# Patient Record
Sex: Female | Born: 1945 | Race: White | Hispanic: No | Smoking: Never smoker
Health system: Southern US, Community
[De-identification: ages and names within clinical notes are randomized; demographics above are authoritative.]

## PROBLEM LIST (undated history)

## (undated) DIAGNOSIS — E079 Disorder of thyroid, unspecified: Secondary | ICD-10-CM

## (undated) DIAGNOSIS — Z9889 Other specified postprocedural states: Secondary | ICD-10-CM

## (undated) DIAGNOSIS — K635 Polyp of colon: Secondary | ICD-10-CM

## (undated) DIAGNOSIS — T7840XA Allergy, unspecified, initial encounter: Secondary | ICD-10-CM

## (undated) DIAGNOSIS — R55 Syncope and collapse: Secondary | ICD-10-CM

## (undated) DIAGNOSIS — M1990 Unspecified osteoarthritis, unspecified site: Secondary | ICD-10-CM

## (undated) DIAGNOSIS — N390 Urinary tract infection, site not specified: Secondary | ICD-10-CM

## (undated) DIAGNOSIS — I10 Essential (primary) hypertension: Secondary | ICD-10-CM

## (undated) DIAGNOSIS — R112 Nausea with vomiting, unspecified: Secondary | ICD-10-CM

## (undated) HISTORY — PX: COLONOSCOPY: SHX174

## (undated) HISTORY — DX: Essential (primary) hypertension: I10

## (undated) HISTORY — DX: Allergy, unspecified, initial encounter: T78.40XA

## (undated) HISTORY — DX: Disorder of thyroid, unspecified: E07.9

## (undated) HISTORY — PX: SINUS IRRIGATION: SHX2411

## (undated) HISTORY — PX: TONSILLECTOMY: SUR1361

## (undated) HISTORY — DX: Syncope and collapse: R55

## (undated) HISTORY — DX: Polyp of colon: K63.5

## (undated) HISTORY — DX: Unspecified osteoarthritis, unspecified site: M19.90

## (undated) HISTORY — DX: Urinary tract infection, site not specified: N39.0

---

## 1999-03-07 ENCOUNTER — Other Ambulatory Visit: Admission: RE | Admit: 1999-03-07 | Discharge: 1999-03-07 | Payer: Self-pay | Admitting: Obstetrics and Gynecology

## 2000-06-03 ENCOUNTER — Other Ambulatory Visit: Admission: RE | Admit: 2000-06-03 | Discharge: 2000-06-03 | Payer: Self-pay | Admitting: Obstetrics and Gynecology

## 2001-03-30 ENCOUNTER — Encounter: Payer: Self-pay | Admitting: Emergency Medicine

## 2001-03-30 ENCOUNTER — Emergency Department (HOSPITAL_COMMUNITY): Admission: EM | Admit: 2001-03-30 | Discharge: 2001-03-30 | Payer: Self-pay | Admitting: Plastic Surgery

## 2001-04-02 ENCOUNTER — Emergency Department (HOSPITAL_COMMUNITY): Admission: EM | Admit: 2001-04-02 | Discharge: 2001-04-02 | Payer: Self-pay | Admitting: Emergency Medicine

## 2007-04-29 ENCOUNTER — Emergency Department (HOSPITAL_COMMUNITY): Admission: EM | Admit: 2007-04-29 | Discharge: 2007-04-29 | Payer: Self-pay | Admitting: Emergency Medicine

## 2007-06-04 ENCOUNTER — Encounter (INDEPENDENT_AMBULATORY_CARE_PROVIDER_SITE_OTHER): Payer: Self-pay | Admitting: Diagnostic Radiology

## 2007-06-04 ENCOUNTER — Encounter: Admission: RE | Admit: 2007-06-04 | Discharge: 2007-06-04 | Payer: Self-pay | Admitting: Family Medicine

## 2007-06-04 ENCOUNTER — Other Ambulatory Visit: Admission: RE | Admit: 2007-06-04 | Discharge: 2007-06-04 | Payer: Self-pay | Admitting: Diagnostic Radiology

## 2009-03-03 ENCOUNTER — Encounter: Admission: RE | Admit: 2009-03-03 | Discharge: 2009-03-03 | Payer: Self-pay | Admitting: Family Medicine

## 2009-12-21 ENCOUNTER — Encounter: Admission: RE | Admit: 2009-12-21 | Discharge: 2009-12-21 | Payer: Self-pay | Admitting: Family Medicine

## 2010-03-20 ENCOUNTER — Encounter: Admission: RE | Admit: 2010-03-20 | Discharge: 2010-03-20 | Payer: Self-pay | Admitting: Family Medicine

## 2010-05-12 ENCOUNTER — Other Ambulatory Visit: Payer: Self-pay | Admitting: Gastroenterology

## 2010-05-12 DIAGNOSIS — K862 Cyst of pancreas: Secondary | ICD-10-CM

## 2010-05-23 ENCOUNTER — Ambulatory Visit
Admission: RE | Admit: 2010-05-23 | Discharge: 2010-05-23 | Disposition: A | Discharge status: Home / Self Care | Payer: MEDICARE | Source: Ambulatory Visit | Attending: Gastroenterology | Admitting: Gastroenterology

## 2010-05-23 DIAGNOSIS — K862 Cyst of pancreas: Secondary | ICD-10-CM

## 2010-12-11 ENCOUNTER — Other Ambulatory Visit: Payer: Self-pay | Admitting: Obstetrics and Gynecology

## 2010-12-14 ENCOUNTER — Other Ambulatory Visit: Payer: Self-pay | Admitting: Obstetrics and Gynecology

## 2010-12-14 DIAGNOSIS — R928 Other abnormal and inconclusive findings on diagnostic imaging of breast: Secondary | ICD-10-CM

## 2010-12-20 ENCOUNTER — Ambulatory Visit
Admission: RE | Admit: 2010-12-20 | Discharge: 2010-12-20 | Disposition: A | Discharge status: Home / Self Care | Payer: Medicare Other | Source: Ambulatory Visit | Attending: Obstetrics and Gynecology | Admitting: Obstetrics and Gynecology

## 2010-12-20 DIAGNOSIS — R928 Other abnormal and inconclusive findings on diagnostic imaging of breast: Secondary | ICD-10-CM

## 2011-01-10 LAB — DIFFERENTIAL
Lymphs Abs: 0.8
Monocytes Absolute: 0.3
Monocytes Relative: 3
Neutro Abs: 9.5 — ABNORMAL HIGH
Neutrophils Relative %: 90 — ABNORMAL HIGH

## 2011-01-10 LAB — COMPREHENSIVE METABOLIC PANEL
AST: 28
Albumin: 3.5
Alkaline Phosphatase: 54
BUN: 12
Chloride: 101
Creatinine, Ser: 0.67
Potassium: 3.4 — ABNORMAL LOW
Total Bilirubin: 0.7
Total Protein: 6.7

## 2011-01-10 LAB — URINALYSIS, ROUTINE W REFLEX MICROSCOPIC
Bilirubin Urine: NEGATIVE
Glucose, UA: NEGATIVE
Hgb urine dipstick: NEGATIVE
Specific Gravity, Urine: 1.017

## 2011-01-10 LAB — CBC
Hemoglobin: 12.6
MCHC: 34.4
Platelets: 301
RDW: 12.4

## 2011-02-28 ENCOUNTER — Other Ambulatory Visit: Payer: Self-pay | Admitting: Family Medicine

## 2011-02-28 DIAGNOSIS — E049 Nontoxic goiter, unspecified: Secondary | ICD-10-CM

## 2011-03-19 ENCOUNTER — Ambulatory Visit
Admission: RE | Admit: 2011-03-19 | Discharge: 2011-03-19 | Disposition: A | Discharge status: Home / Self Care | Payer: Medicare Other | Source: Ambulatory Visit | Attending: Family Medicine | Admitting: Family Medicine

## 2011-03-19 DIAGNOSIS — E049 Nontoxic goiter, unspecified: Secondary | ICD-10-CM

## 2011-07-31 ENCOUNTER — Other Ambulatory Visit: Payer: Self-pay | Admitting: Gastroenterology

## 2011-07-31 DIAGNOSIS — K862 Cyst of pancreas: Secondary | ICD-10-CM

## 2011-08-27 ENCOUNTER — Ambulatory Visit
Admission: RE | Admit: 2011-08-27 | Discharge: 2011-08-27 | Disposition: A | Discharge status: Home / Self Care | Payer: Medicare Other | Source: Ambulatory Visit | Attending: Gastroenterology | Admitting: Gastroenterology

## 2011-08-27 DIAGNOSIS — K862 Cyst of pancreas: Secondary | ICD-10-CM

## 2011-08-27 MED ORDER — GADOBENATE DIMEGLUMINE 529 MG/ML IV SOLN
11.0000 mL | Freq: Once | INTRAVENOUS | Status: AC | PRN
  Administered 2011-08-27: 11 mL via INTRAVENOUS

## 2012-01-28 ENCOUNTER — Other Ambulatory Visit (HOSPITAL_COMMUNITY)
Admission: RE | Admit: 2012-01-28 | Discharge: 2012-01-28 | Disposition: A | Discharge status: Home / Self Care | Payer: Medicare Other | Source: Ambulatory Visit | Attending: Family Medicine | Admitting: Family Medicine

## 2012-01-28 ENCOUNTER — Other Ambulatory Visit: Payer: Self-pay | Admitting: Family Medicine

## 2012-01-28 DIAGNOSIS — Z124 Encounter for screening for malignant neoplasm of cervix: Secondary | ICD-10-CM | POA: Insufficient documentation

## 2012-02-07 ENCOUNTER — Other Ambulatory Visit: Payer: Self-pay | Admitting: Family Medicine

## 2012-02-07 DIAGNOSIS — Z1231 Encounter for screening mammogram for malignant neoplasm of breast: Secondary | ICD-10-CM

## 2012-02-07 DIAGNOSIS — M8580 Other specified disorders of bone density and structure, unspecified site: Secondary | ICD-10-CM

## 2012-03-12 ENCOUNTER — Ambulatory Visit
Admission: RE | Admit: 2012-03-12 | Discharge: 2012-03-12 | Disposition: A | Discharge status: Home / Self Care | Payer: Medicare Other | Source: Ambulatory Visit | Attending: Family Medicine | Admitting: Family Medicine

## 2012-03-12 DIAGNOSIS — Z1231 Encounter for screening mammogram for malignant neoplasm of breast: Secondary | ICD-10-CM

## 2012-03-12 DIAGNOSIS — M8580 Other specified disorders of bone density and structure, unspecified site: Secondary | ICD-10-CM

## 2012-03-18 ENCOUNTER — Other Ambulatory Visit: Payer: Self-pay | Admitting: Family Medicine

## 2012-03-18 DIAGNOSIS — R928 Other abnormal and inconclusive findings on diagnostic imaging of breast: Secondary | ICD-10-CM

## 2012-03-31 ENCOUNTER — Ambulatory Visit
Admission: RE | Admit: 2012-03-31 | Discharge: 2012-03-31 | Disposition: A | Discharge status: Home / Self Care | Payer: Medicare Other | Source: Ambulatory Visit | Attending: Family Medicine | Admitting: Family Medicine

## 2012-03-31 DIAGNOSIS — R928 Other abnormal and inconclusive findings on diagnostic imaging of breast: Secondary | ICD-10-CM

## 2013-02-23 ENCOUNTER — Other Ambulatory Visit: Payer: Self-pay | Admitting: Family Medicine

## 2013-02-23 DIAGNOSIS — N6323 Unspecified lump in the left breast, lower outer quadrant: Secondary | ICD-10-CM

## 2013-02-23 DIAGNOSIS — E049 Nontoxic goiter, unspecified: Secondary | ICD-10-CM

## 2013-03-15 ENCOUNTER — Ambulatory Visit
Admission: RE | Admit: 2013-03-15 | Discharge: 2013-03-15 | Disposition: A | Discharge status: Home / Self Care | Payer: Medicare Other | Source: Ambulatory Visit | Attending: Family Medicine | Admitting: Family Medicine

## 2013-03-15 DIAGNOSIS — N6323 Unspecified lump in the left breast, lower outer quadrant: Secondary | ICD-10-CM

## 2013-04-26 ENCOUNTER — Other Ambulatory Visit: Payer: Medicare Other

## 2013-05-24 ENCOUNTER — Other Ambulatory Visit: Payer: Medicare Other

## 2013-12-23 ENCOUNTER — Other Ambulatory Visit: Payer: Self-pay | Admitting: Family Medicine

## 2013-12-23 DIAGNOSIS — N6002 Solitary cyst of left breast: Secondary | ICD-10-CM

## 2014-03-28 ENCOUNTER — Ambulatory Visit
Admission: RE | Admit: 2014-03-28 | Discharge: 2014-03-28 | Disposition: A | Discharge status: Home / Self Care | Payer: Medicare HMO | Source: Ambulatory Visit | Attending: Family Medicine | Admitting: Family Medicine

## 2014-03-28 DIAGNOSIS — N6002 Solitary cyst of left breast: Secondary | ICD-10-CM

## 2014-09-02 ENCOUNTER — Ambulatory Visit (INDEPENDENT_AMBULATORY_CARE_PROVIDER_SITE_OTHER): Payer: Medicare HMO | Admitting: Internal Medicine

## 2014-09-02 ENCOUNTER — Encounter: Payer: Self-pay | Admitting: Internal Medicine

## 2014-09-02 VITALS — BP 110/80 | HR 66 | Temp 97.9°F | Resp 16 | Ht 64.0 in | Wt 128.0 lb

## 2014-09-02 DIAGNOSIS — R232 Flushing: Secondary | ICD-10-CM

## 2014-09-02 DIAGNOSIS — Z0000 Encounter for general adult medical examination without abnormal findings: Secondary | ICD-10-CM | POA: Insufficient documentation

## 2014-09-02 NOTE — Progress Notes (Signed)
Pre visit review using our clinic review tool, if applicable. No additional management support is needed unless otherwise documented below in the visit note. 

## 2014-09-02 NOTE — Patient Instructions (Signed)
We will get the records from your previous doctor and update our records.   No need for blood work today. We can see you back next year for a check up. If you have any problems or need refills before then please feel free to call the office.   Health Maintenance Adopting a healthy lifestyle and getting preventive care can go a long way to promote health and wellness. Talk with your health care provider about what schedule of regular examinations is right for you. This is a good chance for you to check in with your provider about disease prevention and staying healthy. In between checkups, there are plenty of things you can do on your own. Experts have done a lot of research about which lifestyle changes and preventive measures are most likely to keep you healthy. Ask your health care provider for more information. WEIGHT AND DIET  Eat a healthy diet  Be sure to include plenty of vegetables, fruits, low-fat dairy products, and lean protein.  Do not eat a lot of foods high in solid fats, added sugars, or salt.  Get regular exercise. This is one of the most important things you can do for your health.  Most adults should exercise for at least 150 minutes each week. The exercise should increase your heart rate and make you sweat (moderate-intensity exercise).  Most adults should also do strengthening exercises at least twice a week. This is in addition to the moderate-intensity exercise.  Maintain a healthy weight  Body mass index (BMI) is a measurement that can be used to identify possible weight problems. It estimates body fat based on height and weight. Your health care provider can help determine your BMI and help you achieve or maintain a healthy weight.  For females 76 years of age and older:   A BMI below 18.5 is considered underweight.  A BMI of 18.5 to 24.9 is normal.  A BMI of 25 to 29.9 is considered overweight.  A BMI of 30 and above is considered obese.  Watch levels of  cholesterol and blood lipids  You should start having your blood tested for lipids and cholesterol at 70 years of age, then have this test every 5 years.  You may need to have your cholesterol levels checked more often if:  Your lipid or cholesterol levels are high.  You are older than 70 years of age.  You are at high risk for heart disease.  CANCER SCREENING   Lung Cancer  Lung cancer screening is recommended for adults 47-27 years old who are at high risk for lung cancer because of a history of smoking.  A yearly low-dose CT scan of the lungs is recommended for people who:  Currently smoke.  Have quit within the past 15 years.  Have at least a 30-pack-year history of smoking. A pack year is smoking an average of one pack of cigarettes a day for 1 year.  Yearly screening should continue until it has been 15 years since you quit.  Yearly screening should stop if you develop a health problem that would prevent you from having lung cancer treatment.  Breast Cancer  Practice breast self-awareness. This means understanding how your breasts normally appear and feel.  It also means doing regular breast self-exams. Let your health care provider know about any changes, no matter how small.  If you are in your 20s or 30s, you should have a clinical breast exam (CBE) by a health care provider every 1-3 years  as part of a regular health exam.  If you are 65 or older, have a CBE every year. Also consider having a breast X-ray (mammogram) every year.  If you have a family history of breast cancer, talk to your health care provider about genetic screening.  If you are at high risk for breast cancer, talk to your health care provider about having an MRI and a mammogram every year.  Breast cancer gene (BRCA) assessment is recommended for women who have family members with BRCA-related cancers. BRCA-related cancers include:  Breast.  Ovarian.  Tubal.  Peritoneal  cancers.  Results of the assessment will determine the need for genetic counseling and BRCA1 and BRCA2 testing. Cervical Cancer Routine pelvic examinations to screen for cervical cancer are no longer recommended for nonpregnant women who are considered low risk for cancer of the pelvic organs (ovaries, uterus, and vagina) and who do not have symptoms. A pelvic examination may be necessary if you have symptoms including those associated with pelvic infections. Ask your health care provider if a screening pelvic exam is right for you.   The Pap test is the screening test for cervical cancer for women who are considered at risk.  If you had a hysterectomy for a problem that was not cancer or a condition that could lead to cancer, then you no longer need Pap tests.  If you are older than 65 years, and you have had normal Pap tests for the past 10 years, you no longer need to have Pap tests.  If you have had past treatment for cervical cancer or a condition that could lead to cancer, you need Pap tests and screening for cancer for at least 20 years after your treatment.  If you no longer get a Pap test, assess your risk factors if they change (such as having a new sexual partner). This can affect whether you should start being screened again.  Some women have medical problems that increase their chance of getting cervical cancer. If this is the case for you, your health care provider may recommend more frequent screening and Pap tests.  The human papillomavirus (HPV) test is another test that may be used for cervical cancer screening. The HPV test looks for the virus that can cause cell changes in the cervix. The cells collected during the Pap test can be tested for HPV.  The HPV test can be used to screen women 64 years of age and older. Getting tested for HPV can extend the interval between normal Pap tests from three to five years.  An HPV test also should be used to screen women of any age who  have unclear Pap test results.  After 70 years of age, women should have HPV testing as often as Pap tests.  Colorectal Cancer  This type of cancer can be detected and often prevented.  Routine colorectal cancer screening usually begins at 70 years of age and continues through 70 years of age.  Your health care provider may recommend screening at an earlier age if you have risk factors for colon cancer.  Your health care provider may also recommend using home test kits to check for hidden blood in the stool.  A small camera at the end of a tube can be used to examine your colon directly (sigmoidoscopy or colonoscopy). This is done to check for the earliest forms of colorectal cancer.  Routine screening usually begins at age 57.  Direct examination of the colon should be repeated every  5-10 years through 70 years of age. However, you may need to be screened more often if early forms of precancerous polyps or small growths are found. Skin Cancer  Check your skin from head to toe regularly.  Tell your health care provider about any new moles or changes in moles, especially if there is a change in a mole's shape or color.  Also tell your health care provider if you have a mole that is larger than the size of a pencil eraser.  Always use sunscreen. Apply sunscreen liberally and repeatedly throughout the day.  Protect yourself by wearing long sleeves, pants, a wide-brimmed hat, and sunglasses whenever you are outside. HEART DISEASE, DIABETES, AND HIGH BLOOD PRESSURE   Have your blood pressure checked at least every 1-2 years. High blood pressure causes heart disease and increases the risk of stroke.  If you are between 86 years and 74 years old, ask your health care provider if you should take aspirin to prevent strokes.  Have regular diabetes screenings. This involves taking a blood sample to check your fasting blood sugar level.  If you are at a normal weight and have a low risk for  diabetes, have this test once every three years after 70 years of age.  If you are overweight and have a high risk for diabetes, consider being tested at a younger age or more often. PREVENTING INFECTION  Hepatitis B  If you have a higher risk for hepatitis B, you should be screened for this virus. You are considered at high risk for hepatitis B if:  You were born in a country where hepatitis B is common. Ask your health care provider which countries are considered high risk.  Your parents were born in a high-risk country, and you have not been immunized against hepatitis B (hepatitis B vaccine).  You have HIV or AIDS.  You use needles to inject street drugs.  You live with someone who has hepatitis B.  You have had sex with someone who has hepatitis B.  You get hemodialysis treatment.  You take certain medicines for conditions, including cancer, organ transplantation, and autoimmune conditions. Hepatitis C  Blood testing is recommended for:  Everyone born from [birth year] through 1966.  Anyone with known risk factors for hepatitis C. Sexually transmitted infections (STIs)  You should be screened for sexually transmitted infections (STIs) including gonorrhea and chlamydia if:  You are sexually active and are younger than 70 years of age.  You are older than 70 years of age and your health care provider tells you that you are at risk for this type of infection.  Your sexual activity has changed since you were last screened and you are at an increased risk for chlamydia or gonorrhea. Ask your health care provider if you are at risk.  If you do not have HIV, but are at risk, it may be recommended that you take a prescription medicine daily to prevent HIV infection. This is called pre-exposure prophylaxis (PrEP). You are considered at risk if:  You are sexually active and do not regularly use condoms or know the HIV status of your partner(s).  You take drugs by injection.  You are  sexually active with a partner who has HIV. Talk with your health care provider about whether you are at high risk of being infected with HIV. If you choose to begin PrEP, you should first be tested for HIV. You should then be tested every 3 months for as long as you  are taking PrEP.  PREGNANCY   If you are premenopausal and you may become pregnant, ask your health care provider about preconception counseling.  If you may become pregnant, take 400 to 800 micrograms (mcg) of folic acid every day.  If you want to prevent pregnancy, talk to your health care provider about birth control (contraception). OSTEOPOROSIS AND MENOPAUSE   Osteoporosis is a disease in which the bones lose minerals and strength with aging. This can result in serious bone fractures. Your risk for osteoporosis can be identified using a bone density scan.  If you are 66 years of age or older, or if you are at risk for osteoporosis and fractures, ask your health care provider if you should be screened.  Ask your health care provider whether you should take a calcium or vitamin D supplement to lower your risk for osteoporosis.  Menopause may have certain physical symptoms and risks.  Hormone replacement therapy may reduce some of these symptoms and risks. Talk to your health care provider about whether hormone replacement therapy is right for you.  HOME CARE INSTRUCTIONS   Schedule regular health, dental, and eye exams.  Stay current with your immunizations.   Do not use any tobacco products including cigarettes, chewing tobacco, or electronic cigarettes.  If you are pregnant, do not drink alcohol.  If you are breastfeeding, limit how much and how often you drink alcohol.  Limit alcohol intake to no more than 1 drink per day for nonpregnant women. One drink equals 12 ounces of beer, 5 ounces of wine, or 1 ounces of hard liquor.  Do not use street drugs.  Do not share needles.  Ask your health care provider for  help if you need support or information about quitting drugs.  Tell your health care provider if you often feel depressed.  Tell your health care provider if you have ever been abused or do not feel safe at home. Document Released: 10/22/2010 Document Revised: 08/23/2013 Document Reviewed: 03/10/2013 [facility] Patient Information 2015 [place], [place]. This information is not intended to replace advice given to you by your health care provider. Make sure you discuss any questions you have with your health care provider.

## 2014-09-02 NOTE — Assessment & Plan Note (Signed)
Up to date on colon cancer screening, pneumonia shot given at previous doctor on 05/26/14. Will get records to update the rest. Talked with her about sun safety and skin cancer prevention.

## 2014-09-02 NOTE — Assessment & Plan Note (Signed)
Takes hormones about 3-4 days per week. Gets yearly mammogram.

## 2014-09-02 NOTE — Progress Notes (Signed)
   Subjective:    Patient ID: Margaret Craig, female    DOB: 07-09-1945, 69 y.o.   MRN: [id number]  HPI Here for medicare wellness, no new complaints. Please see A/P for status and treatment of chronic medical problems.   Diet: heart healthy Physical activity: active Depression/mood screen: negative Hearing: intact to whispered voice Visual acuity: grossly normal, performs annual eye exam  ADLs: capable Fall risk: none Home safety: good Cognitive evaluation: intact to orientation, naming, recall and repetition EOL planning: adv directives discussed  I have personally reviewed and have noted 1. The patient's medical and social history - reviewed today no changes 2. Their use of alcohol, tobacco or illicit drugs 3. Their current medications and supplements 4. The patient's functional ability including ADL's, fall risks, home safety risks and hearing or visual impairment. 5. Diet and physical activities 6. Evidence for depression or mood disorders 7. Care team reviewed and updated (available in snapshot)  Review of Systems  Constitutional: Negative for fever, activity change, appetite change, fatigue and unexpected weight change.  HENT: Negative.   Eyes: Negative.   Respiratory: Negative for cough, chest tightness, shortness of breath and wheezing.   Cardiovascular: Negative for chest pain, palpitations and leg swelling.  Gastrointestinal: Negative for nausea, abdominal pain, diarrhea, constipation and abdominal distention.  Musculoskeletal: Negative.   Skin: Negative.   Neurological: Negative.   Psychiatric/Behavioral: Negative.       Objective:   Physical Exam  Constitutional: She is oriented to person, place, and time. She appears well-developed and well-nourished.  HENT:  Head: Normocephalic and atraumatic.  Eyes: EOM are normal.  Neck: Normal range of motion.  Cardiovascular: Normal rate and regular rhythm.   No murmur heard. Pulmonary/Chest: Effort normal and  breath sounds normal. No respiratory distress. She has no wheezes.  Abdominal: Soft. Bowel sounds are normal. She exhibits no distension. There is no tenderness.  Musculoskeletal: She exhibits no edema.  Neurological: She is alert and oriented to person, place, and time. Coordination normal.  Skin: Skin is warm and dry.  Psychiatric: She has a normal mood and affect.   Filed Vitals:   09/02/14 0901  BP: 110/80  Pulse: 66  Temp: 97.9 F (36.6 C)  TempSrc: Oral  Resp: 16  Height: 5\' 4"  (1.626 m)  Weight: 128 lb (58.06 kg)  SpO2: 99%      Assessment & Plan:

## 2014-11-14 ENCOUNTER — Telehealth: Payer: Self-pay | Admitting: Internal Medicine

## 2014-11-14 NOTE — Telephone Encounter (Signed)
Received records from [place] [facility] forwarded 48 pages to Dr. Doug Sou 11/14/14 [facility]

## 2015-03-07 DIAGNOSIS — Z23 Encounter for immunization: Secondary | ICD-10-CM | POA: Diagnosis not present

## 2015-05-29 ENCOUNTER — Ambulatory Visit (INDEPENDENT_AMBULATORY_CARE_PROVIDER_SITE_OTHER): Payer: Medicare HMO | Admitting: Internal Medicine

## 2015-05-29 ENCOUNTER — Encounter: Payer: Self-pay | Admitting: Internal Medicine

## 2015-05-29 ENCOUNTER — Other Ambulatory Visit (INDEPENDENT_AMBULATORY_CARE_PROVIDER_SITE_OTHER): Payer: Medicare HMO

## 2015-05-29 VITALS — BP 118/70 | HR 66 | Temp 98.2°F | Resp 12 | Ht 64.0 in | Wt 110.0 lb

## 2015-05-29 DIAGNOSIS — Z0000 Encounter for general adult medical examination without abnormal findings: Secondary | ICD-10-CM

## 2015-05-29 LAB — COMPREHENSIVE METABOLIC PANEL
ALK PHOS: 46 U/L (ref 39–117)
ALT: 18 U/L (ref 0–35)
AST: 18 U/L (ref 0–37)
Albumin: 4.2 g/dL (ref 3.5–5.2)
BUN: 20 mg/dL (ref 6–23)
CO2: 30 mEq/L (ref 19–32)
Calcium: 10.1 mg/dL (ref 8.4–10.5)
Chloride: 104 mEq/L (ref 96–112)
Creatinine, Ser: 0.8 mg/dL (ref 0.40–1.20)
GFR: 75.28 mL/min (ref >60.00)
Glucose, Bld: 101 mg/dL — ABNORMAL HIGH (ref 70–99)
Potassium: 4.9 mEq/L (ref 3.5–5.1)
Sodium: 142 mEq/L (ref 135–145)
TOTAL PROTEIN: 7.1 g/dL (ref 6.0–8.3)
Total Bilirubin: 0.4 mg/dL (ref 0.2–1.2)

## 2015-05-29 LAB — LIPID PANEL
Cholesterol: 217 mg/dL — ABNORMAL HIGH (ref 0–200)
HDL: 90.9 mg/dL (ref >39.00)
LDL CALC: 112 mg/dL — AB (ref 0–99)
NONHDL: 126.37
Total CHOL/HDL Ratio: 2
Triglycerides: 74 mg/dL (ref 0.0–149.0)
VLDL: 14.8 mg/dL (ref 0.0–40.0)

## 2015-05-29 LAB — HEPATITIS C ANTIBODY: HCV Ab: NEGATIVE

## 2015-05-29 LAB — MAGNESIUM: Magnesium: 2.6 mg/dL — ABNORMAL HIGH (ref 1.5–2.5)

## 2015-05-29 NOTE — Progress Notes (Signed)
Pre visit review using our clinic review tool, if applicable. No additional management support is needed unless otherwise documented below in the visit note. 

## 2015-05-29 NOTE — Patient Instructions (Signed)
We are checking the blood work today and will call you back with the results.   Try taking a tums before bed to help with the cramps.   If you want to try something for sleep some people take a benadryl at bedtime or you can try melatonin over the counter. Either is safe to try for sleep.   Health Maintenance, Female Adopting a healthy lifestyle and getting preventive care can go a long way to promote health and wellness. Talk with your health care provider about what schedule of regular examinations is right for you. This is a good chance for you to check in with your provider about disease prevention and staying healthy. In between checkups, there are plenty of things you can do on your own. Experts have done a lot of research about which lifestyle changes and preventive measures are most likely to keep you healthy. Ask your health care provider for more information. WEIGHT AND DIET  Eat a healthy diet  Be sure to include plenty of vegetables, fruits, low-fat dairy products, and lean protein.  Do not eat a lot of foods high in solid fats, added sugars, or salt.  Get regular exercise. This is one of the most important things you can do for your health.  Most adults should exercise for at least 150 minutes each week. The exercise should increase your heart rate and make you sweat (moderate-intensity exercise).  Most adults should also do strengthening exercises at least twice a week. This is in addition to the moderate-intensity exercise.  Maintain a healthy weight  Body mass index (BMI) is a measurement that can be used to identify possible weight problems. It estimates body fat based on height and weight. Your health care provider can help determine your BMI and help you achieve or maintain a healthy weight.  For females 70 years of age and older:   A BMI below 18.5 is considered underweight.  A BMI of 18.5 to 24.9 is normal.  A BMI of 25 to 29.9 is considered overweight.  A BMI  of 30 and above is considered obese.  Watch levels of cholesterol and blood lipids  You should start having your blood tested for lipids and cholesterol at 71 years of age, then have this test every 5 years.  You may need to have your cholesterol levels checked more often if:  Your lipid or cholesterol levels are high.  You are older than 71 years of age.  You are at high risk for heart disease.  CANCER SCREENING   Lung Cancer  Lung cancer screening is recommended for adults 65-40 years old who are at high risk for lung cancer because of a history of smoking.  A yearly low-dose CT scan of the lungs is recommended for people who:  Currently smoke.  Have quit within the past 15 years.  Have at least a 30-pack-year history of smoking. A pack year is smoking an average of one pack of cigarettes a day for 1 year.  Yearly screening should continue until it has been 15 years since you quit.  Yearly screening should stop if you develop a health problem that would prevent you from having lung cancer treatment.  Breast Cancer  Practice breast self-awareness. This means understanding how your breasts normally appear and feel.  It also means doing regular breast self-exams. Let your health care provider know about any changes, no matter how small.  If you are in your 20s or 30s, you should have a  clinical breast exam (CBE) by a health care provider every 1-3 years as part of a regular health exam.  If you are 40 or older, have a CBE every year. Also consider having a breast X-ray (mammogram) every year.  If you have a family history of breast cancer, talk to your health care provider about genetic screening.  If you are at high risk for breast cancer, talk to your health care provider about having an MRI and a mammogram every year.  Breast cancer gene (BRCA) assessment is recommended for women who have family members with BRCA-related cancers. BRCA-related cancers  include:  Breast.  Ovarian.  Tubal.  Peritoneal cancers.  Results of the assessment will determine the need for genetic counseling and BRCA1 and BRCA2 testing. Cervical Cancer Your health care provider may recommend that you be screened regularly for cancer of the pelvic organs (ovaries, uterus, and vagina). This screening involves a pelvic examination, including checking for microscopic changes to the surface of your cervix (Pap test). You may be encouraged to have this screening done every 3 years, beginning at age 21.  For women ages 30-65, health care providers may recommend pelvic exams and Pap testing every 3 years, or they may recommend the Pap and pelvic exam, combined with testing for human papilloma virus (HPV), every 5 years. Some types of HPV increase your risk of cervical cancer. Testing for HPV may also be done on women of any age with unclear Pap test results.  Other health care providers may not recommend any screening for nonpregnant women who are considered low risk for pelvic cancer and who do not have symptoms. Ask your health care provider if a screening pelvic exam is right for you.  If you have had past treatment for cervical cancer or a condition that could lead to cancer, you need Pap tests and screening for cancer for at least 20 years after your treatment. If Pap tests have been discontinued, your risk factors (such as having a new sexual partner) need to be reassessed to determine if screening should resume. Some women have medical problems that increase the chance of getting cervical cancer. In these cases, your health care provider may recommend more frequent screening and Pap tests. Colorectal Cancer  This type of cancer can be detected and often prevented.  Routine colorectal cancer screening usually begins at 71 years of age and continues through 71 years of age.  Your health care provider may recommend screening at an earlier age if you have risk factors for  colon cancer.  Your health care provider may also recommend using home test kits to check for hidden blood in the stool.  A small camera at the end of a tube can be used to examine your colon directly (sigmoidoscopy or colonoscopy). This is done to check for the earliest forms of colorectal cancer.  Routine screening usually begins at age 50.  Direct examination of the colon should be repeated every 5-10 years through 71 years of age. However, you may need to be screened more often if early forms of precancerous polyps or small growths are found. Skin Cancer  Check your skin from head to toe regularly.  Tell your health care provider about any new moles or changes in moles, especially if there is a change in a mole's shape or color.  Also tell your health care provider if you have a mole that is larger than the size of a pencil eraser.  Always use sunscreen. Apply sunscreen liberally   and repeatedly throughout the day.  Protect yourself by wearing long sleeves, pants, a wide-brimmed hat, and sunglasses whenever you are outside. HEART DISEASE, DIABETES, AND HIGH BLOOD PRESSURE   High blood pressure causes heart disease and increases the risk of stroke. High blood pressure is more likely to develop in:  People who have blood pressure in the high end of the normal range (130-139/85-89 mm Hg).  People who are overweight or obese.  People who are African American.  If you are 68-18 years of age, have your blood pressure checked every 3-5 years. If you are 96 years of age or older, have your blood pressure checked every year. You should have your blood pressure measured twice--once when you are at a hospital or clinic, and once when you are not at a hospital or clinic. Record the average of the two measurements. To check your blood pressure when you are not at a hospital or clinic, you can use:  An automated blood pressure machine at a pharmacy.  A home blood pressure monitor.  If you  are between 52 years and 37 years old, ask your health care provider if you should take aspirin to prevent strokes.  Have regular diabetes screenings. This involves taking a blood sample to check your fasting blood sugar level.  If you are at a normal weight and have a low risk for diabetes, have this test once every three years after 71 years of age.  If you are overweight and have a high risk for diabetes, consider being tested at a younger age or more often. PREVENTING INFECTION  Hepatitis B  If you have a higher risk for hepatitis B, you should be screened for this virus. You are considered at high risk for hepatitis B if:  You were born in a country where hepatitis B is common. Ask your health care provider which countries are considered high risk.  Your parents were born in a high-risk country, and you have not been immunized against hepatitis B (hepatitis B vaccine).  You have HIV or AIDS.  You use needles to inject street drugs.  You live with someone who has hepatitis B.  You have had sex with someone who has hepatitis B.  You get hemodialysis treatment.  You take certain medicines for conditions, including cancer, organ transplantation, and autoimmune conditions. Hepatitis C  Blood testing is recommended for:  Everyone born from [birth year] through 1966.  Anyone with known risk factors for hepatitis C. Sexually transmitted infections (STIs)  You should be screened for sexually transmitted infections (STIs) including gonorrhea and chlamydia if:  You are sexually active and are younger than 71 years of age.  You are older than 71 years of age and your health care provider tells you that you are at risk for this type of infection.  Your sexual activity has changed since you were last screened and you are at an increased risk for chlamydia or gonorrhea. Ask your health care provider if you are at risk.  If you do not have HIV, but are at risk, it may be recommended that you  take a prescription medicine daily to prevent HIV infection. This is called pre-exposure prophylaxis (PrEP). You are considered at risk if:  You are sexually active and do not regularly use condoms or know the HIV status of your partner(s).  You take drugs by injection.  You are sexually active with a partner who has HIV. Talk with your health care provider about whether you are  at high risk of being infected with HIV. If you choose to begin PrEP, you should first be tested for HIV. You should then be tested every 3 months for as long as you are taking PrEP.  PREGNANCY   If you are premenopausal and you may become pregnant, ask your health care provider about preconception counseling.  If you may become pregnant, take 400 to 800 micrograms (mcg) of folic acid every day.  If you want to prevent pregnancy, talk to your health care provider about birth control (contraception). OSTEOPOROSIS AND MENOPAUSE   Osteoporosis is a disease in which the bones lose minerals and strength with aging. This can result in serious bone fractures. Your risk for osteoporosis can be identified using a bone density scan.  If you are 56 years of age or older, or if you are at risk for osteoporosis and fractures, ask your health care provider if you should be screened.  Ask your health care provider whether you should take a calcium or vitamin D supplement to lower your risk for osteoporosis.  Menopause may have certain physical symptoms and risks.  Hormone replacement therapy may reduce some of these symptoms and risks. Talk to your health care provider about whether hormone replacement therapy is right for you.  HOME CARE INSTRUCTIONS   Schedule regular health, dental, and eye exams.  Stay current with your immunizations.   Do not use any tobacco products including cigarettes, chewing tobacco, or electronic cigarettes.  If you are pregnant, do not drink alcohol.  If you are breastfeeding, limit how  much and how often you drink alcohol.  Limit alcohol intake to no more than 1 drink per day for nonpregnant women. One drink equals 12 ounces of beer, 5 ounces of wine, or 1 ounces of hard liquor.  Do not use street drugs.  Do not share needles.  Ask your health care provider for help if you need support or information about quitting drugs.  Tell your health care provider if you often feel depressed.  Tell your health care provider if you have ever been abused or do not feel safe at home.   This information is not intended to replace advice given to you by your health care provider. Make sure you discuss any questions you have with your health care provider.   Document Released: 10/22/2010 Document Revised: 04/29/2014 Document Reviewed: 03/10/2013 Elsevier Interactive Patient Education [employer].

## 2015-05-29 NOTE — Progress Notes (Signed)
   Subjective:    Patient ID: Margaret Craig, female    DOB: [birth year], 70 y.o.   MRN: [id number]  HPI Here for medicare wellness, no new complaints. Please see A/P for status and treatment of chronic medical problems. Her husband passed away over the last summer and she is still struggling to adjust.   Diet: heart healthy  Physical activity: sedentary Depression/mood screen: negative Hearing: intact to whispered voice Visual acuity: grossly normal, performs annual eye exam  ADLs: capable Fall risk: none Home safety: good Cognitive evaluation: intact to orientation, naming, recall and repetition EOL planning: adv directives discussed, in place  I have personally reviewed and have noted 1. The patient's medical and social history - reviewed today no changes 2. Their use of alcohol, tobacco or illicit drugs 3. Their current medications and supplements 4. The patient's functional ability including ADL's, fall risks, home safety risks and hearing or visual impairment. 5. Diet and physical activities 6. Evidence for depression or mood disorders 7. Care team reviewed and updated (available in snapshot)  Review of Systems  Constitutional: Negative for fever, activity change, appetite change, fatigue and unexpected weight change.  HENT: Negative.   Eyes: Negative.   Respiratory: Negative for cough, chest tightness, shortness of breath and wheezing.   Cardiovascular: Negative for chest pain, palpitations and leg swelling.  Gastrointestinal: Negative for nausea, abdominal pain, diarrhea, constipation and abdominal distention.  Musculoskeletal: Negative.   Skin: Negative.   Neurological: Negative.   Psychiatric/Behavioral: Negative.       Objective:   Physical Exam  Constitutional: She is oriented to person, place, and time. She appears well-developed and well-nourished.  Thin  HENT:  Head: Normocephalic and atraumatic.  Eyes: EOM are normal.  Neck: Normal range of motion.    Cardiovascular: Normal rate and regular rhythm.   No murmur heard. Pulmonary/Chest: Effort normal and breath sounds normal. No respiratory distress. She has no wheezes.  Abdominal: Soft. Bowel sounds are normal. She exhibits no distension. There is no tenderness.  Musculoskeletal: She exhibits no edema.  Neurological: She is alert and oriented to person, place, and time. Coordination normal.  Skin: Skin is warm and dry.  Psychiatric: She has a normal mood and affect.   Filed Vitals:   05/29/15 0933  BP: 118/70  Pulse: 66  Temp: 98.2 F (36.8 C)  TempSrc: Oral  Resp: 12  Height: 5\' 4"  (1.626 m)  Weight: 110 lb (49.896 kg)  SpO2: 98%      Assessment & Plan:

## 2015-05-29 NOTE — Assessment & Plan Note (Signed)
Talked to her about her weight and she is working on gaining some back (lost due to stress). Colonoscopy up to date and she will get mammogram in the next year. Declines any immunizations today but already had flu shot. Checking labs, counseled her about sun protection and she sees dermatology yearly. Given 10 year screening recommendations.

## 2015-05-30 ENCOUNTER — Telehealth: Payer: Self-pay | Admitting: General Practice

## 2015-05-30 NOTE — Telephone Encounter (Signed)
Tried to call patient at both numbers.  No answer and can't leave a message.  Lab results mailed to patient.

## 2015-06-22 ENCOUNTER — Other Ambulatory Visit: Payer: Self-pay

## 2015-06-22 DIAGNOSIS — Z1231 Encounter for screening mammogram for malignant neoplasm of breast: Secondary | ICD-10-CM

## 2015-08-01 ENCOUNTER — Ambulatory Visit
Admission: RE | Admit: 2015-08-01 | Discharge: 2015-08-01 | Disposition: A | Discharge status: Home / Self Care | Payer: Medicare HMO | Source: Ambulatory Visit

## 2015-08-01 DIAGNOSIS — Z1231 Encounter for screening mammogram for malignant neoplasm of breast: Secondary | ICD-10-CM | POA: Diagnosis not present

## 2015-08-03 DIAGNOSIS — R69 Illness, unspecified: Secondary | ICD-10-CM | POA: Diagnosis not present

## 2015-08-17 ENCOUNTER — Ambulatory Visit (INDEPENDENT_AMBULATORY_CARE_PROVIDER_SITE_OTHER): Payer: Medicare HMO | Admitting: Internal Medicine

## 2015-08-17 ENCOUNTER — Encounter: Payer: Self-pay | Admitting: Internal Medicine

## 2015-08-17 VITALS — BP 130/82 | HR 66 | Temp 97.9°F | Resp 14 | Ht 64.0 in | Wt 106.0 lb

## 2015-08-17 DIAGNOSIS — R1033 Periumbilical pain: Secondary | ICD-10-CM | POA: Diagnosis not present

## 2015-08-17 NOTE — Progress Notes (Signed)
Pre visit review using our clinic review tool, if applicable. No additional management support is needed unless otherwise documented below in the visit note. 

## 2015-08-17 NOTE — Patient Instructions (Signed)
We will get the CT scan of the stomach to see if there is any other cause for the pain other than the hernia that we felt.   We will call you back with the results.   Avoiding excessive lifting to prevent the hernia from growing.   Ventral Hernia A ventral hernia (also called an incisional hernia) is a hernia that occurs at the site of a previous surgical cut (incision) in the abdomen. The abdominal wall spans from your lower chest down to your pelvis. If the abdominal wall is weakened from a surgical incision, a hernia can occur. A hernia is a bulge of bowel or muscle tissue pushing out on the weakened part of the abdominal wall. Ventral hernias can get bigger from straining or lifting. Obese and older people are at higher risk for a ventral hernia. People who develop infections after surgery or require repeat incisions at the same site on the abdomen are also at increased risk. CAUSES  A ventral hernia occurs because of weakness in the abdominal wall at an incision site.  SYMPTOMS  Common symptoms include:  A visible bulge or lump on the abdominal wall.  Pain or tenderness around the lump.  Increased discomfort if you cough or make a sudden movement. If the hernia has blocked part of the intestine, a serious complication can occur (incarcerated or strangulated hernia). This can become a problem that requires emergency surgery because the blood flow to the blocked intestine may be cut off. Symptoms may include:  Feeling sick to your stomach (nauseous).  Throwing up (vomiting).  Stomach swelling (distention) or bloating.  Fever.  Rapid heartbeat. DIAGNOSIS  Your health care provider will take a medical history and perform a physical exam. Various tests may be ordered, such as:  Blood tests.  Urine tests.  Ultrasonography.  X-rays.  Computed tomography (CT). TREATMENT  Watchful waiting may be all that is needed for a smaller hernia that does not cause symptoms. Your health  care provider may recommend the use of a supportive belt (truss) that helps to keep the abdominal wall intact. For larger hernias or those that cause pain, surgery to repair the hernia is usually recommended. If a hernia becomes strangulated, emergency surgery needs to be done right away. HOME CARE INSTRUCTIONS  Avoid putting pressure or strain on the abdominal area.  Avoid heavy lifting.  Use good body positioning for physical tasks. Ask your health care provider about proper body positioning.  Use a supportive belt as directed by your health care provider.  Maintain a healthy weight.  Eat foods that are high in fiber, such as whole grains, fruits, and vegetables. Fiber helps prevent difficult bowel movements (constipation).  Drink enough fluids to keep your urine clear or pale yellow.  Follow up with your health care provider as directed. SEEK MEDICAL CARE IF:   Your hernia seems to be getting larger or more painful. SEEK IMMEDIATE MEDICAL CARE IF:   You have abdominal pain that is sudden and sharp.  Your pain becomes severe.  You have repeated vomiting.  You are sweating a lot.  You notice a rapid heartbeat.  You develop a fever. MAKE SURE YOU:   Understand these instructions.  Will watch your condition.  Will get help right away if you are not doing well or get worse.   This information is not intended to replace advice given to you by your health care provider. Make sure you discuss any questions you have with your health care  provider.   Document Released: 03/25/2012 Document Revised: 04/29/2014 Document Reviewed: 03/25/2012 Elsevier Interactive Patient Education [employer].

## 2015-08-19 DIAGNOSIS — R1033 Periumbilical pain: Secondary | ICD-10-CM | POA: Insufficient documentation

## 2015-08-19 NOTE — Progress Notes (Signed)
   Subjective:    Patient ID: Margaret Craig, female    DOB: 01/12/1946, 70 y.o.   MRN: [id number]  HPI The patient is a 70 YO female coming in for umbilical pain. She was doing some heavy lifting last week when she felt some tearing. She is having pain in the area since that time. No bulging with coughing or bending. No nausea or vomiting. No diarrhea or constipation. No skin rash. Has tried tylenol which was not helpful. Pain 6/10.   Review of Systems  Constitutional: Negative for fever, activity change, appetite change, fatigue and unexpected weight change.  HENT: Negative.   Eyes: Negative.   Respiratory: Negative for cough, chest tightness, shortness of breath and wheezing.   Cardiovascular: Negative for chest pain, palpitations and leg swelling.  Gastrointestinal: Positive for abdominal pain. Negative for nausea, diarrhea, constipation and abdominal distention.  Musculoskeletal: Negative.   Skin: Negative.   Neurological: Negative.   Psychiatric/Behavioral: Negative.       Objective:   Physical Exam  Constitutional: She is oriented to person, place, and time. She appears well-developed and well-nourished.  Thin  HENT:  Head: Normocephalic and atraumatic.  Eyes: EOM are normal.  Neck: Normal range of motion.  Cardiovascular: Normal rate and regular rhythm.   No murmur heard. Pulmonary/Chest: Effort normal and breath sounds normal. No respiratory distress. She has no wheezes.  Abdominal: Soft. Bowel sounds are normal. She exhibits no distension. There is tenderness.  Tenderness around the umbilicus and mild bulge with cough  Musculoskeletal: She exhibits no edema.  Neurological: She is alert and oriented to person, place, and time. Coordination normal.  Skin: Skin is warm and dry.  Psychiatric: She has a normal mood and affect.   Filed Vitals:   08/17/15 1519  BP: 130/82  Pulse: 66  Temp: 97.9 F (36.6 C)  TempSrc: Oral  Resp: 14  Height: 5\' 4"  (1.626 m)  Weight:  106 lb (48.081 kg)  SpO2: 99%      Assessment & Plan:

## 2015-08-19 NOTE — Assessment & Plan Note (Signed)
CT abdomen without contrast to evaluate for torn muscle versus hernia. No definite hernia on exam. Advised to use heating pad and aleve if needed for pain.

## 2015-08-30 ENCOUNTER — Ambulatory Visit
Admission: RE | Admit: 2015-08-30 | Discharge: 2015-08-30 | Disposition: A | Discharge status: Home / Self Care | Payer: Medicare HMO | Source: Ambulatory Visit | Attending: Internal Medicine | Admitting: Internal Medicine

## 2015-08-30 DIAGNOSIS — K439 Ventral hernia without obstruction or gangrene: Secondary | ICD-10-CM | POA: Diagnosis not present

## 2015-08-30 DIAGNOSIS — R1033 Periumbilical pain: Secondary | ICD-10-CM

## 2015-09-06 ENCOUNTER — Telehealth: Payer: Self-pay | Admitting: Internal Medicine

## 2015-09-06 NOTE — Telephone Encounter (Signed)
-----   Message from [facility] sent at 09/06/2015  4:45 PM EDT ----- Patient is calling for results. Please advise in Dr. Nathanial Millman absence, thanks.  ----- Message -----    From: Rad Results In Interface    Sent: 08/30/2015   8:40 AM      To: Hoyt Koch, MD

## 2015-09-06 NOTE — Telephone Encounter (Signed)
Pt called in and would like someone to give her call with her CT Scan results

## 2015-09-06 NOTE — Telephone Encounter (Signed)
Please inform patient her CT scan shows a small 2 cm hernia which appears previously existing and has not changed previous imaging. Otherwise there is no indication for her abdominal pain based on the CT scan.

## 2015-09-07 NOTE — Telephone Encounter (Signed)
Patient aware.

## 2015-09-08 ENCOUNTER — Ambulatory Visit (INDEPENDENT_AMBULATORY_CARE_PROVIDER_SITE_OTHER): Payer: Medicare HMO | Admitting: Internal Medicine

## 2015-09-08 ENCOUNTER — Encounter: Payer: Self-pay | Admitting: Internal Medicine

## 2015-09-08 VITALS — BP 102/62 | HR 75 | Temp 98.4°F | Ht 64.0 in | Wt 107.1 lb

## 2015-09-08 DIAGNOSIS — H6983 Other specified disorders of Eustachian tube, bilateral: Secondary | ICD-10-CM | POA: Diagnosis not present

## 2015-09-08 MED ORDER — METHYLPREDNISOLONE 4 MG PO TBPK: ORAL_TABLET | ORAL | Status: DC

## 2015-09-08 MED ORDER — FLUTICASONE PROPIONATE 50 MCG/ACT NA SUSP: 2.0000 | Freq: Every day | NASAL | Status: DC

## 2015-09-08 NOTE — Progress Notes (Signed)
Subjective:    Patient ID: Margaret Craig, female    DOB: 1945-10-12, 69 y.o.   MRN: [id number]  HPI She is here for an acute visit.   For the past week her ears have been clogged. She did have severe right ear pain initially, but that resolved.  She was outside doing a lot of yard work and was exposed to a lot of pollen and she wondered if that was the cause.  She started taking claritin and it has not helped.  She did fly over one month ago and had a similar feeling, but resolved after a day. She denies any ear pain at this time. She denies any discharge, change in hearing, sore throat, fevers or other cold symptoms. She has tried popping them on her own, but has not helped.   Medications and allergies reviewed with patient and updated if appropriate.  Patient Active Problem List   Diagnosis Date Noted  . Umbilical pain 123456  . Hot flashes 09/02/2014  . Routine general medical examination at a health care facility 09/02/2014    Current Outpatient Prescriptions on File Prior to Visit  Medication Sig Dispense Refill  . aspirin 81 MG tablet Take 81 mg by mouth daily.    . Calcium Carb-Cholecalciferol (CALCIUM 600 + D PO) Take by mouth.    . estradiol (ESTRACE) 0.5 MG tablet Take 0.5 mg by mouth daily.   12  . medroxyPROGESTERone (PROVERA) 2.5 MG tablet Take 2.5 mg by mouth daily.   12  . Multiple Vitamins-Minerals (MULTIVITAMIN WITH MINERALS) tablet Take 1 tablet by mouth daily.     No current facility-administered medications on file prior to visit.    Past Medical History  Diagnosis Date  . Fainting spell   . Thyroid disease   . UTI (urinary tract infection)     Past Surgical History  Procedure Laterality Date  . Tonsillectomy    . Sinus irrigation      Social History   Social History  . Marital Status: Married    Spouse Name: N/A  . Number of Children: N/A  . Years of Education: N/A   Social History Main Topics  . Smoking status: Never Smoker   .  Smokeless tobacco: None  . Alcohol Use: 3.6 oz/week    6 Standard drinks or equivalent per week  . Drug Use: None  . Sexual Activity: Not Asked   Other Topics Concern  . None   Social History Narrative    Family History  Problem Relation Age of Onset  . Mental illness Mother   . Alcohol abuse Son   . Mental illness Son   . Cancer Maternal Aunt     breast  . Heart disease Maternal Aunt   . Heart disease Paternal Aunt   . Alcohol abuse Paternal Uncle   . Cancer Maternal Grandmother     breast  . Heart disease Maternal Grandfather     Review of Systems  Constitutional: Negative for fever.  HENT: Negative for congestion, ear discharge, ear pain, hearing loss, sinus pressure and sore throat.        Objective:   Filed Vitals:   09/08/15 1304  BP: 102/62  Pulse: 75  Temp: 98.4 F (36.9 C)   Filed Weights   09/08/15 1304  Weight: 107 lb 2 oz (48.592 kg)   Body mass index is 18.38 kg/(m^2).   Physical Exam GENERAL APPEARANCE: Appears stated age, well appearing, NAD EYES: conjunctiva clear, no icterus  HEENT: hearing grossly normal, bilateral tympanic membranes and ear canals normal, oropharynx with no erythema, no thyromegaly, trachea midline, no cervical or supraclavicular lymphadenopathy EXTREMITIES: Without clubbing, cyanosis, or edema      Assessment & Plan:   Bilateral eustachian tube dysfunction Ear exam is normal Continue Claritin daily Start Flonase once daily Medrol Dosepak-take as directed. Discussed possible side effects and if she experiences any advised her to call or stop the medication gentley try to pop ear If no improvement she will let us know .

## 2015-09-08 NOTE — Patient Instructions (Signed)
Continue to take the claritin.  Start using flonase once a day and take the steroid as directed.    If there is no improvement or if you've any side effects from the medications please call.

## 2015-09-08 NOTE — Progress Notes (Signed)
Pre visit review using our clinic review tool, if applicable. No additional management support is needed unless otherwise documented below in the visit note. 

## 2015-09-20 ENCOUNTER — Telehealth: Payer: Self-pay

## 2015-09-20 DIAGNOSIS — H6980 Other specified disorders of Eustachian tube, unspecified ear: Secondary | ICD-10-CM

## 2015-09-20 NOTE — Telephone Encounter (Signed)
Patient called and said her ears still feel stopped up. She states you said if no better to call back and you could advise her on what to do next. Please follow up. Thank you.

## 2015-09-21 NOTE — Telephone Encounter (Signed)
I did not see her for this but can recommend sinus rinses with saline to help clear congestion. Continue taking flonase and can switch to zyrtec which is slightly stronger.

## 2015-09-21 NOTE — Telephone Encounter (Signed)
Patient would like to go to an ENT doctor and she said that you told her if her condition didn't improve that you would refer her. Please advise, thanks.

## 2015-09-21 NOTE — Telephone Encounter (Signed)
Referral ordered

## 2015-09-21 NOTE — Telephone Encounter (Signed)
Left message on voice mail informing patient on what she could try to help her ears.

## 2015-09-21 NOTE — Addendum Note (Signed)
Addended by: Binnie Rail on: 09/21/2015 04:28 PM   Modules accepted: Orders

## 2015-10-27 DIAGNOSIS — H938X3 Other specified disorders of ear, bilateral: Secondary | ICD-10-CM | POA: Insufficient documentation

## 2016-02-21 DIAGNOSIS — Z23 Encounter for immunization: Secondary | ICD-10-CM | POA: Diagnosis not present

## 2016-03-20 DIAGNOSIS — L814 Other melanin hyperpigmentation: Secondary | ICD-10-CM | POA: Diagnosis not present

## 2016-03-20 DIAGNOSIS — D3617 Benign neoplasm of peripheral nerves and autonomic nervous system of trunk, unspecified: Secondary | ICD-10-CM | POA: Diagnosis not present

## 2016-03-20 DIAGNOSIS — D2262 Melanocytic nevi of left upper limb, including shoulder: Secondary | ICD-10-CM | POA: Diagnosis not present

## 2016-03-20 DIAGNOSIS — L821 Other seborrheic keratosis: Secondary | ICD-10-CM | POA: Diagnosis not present

## 2016-03-20 DIAGNOSIS — D2271 Melanocytic nevi of right lower limb, including hip: Secondary | ICD-10-CM | POA: Diagnosis not present

## 2016-03-20 DIAGNOSIS — D1801 Hemangioma of skin and subcutaneous tissue: Secondary | ICD-10-CM | POA: Diagnosis not present

## 2016-03-20 DIAGNOSIS — D2272 Melanocytic nevi of left lower limb, including hip: Secondary | ICD-10-CM | POA: Diagnosis not present

## 2016-03-20 DIAGNOSIS — L570 Actinic keratosis: Secondary | ICD-10-CM | POA: Diagnosis not present

## 2016-03-20 DIAGNOSIS — L819 Disorder of pigmentation, unspecified: Secondary | ICD-10-CM | POA: Diagnosis not present

## 2016-03-20 DIAGNOSIS — D225 Melanocytic nevi of trunk: Secondary | ICD-10-CM | POA: Diagnosis not present

## 2016-03-20 DIAGNOSIS — D2261 Melanocytic nevi of right upper limb, including shoulder: Secondary | ICD-10-CM | POA: Diagnosis not present

## 2016-05-01 DIAGNOSIS — R69 Illness, unspecified: Secondary | ICD-10-CM | POA: Diagnosis not present

## 2016-05-29 ENCOUNTER — Ambulatory Visit (INDEPENDENT_AMBULATORY_CARE_PROVIDER_SITE_OTHER): Payer: Medicare HMO | Admitting: Internal Medicine

## 2016-05-29 ENCOUNTER — Encounter: Payer: Self-pay | Admitting: Internal Medicine

## 2016-05-29 ENCOUNTER — Other Ambulatory Visit (INDEPENDENT_AMBULATORY_CARE_PROVIDER_SITE_OTHER): Payer: Medicare HMO

## 2016-05-29 VITALS — BP 112/70 | HR 57 | Temp 97.7°F | Ht 64.0 in | Wt 108.0 lb

## 2016-05-29 DIAGNOSIS — Z0000 Encounter for general adult medical examination without abnormal findings: Secondary | ICD-10-CM

## 2016-05-29 DIAGNOSIS — R232 Flushing: Secondary | ICD-10-CM | POA: Diagnosis not present

## 2016-05-29 LAB — COMPREHENSIVE METABOLIC PANEL
ALT: 15 U/L (ref 0–35)
AST: 16 U/L (ref 0–37)
Albumin: 4.2 g/dL (ref 3.5–5.2)
Alkaline Phosphatase: 51 U/L (ref 39–117)
BUN: 19 mg/dL (ref 6–23)
CO2: 31 mEq/L (ref 19–32)
Calcium: 9.8 mg/dL (ref 8.4–10.5)
Chloride: 105 mEq/L (ref 96–112)
Creatinine, Ser: 0.77 mg/dL (ref 0.40–1.20)
GFR: 78.45 mL/min (ref >60.00)
GLUCOSE: 92 mg/dL (ref 70–99)
POTASSIUM: 4.6 meq/L (ref 3.5–5.1)
Sodium: 142 mEq/L (ref 135–145)
TOTAL PROTEIN: 7 g/dL (ref 6.0–8.3)
Total Bilirubin: 0.5 mg/dL (ref 0.2–1.2)

## 2016-05-29 LAB — LIPID PANEL
Cholesterol: 221 mg/dL — ABNORMAL HIGH (ref 0–200)
HDL: 90.1 mg/dL (ref >39.00)
LDL Cholesterol: 113 mg/dL — ABNORMAL HIGH (ref 0–99)
NONHDL: 130.47
Total CHOL/HDL Ratio: 2
Triglycerides: 87 mg/dL (ref 0.0–149.0)
VLDL: 17.4 mg/dL (ref 0.0–40.0)

## 2016-05-29 LAB — CBC
HEMATOCRIT: 40.7 % (ref 36.0–46.0)
HEMOGLOBIN: 13.7 g/dL (ref 12.0–15.0)
MCHC: 33.8 g/dL (ref 30.0–36.0)
MCV: 98.8 fl (ref 78.0–100.0)
Platelets: 257 10*3/uL (ref 150.0–400.0)
RBC: 4.11 Mil/uL (ref 3.87–5.11)
RDW: 12.6 % (ref 11.5–15.5)
WBC: 3.5 10*3/uL — ABNORMAL LOW (ref 4.0–10.5)

## 2016-05-29 NOTE — Progress Notes (Signed)
   Subjective:    Patient ID: GEET EHMAN, female    DOB: 10-31-1945, 71 y.o.   MRN: [id number]  HPI Here for medicare wellness and physical, no new complaints. Please see A/P for status and treatment of chronic medical problems.   Diet: heart healthy  Physical activity: active Depression/mood screen: negative Hearing: intact to whispered voice Visual acuity: grossly normal, performs annual eye exam  ADLs: capable Fall risk: none Home safety: good Cognitive evaluation: intact to orientation, naming, recall and repetition EOL planning: adv directives discussed  I have personally reviewed and have noted 1. The patient's medical and social history - reviewed today no changes 2. Their use of alcohol, tobacco or illicit drugs 3. Their current medications and supplements 4. The patient's functional ability including ADL's, fall risks, home safety risks and hearing or visual impairment. 5. Diet and physical activities 6. Evidence for depression or mood disorders 7. Care team reviewed and updated (available in snapshot)  Review of Systems  Constitutional: Negative.   HENT: Negative.   Eyes: Negative.   Respiratory: Negative for cough, chest tightness and shortness of breath.   Cardiovascular: Negative for chest pain, palpitations and leg swelling.  Gastrointestinal: Negative for abdominal distention, abdominal pain, constipation, diarrhea, nausea and vomiting.  Musculoskeletal: Negative.   Skin: Negative.   Neurological: Negative.   Psychiatric/Behavioral: Negative.       Objective:   Physical Exam  Constitutional: She is oriented to person, place, and time. She appears well-developed and well-nourished.  HENT:  Head: Normocephalic and atraumatic.  Eyes: EOM are normal.  Neck: Normal range of motion.  Cardiovascular: Normal rate and regular rhythm.   Pulmonary/Chest: Effort normal and breath sounds normal. No respiratory distress. She has no wheezes. She has no rales.    Abdominal: Soft. Bowel sounds are normal. She exhibits no distension. There is no tenderness. There is no rebound.  Musculoskeletal: She exhibits no edema.  Neurological: She is alert and oriented to person, place, and time. Coordination normal.  Skin: Skin is warm and dry.  Psychiatric: She has a normal mood and affect.   Vitals:   05/29/16 0934  BP: 112/70  Pulse: (!) 57  Temp: 97.7 F (36.5 C)  TempSrc: Oral  SpO2: 98%  Weight: 108 lb (49 kg)  Height: 5\' 4"  (1.626 m)      Assessment & Plan:

## 2016-05-29 NOTE — Patient Instructions (Signed)
We will check the labs today and call you back.   The shingrix is the new shingles shot so watch for this to be available and consider getting if insurance will cover.   Health Maintenance, Female Introduction Adopting a healthy lifestyle and getting preventive care can go a long way to promote health and wellness. Talk with your health care provider about what schedule of regular examinations is right for you. This is a good chance for you to check in with your provider about disease prevention and staying healthy. In between checkups, there are plenty of things you can do on your own. Experts have done a lot of research about which lifestyle changes and preventive measures are most likely to keep you healthy. Ask your health care provider for more information. Weight and diet Eat a healthy diet  Be sure to include plenty of vegetables, fruits, low-fat dairy products, and lean protein.  Do not eat a lot of foods high in solid fats, added sugars, or salt.  Get regular exercise. This is one of the most important things you can do for your health.  Most adults should exercise for at least 150 minutes each week. The exercise should increase your heart rate and make you sweat (moderate-intensity exercise).  Most adults should also do strengthening exercises at least twice a week. This is in addition to the moderate-intensity exercise. Maintain a healthy weight  Body mass index (BMI) is a measurement that can be used to identify possible weight problems. It estimates body fat based on height and weight. Your health care provider can help determine your BMI and help you achieve or maintain a healthy weight.  For females 10 years of age and older:  A BMI below 18.5 is considered underweight.  A BMI of 18.5 to 24.9 is normal.  A BMI of 25 to 29.9 is considered overweight.  A BMI of 30 and above is considered obese. Watch levels of cholesterol and blood lipids  You should start having your  blood tested for lipids and cholesterol at 72 years of age, then have this test every 5 years.  You may need to have your cholesterol levels checked more often if:  Your lipid or cholesterol levels are high.  You are older than 72 years of age.  You are at high risk for heart disease. Cancer screening Lung Cancer  Lung cancer screening is recommended for adults 75-99 years old who are at high risk for lung cancer because of a history of smoking.  A yearly low-dose CT scan of the lungs is recommended for people who:  Currently smoke.  Have quit within the past 15 years.  Have at least a 30-pack-year history of smoking. A pack year is smoking an average of one pack of cigarettes a day for 1 year.  Yearly screening should continue until it has been 15 years since you quit.  Yearly screening should stop if you develop a health problem that would prevent you from having lung cancer treatment. Breast Cancer  Practice breast self-awareness. This means understanding how your breasts normally appear and feel.  It also means doing regular breast self-exams. Let your health care provider know about any changes, no matter how small.  If you are in your 20s or 30s, you should have a clinical breast exam (CBE) by a health care provider every 1-3 years as part of a regular health exam.  If you are 61 or older, have a CBE every year. Also consider having  a breast X-ray (mammogram) every year.  If you have a family history of breast cancer, talk to your health care provider about genetic screening.  If you are at high risk for breast cancer, talk to your health care provider about having an MRI and a mammogram every year.  Breast cancer gene (BRCA) assessment is recommended for women who have family members with BRCA-related cancers. BRCA-related cancers include:  Breast.  Ovarian.  Tubal.  Peritoneal cancers.  Results of the assessment will determine the need for genetic counseling  and BRCA1 and BRCA2 testing. Cervical Cancer  Your health care provider may recommend that you be screened regularly for cancer of the pelvic organs (ovaries, uterus, and vagina). This screening involves a pelvic examination, including checking for microscopic changes to the surface of your cervix (Pap test). You may be encouraged to have this screening done every 3 years, beginning at age 77.  For women ages 7-65, health care providers may recommend pelvic exams and Pap testing every 3 years, or they may recommend the Pap and pelvic exam, combined with testing for human papilloma virus (HPV), every 5 years. Some types of HPV increase your risk of cervical cancer. Testing for HPV may also be done on women of any age with unclear Pap test results.  Other health care providers may not recommend any screening for nonpregnant women who are considered low risk for pelvic cancer and who do not have symptoms. Ask your health care provider if a screening pelvic exam is right for you.  If you have had past treatment for cervical cancer or a condition that could lead to cancer, you need Pap tests and screening for cancer for at least 20 years after your treatment. If Pap tests have been discontinued, your risk factors (such as having a new sexual partner) need to be reassessed to determine if screening should resume. Some women have medical problems that increase the chance of getting cervical cancer. In these cases, your health care provider may recommend more frequent screening and Pap tests. Colorectal Cancer  This type of cancer can be detected and often prevented.  Routine colorectal cancer screening usually begins at 72 years of age and continues through 72 years of age.  Your health care provider may recommend screening at an earlier age if you have risk factors for colon cancer.  Your health care provider may also recommend using home test kits to check for hidden blood in the stool.  A small  camera at the end of a tube can be used to examine your colon directly (sigmoidoscopy or colonoscopy). This is done to check for the earliest forms of colorectal cancer.  Routine screening usually begins at age 4.  Direct examination of the colon should be repeated every 5-10 years through 72 years of age. However, you may need to be screened more often if early forms of precancerous polyps or small growths are found. Skin Cancer  Check your skin from head to toe regularly.  Tell your health care provider about any new moles or changes in moles, especially if there is a change in a mole's shape or color.  Also tell your health care provider if you have a mole that is larger than the size of a pencil eraser.  Always use sunscreen. Apply sunscreen liberally and repeatedly throughout the day.  Protect yourself by wearing long sleeves, pants, a wide-brimmed hat, and sunglasses whenever you are outside. Heart disease, diabetes, and high blood pressure  High blood pressure  causes heart disease and increases the risk of stroke. High blood pressure is more likely to develop in:  People who have blood pressure in the high end of the normal range (130-139/85-89 mm Hg).  People who are overweight or obese.  People who are African American.  If you are 23-25 years of age, have your blood pressure checked every 3-5 years. If you are 58 years of age or older, have your blood pressure checked every year. You should have your blood pressure measured twice-once when you are at a hospital or clinic, and once when you are not at a hospital or clinic. Record the average of the two measurements. To check your blood pressure when you are not at a hospital or clinic, you can use:  An automated blood pressure machine at a pharmacy.  A home blood pressure monitor.  If you are between 46 years and 46 years old, ask your health care provider if you should take aspirin to prevent strokes.  Have regular  diabetes screenings. This involves taking a blood sample to check your fasting blood sugar level.  If you are at a normal weight and have a low risk for diabetes, have this test once every three years after 73 years of age.  If you are overweight and have a high risk for diabetes, consider being tested at a younger age or more often. Preventing infection Hepatitis B  If you have a higher risk for hepatitis B, you should be screened for this virus. You are considered at high risk for hepatitis B if:  You were born in a country where hepatitis B is common. Ask your health care provider which countries are considered high risk.  Your parents were born in a high-risk country, and you have not been immunized against hepatitis B (hepatitis B vaccine).  You have HIV or AIDS.  You use needles to inject street drugs.  You live with someone who has hepatitis B.  You have had sex with someone who has hepatitis B.  You get hemodialysis treatment.  You take certain medicines for conditions, including cancer, organ transplantation, and autoimmune conditions. Hepatitis C  Blood testing is recommended for:  Everyone born from [birth year] through 1966.  Anyone with known risk factors for hepatitis C. Sexually transmitted infections (STIs)  You should be screened for sexually transmitted infections (STIs) including gonorrhea and chlamydia if:  You are sexually active and are younger than 72 years of age.  You are older than 72 years of age and your health care provider tells you that you are at risk for this type of infection.  Your sexual activity has changed since you were last screened and you are at an increased risk for chlamydia or gonorrhea. Ask your health care provider if you are at risk.  If you do not have HIV, but are at risk, it may be recommended that you take a prescription medicine daily to prevent HIV infection. This is called pre-exposure prophylaxis (PrEP). You are considered at  risk if:  You are sexually active and do not regularly use condoms or know the HIV status of your partner(s).  You take drugs by injection.  You are sexually active with a partner who has HIV. Talk with your health care provider about whether you are at high risk of being infected with HIV. If you choose to begin PrEP, you should first be tested for HIV. You should then be tested every 3 months for as long as you are  taking PrEP. Pregnancy  If you are premenopausal and you may become pregnant, ask your health care provider about preconception counseling.  If you may become pregnant, take 400 to 800 micrograms (mcg) of folic acid every day.  If you want to prevent pregnancy, talk to your health care provider about birth control (contraception). Osteoporosis and menopause  Osteoporosis is a disease in which the bones lose minerals and strength with aging. This can result in serious bone fractures. Your risk for osteoporosis can be identified using a bone density scan.  If you are 8 years of age or older, or if you are at risk for osteoporosis and fractures, ask your health care provider if you should be screened.  Ask your health care provider whether you should take a calcium or vitamin D supplement to lower your risk for osteoporosis.  Menopause may have certain physical symptoms and risks.  Hormone replacement therapy may reduce some of these symptoms and risks. Talk to your health care provider about whether hormone replacement therapy is right for you. Follow these instructions at home:  Schedule regular health, dental, and eye exams.  Stay current with your immunizations.  Do not use any tobacco products including cigarettes, chewing tobacco, or electronic cigarettes.  If you are pregnant, do not drink alcohol.  If you are breastfeeding, limit how much and how often you drink alcohol.  Limit alcohol intake to no more than 1 drink per day for nonpregnant women. One drink  equals 12 ounces of beer, 5 ounces of wine, or 1 ounces of hard liquor.  Do not use street drugs.  Do not share needles.  Ask your health care provider for help if you need support or information about quitting drugs.  Tell your health care provider if you often feel depressed.  Tell your health care provider if you have ever been abused or do not feel safe at home. This information is not intended to replace advice given to you by your health care provider. Make sure you discuss any questions you have with your health care provider. Document Released: 10/22/2010 Document Revised: 09/14/2015 Document Reviewed: 01/10/2015  2017 [employer]

## 2016-05-29 NOTE — Assessment & Plan Note (Signed)
Checking labs, talked to her about new shingrix which is not available yet, the tetanus is up to date per her report although she cannot recall the exact year (she will check). Flu and pneumonia up to date. Counseled about sun safety and the dangers of distracted driving. Given 10 year screening recommendations.

## 2016-05-29 NOTE — Assessment & Plan Note (Signed)
Off hormones and minimal symptoms.

## 2016-05-29 NOTE — Progress Notes (Signed)
Pre visit review using our clinic review tool, if applicable. No additional management support is needed unless otherwise documented below in the visit note. 

## 2016-07-10 DIAGNOSIS — R69 Illness, unspecified: Secondary | ICD-10-CM | POA: Diagnosis not present

## 2016-08-12 DIAGNOSIS — Z0100 Encounter for examination of eyes and vision without abnormal findings: Secondary | ICD-10-CM | POA: Diagnosis not present

## 2016-09-03 ENCOUNTER — Other Ambulatory Visit: Payer: Self-pay | Admitting: Internal Medicine

## 2016-09-03 DIAGNOSIS — Z1231 Encounter for screening mammogram for malignant neoplasm of breast: Secondary | ICD-10-CM

## 2016-09-19 DIAGNOSIS — Z23 Encounter for immunization: Secondary | ICD-10-CM | POA: Diagnosis not present

## 2016-09-24 ENCOUNTER — Ambulatory Visit
Admission: RE | Admit: 2016-09-24 | Discharge: 2016-09-24 | Disposition: A | Discharge status: Home / Self Care | Payer: Medicare HMO | Source: Ambulatory Visit | Attending: Internal Medicine | Admitting: Internal Medicine

## 2016-09-24 DIAGNOSIS — Z1231 Encounter for screening mammogram for malignant neoplasm of breast: Secondary | ICD-10-CM

## 2016-12-11 DIAGNOSIS — R69 Illness, unspecified: Secondary | ICD-10-CM | POA: Diagnosis not present

## 2017-01-30 DIAGNOSIS — R69 Illness, unspecified: Secondary | ICD-10-CM | POA: Diagnosis not present

## 2017-05-14 DIAGNOSIS — D2261 Melanocytic nevi of right upper limb, including shoulder: Secondary | ICD-10-CM | POA: Diagnosis not present

## 2017-05-14 DIAGNOSIS — D2271 Melanocytic nevi of right lower limb, including hip: Secondary | ICD-10-CM | POA: Diagnosis not present

## 2017-05-14 DIAGNOSIS — D2262 Melanocytic nevi of left upper limb, including shoulder: Secondary | ICD-10-CM | POA: Diagnosis not present

## 2017-05-14 DIAGNOSIS — D225 Melanocytic nevi of trunk: Secondary | ICD-10-CM | POA: Diagnosis not present

## 2017-05-14 DIAGNOSIS — D3612 Benign neoplasm of peripheral nerves and autonomic nervous system, upper limb, including shoulder: Secondary | ICD-10-CM | POA: Diagnosis not present

## 2017-05-14 DIAGNOSIS — L821 Other seborrheic keratosis: Secondary | ICD-10-CM | POA: Diagnosis not present

## 2017-05-14 DIAGNOSIS — D3617 Benign neoplasm of peripheral nerves and autonomic nervous system of trunk, unspecified: Secondary | ICD-10-CM | POA: Diagnosis not present

## 2017-05-14 DIAGNOSIS — L570 Actinic keratosis: Secondary | ICD-10-CM | POA: Diagnosis not present

## 2017-05-14 DIAGNOSIS — L819 Disorder of pigmentation, unspecified: Secondary | ICD-10-CM | POA: Diagnosis not present

## 2017-05-14 DIAGNOSIS — D2272 Melanocytic nevi of left lower limb, including hip: Secondary | ICD-10-CM | POA: Diagnosis not present

## 2017-05-16 ENCOUNTER — Telehealth: Payer: Self-pay | Admitting: Internal Medicine

## 2017-05-16 NOTE — Telephone Encounter (Signed)
Patient requesting transfer from [place] to [place].

## 2017-05-16 NOTE — Telephone Encounter (Signed)
fine

## 2017-05-16 NOTE — Telephone Encounter (Signed)
Copied from [place] [id number]. Topic: General - Other >> May 16, 2017  1:44 PM Neva Seat wrote: Pt is wanting to change over to Dr. Quay Burow as her PCP.  She feels like Dr. Quay Burow is a better fit for her and more thorough with the visit and care.  Please call pt back to let her know if this was approved  at [phone].  She is also needing a CPE after Feb.

## 2017-05-17 NOTE — Telephone Encounter (Signed)
I can accept her

## 2017-05-19 NOTE — Telephone Encounter (Signed)
Patient is scheduled   

## 2017-06-29 DIAGNOSIS — M8580 Other specified disorders of bone density and structure, unspecified site: Secondary | ICD-10-CM | POA: Insufficient documentation

## 2017-06-29 DIAGNOSIS — M810 Age-related osteoporosis without current pathological fracture: Secondary | ICD-10-CM | POA: Insufficient documentation

## 2017-06-29 NOTE — Progress Notes (Signed)
Subjective:    Patient ID: Margaret Craig, female    DOB: 1945/09/09, 72 y.o.   MRN: [id number]  HPI Here for medicare wellness exam and annual physical exam.   I have personally reviewed and have noted 1.The patient's medical and social history 2.Their use of alcohol, tobacco or illicit drugs 3.Their current medications and supplements 4.The patient's functional ability including ADL's, fall risks, home                 safety risk and hearing or visual impairment. 5.Diet and physical activities 6.Evidence for depression or mood disorders 7.Care team reviewed  -   Eye, derm    Are there smokers in your home (other than you)? No  Risk Factors Exercise:   Walks daily Dietary issues discussed: well balanced  Vitamin and supplement use:  MVI only  Opiod use:  none Side effects from medication:  n/a Does medications benefits outweigh risks/side effects:   n/a  Cardiac risk factors: advanced age     .  Depression Screen  Have you felt down, depressed or hopeless? No  Have you felt little interest or pleasure in doing things?  No  Activities of Daily Living In your present state of health, do you have any difficulty performing the following activities?:  Driving? No Managing money?  No Feeding yourself? No Getting from bed to chair? No Climbing a flight of stairs? No Preparing food and eating?: No Bathing or showering? No Getting dressed: No Getting to/using the toilet? No Moving around from place to place: No In the past year have you fallen or had a near fall?: No   Are you sexually active?  No  Do you have more than one partner?  N/A  Hearing Difficulties: No Do you often ask people to speak up or repeat themselves? No Do you experience ringing or noises in your ears? yes Do you have difficulty understanding soft or whispered voices? No Vision:              Any change in vision:  no   Up to date with eye exam:   yes  Memory:  Do you feel that you have a problem with memory? No  Do you often misplace items? No  Do you feel safe at home?  Yes  Cognitive Testing  Alert, Orientated? Yes  Normal Appearance? Yes  Recall of three objects?  Yes  Can perform simple calculations? Yes  Displays appropriate judgment? Yes  Can read the correct time from a watch face? Yes   Advanced Directives have been discussed with the patient? Yes - in place      Medications and allergies reviewed with patient and updated if appropriate.  Patient Active Problem List   Diagnosis Date Noted  . Osteopenia 06/29/2017    Current Outpatient Medications on File Prior to Visit  Medication Sig Dispense Refill  . Multiple Vitamins-Minerals (MULTIVITAMIN WITH MINERALS) tablet Take 1 tablet by mouth daily.     No current facility-administered medications on file prior to visit.     Past Medical History:  Diagnosis Date  . Fainting spell   . Thyroid disease   . UTI (urinary tract infection)     Past Surgical History:  Procedure Laterality Date  . SINUS IRRIGATION    . TONSILLECTOMY      Social History   Socioeconomic History  . Marital status: Married    Spouse name: None  . Number of children: None  . Years  of education: None  . Highest education level: None  Social Needs  . Financial resource strain: None  . Food insecurity - worry: None  . Food insecurity - inability: None  . Transportation needs - medical: None  . Transportation needs - non-medical: None  Occupational History  . None  Tobacco Use  . Smoking status: Never Smoker  . Smokeless tobacco: Never Used  Substance and Sexual Activity  . Alcohol use: Yes    Alcohol/week: 3.6 oz    Types: 6 Standard drinks or equivalent per week  . Drug use: None  . Sexual activity: None  Other Topics Concern  . None  Social History Narrative  . None    Family History  Problem Relation Age of Onset  . Mental  illness Mother   . Colon cancer Mother   . Alcohol abuse Son   . Mental illness Son   . Cancer Maternal Aunt        breast  . Heart disease Maternal Aunt   . Heart disease Paternal Aunt   . Alcohol abuse Paternal Uncle   . Cancer Maternal Grandmother        breast  . Colon cancer Maternal Grandmother   . Heart disease Maternal Grandfather     Review of Systems  Constitutional: Negative for chills, fatigue and fever.  HENT: Positive for tinnitus. Negative for hearing loss.   Eyes: Negative for visual disturbance.  Respiratory: Negative for cough, shortness of breath and wheezing.   Cardiovascular: Positive for palpitations (occ, chronic). Negative for chest pain and leg swelling.  Gastrointestinal: Negative for abdominal pain, blood in stool, constipation, diarrhea and nausea.       No gerd  Genitourinary: Negative for dysuria and hematuria.  Musculoskeletal: Positive for back pain. Negative for arthralgias.  Neurological: Negative for light-headedness, numbness and headaches.  Psychiatric/Behavioral: Negative for sleep disturbance. The patient is not nervous/anxious.        Objective:   Vitals:   07/01/17 0921  BP: 114/82  Pulse: (!) 55  Resp: 16  Temp: 97.8 F (36.6 C)  SpO2: 98%   Filed Weights   07/01/17 0921  Weight: 108 lb (49 kg)   Body mass index is 18.54 kg/m.  Wt Readings from Last 3 Encounters:  07/01/17 108 lb (49 kg)  05/29/16 108 lb (49 kg)  09/08/15 107 lb 2 oz (48.6 kg)     Physical Exam Constitutional: She appears well-developed and well-nourished. No distress.  HENT:  Head: Normocephalic and atraumatic.  Right Ear: External ear normal. Normal ear canal and TM Left Ear: External ear normal.  Normal ear canal and TM Mouth/Throat: Oropharynx is clear and moist.  Eyes: Conjunctivae and EOM are normal.  Neck: Neck supple. No tracheal deviation present. No thyromegaly present.  No carotid bruit  Cardiovascular: Normal rate, regular rhythm and  normal heart sounds.   No murmur heard.  No edema. Pulmonary/Chest: Effort normal and breath sounds normal. No respiratory distress. She has no wheezes. She has no rales.  Breast: deferred Abdominal: Soft. She exhibits no distension. There is no tenderness.  Lymphadenopathy: She has no cervical adenopathy.  Skin: Skin is warm and dry. She is not diaphoretic.  Psychiatric: She has a normal mood and affect. Her behavior is normal.        Assessment & Plan:   Wellness Exam: Immunizations   tdap due - discussed, flu up to date, discussed shingrix, others up to date Colonoscopy  Up to date  Mammogram  Up to date  Gyn - no longer Dexa  Due, last done 2013 - osteopenia Eye exam  Up to date  Hearing loss  none Memory concerns/difficulties    none Independent of ADLs   fully Stressed the importance of regular exercise   Patient received copy of preventative screening tests/immunizations recommended for the next 5-10 years.   Physical exam: Screening blood work  ordered Immunizations   tdap due, flu up to date, discussed shingrix, others up to date Colonoscopy  Up to date  Mammogram   Up to date  50 - no longer sees Dexa  Due, last done 2013 - osteopenia - she will schedule this summer with mammo Eye exams    Up to date  EKG   None on file Exercise  Walks regularly, will try to add resistance training Weight  Normal BMI Skin   No concerns Substance abuse   none  See Problem List for Assessment and Plan of chronic medical problems.   FU in one year

## 2017-07-01 ENCOUNTER — Other Ambulatory Visit (INDEPENDENT_AMBULATORY_CARE_PROVIDER_SITE_OTHER): Payer: Medicare HMO

## 2017-07-01 ENCOUNTER — Ambulatory Visit (INDEPENDENT_AMBULATORY_CARE_PROVIDER_SITE_OTHER): Payer: Medicare HMO | Admitting: Internal Medicine

## 2017-07-01 ENCOUNTER — Encounter: Payer: Self-pay | Admitting: Internal Medicine

## 2017-07-01 VITALS — BP 114/82 | HR 55 | Temp 97.8°F | Resp 16 | Ht 64.0 in | Wt 108.0 lb

## 2017-07-01 DIAGNOSIS — Z0000 Encounter for general adult medical examination without abnormal findings: Secondary | ICD-10-CM | POA: Diagnosis not present

## 2017-07-01 DIAGNOSIS — M85852 Other specified disorders of bone density and structure, left thigh: Secondary | ICD-10-CM

## 2017-07-01 LAB — CBC WITH DIFFERENTIAL/PLATELET
BASOS PCT: 1.1 % (ref 0.0–3.0)
Basophils Absolute: 0 10*3/uL (ref 0.0–0.1)
EOS ABS: 0.1 10*3/uL (ref 0.0–0.7)
Eosinophils Relative: 2.2 % (ref 0.0–5.0)
HCT: 41.8 % (ref 36.0–46.0)
HEMOGLOBIN: 14.5 g/dL (ref 12.0–15.0)
Lymphocytes Relative: 36.7 % (ref 12.0–46.0)
Lymphs Abs: 1.5 10*3/uL (ref 0.7–4.0)
MCHC: 34.8 g/dL (ref 30.0–36.0)
MCV: 97.3 fl (ref 78.0–100.0)
MONOS PCT: 11.5 % (ref 3.0–12.0)
Monocytes Absolute: 0.5 10*3/uL (ref 0.1–1.0)
NEUTROS ABS: 2 10*3/uL (ref 1.4–7.7)
Neutrophils Relative %: 48.5 % (ref 43.0–77.0)
PLATELETS: 258 10*3/uL (ref 150.0–400.0)
RBC: 4.3 Mil/uL (ref 3.87–5.11)
RDW: 12.9 % (ref 11.5–15.5)
WBC: 4.1 10*3/uL (ref 4.0–10.5)

## 2017-07-01 LAB — COMPREHENSIVE METABOLIC PANEL
ALBUMIN: 4.3 g/dL (ref 3.5–5.2)
ALT: 15 U/L (ref 0–35)
AST: 13 U/L (ref 0–37)
Alkaline Phosphatase: 49 U/L (ref 39–117)
BUN: 18 mg/dL (ref 6–23)
CALCIUM: 10 mg/dL (ref 8.4–10.5)
CHLORIDE: 103 meq/L (ref 96–112)
CO2: 32 meq/L (ref 19–32)
Creatinine, Ser: 0.73 mg/dL (ref 0.40–1.20)
GFR: 83.18 mL/min (ref >60.00)
Glucose, Bld: 82 mg/dL (ref 70–99)
POTASSIUM: 5 meq/L (ref 3.5–5.1)
Sodium: 141 mEq/L (ref 135–145)
Total Bilirubin: 0.4 mg/dL (ref 0.2–1.2)
Total Protein: 7 g/dL (ref 6.0–8.3)

## 2017-07-01 LAB — LIPID PANEL
CHOL/HDL RATIO: 2
Cholesterol: 192 mg/dL (ref 0–200)
HDL: 88 mg/dL (ref >39.00)
LDL CALC: 90 mg/dL (ref 0–99)
NonHDL: 104.22
TRIGLYCERIDES: 71 mg/dL (ref 0.0–149.0)
VLDL: 14.2 mg/dL (ref 0.0–40.0)

## 2017-07-01 LAB — VITAMIN D 25 HYDROXY (VIT D DEFICIENCY, FRACTURES): VITD: 34.1 ng/mL (ref 30.00–100.00)

## 2017-07-01 LAB — TSH: TSH: 0.15 u[IU]/mL — ABNORMAL LOW (ref 0.35–4.50)

## 2017-07-01 NOTE — Patient Instructions (Addendum)
Make your bone density and mammogram appointment at the breast center.    Margaret Craig , Thank you for taking time to come for your Medicare Wellness Visit. I appreciate your ongoing commitment to your health goals. Please review the following plan we discussed and let me know if I can assist you in the future.   These are the goals we discussed: Goals    None      This is a list of the screening recommended for you and due dates:  Health Maintenance  Topic Date Due  . Tetanus Vaccine  01/04/1964  . DEXA scan (bone density measurement)  03/13/2015  . Mammogram  09/25/2018  . Colon Cancer Screening  08/21/2019  . Flu Shot  Completed  .  Hepatitis C: One time screening is recommended by Center for Disease Control  (CDC) for  adults born from [birth year] through 1965.   Completed  . Pneumonia vaccines  Completed     Test(s) ordered today. Your results will be released to [place] (or called to you) after review, usually within 72hours after test completion. If any changes need to be made, you will be notified at that same time.  All other Health Maintenance issues reviewed.   All recommended immunizations and age-appropriate screenings are up-to-date or discussed.  No immunizations administered today.   Medications reviewed and updated.   No changes recommended at this time.   Please followup in one year    Health Maintenance, Female Adopting a healthy lifestyle and getting preventive care can go a long way to promote health and wellness. Talk with your health care provider about what schedule of regular examinations is right for you. This is a good chance for you to check in with your provider about disease prevention and staying healthy. In between checkups, there are plenty of things you can do on your own. Experts have done a lot of research about which lifestyle changes and preventive measures are most likely to keep you healthy. Ask your health care provider for more  information. Weight and diet Eat a healthy diet  Be sure to include plenty of vegetables, fruits, low-fat dairy products, and lean protein.  Do not eat a lot of foods high in solid fats, added sugars, or salt.  Get regular exercise. This is one of the most important things you can do for your health. ? Most adults should exercise for at least 150 minutes each week. The exercise should increase your heart rate and make you sweat (moderate-intensity exercise). ? Most adults should also do strengthening exercises at least twice a week. This is in addition to the moderate-intensity exercise.  Maintain a healthy weight  Body mass index (BMI) is a measurement that can be used to identify possible weight problems. It estimates body fat based on height and weight. Your health care provider can help determine your BMI and help you achieve or maintain a healthy weight.  For females 24 years of age and older: ? A BMI below 18.5 is considered underweight. ? A BMI of 18.5 to 24.9 is normal. ? A BMI of 25 to 29.9 is considered overweight. ? A BMI of 30 and above is considered obese.  Watch levels of cholesterol and blood lipids  You should start having your blood tested for lipids and cholesterol at 73 years of age, then have this test every 5 years.  You may need to have your cholesterol levels checked more often if: ? Your lipid or cholesterol levels are  high. ? You are older than 73 years of age. ? You are at high risk for heart disease.  Cancer screening Lung Cancer  Lung cancer screening is recommended for adults 68-45 years old who are at high risk for lung cancer because of a history of smoking.  A yearly low-dose CT scan of the lungs is recommended for people who: ? Currently smoke. ? Have quit within the past 15 years. ? Have at least a 30-pack-year history of smoking. A pack year is smoking an average of one pack of cigarettes a day for 1 year.  Yearly screening should continue  until it has been 15 years since you quit.  Yearly screening should stop if you develop a health problem that would prevent you from having lung cancer treatment.  Breast Cancer  Practice breast self-awareness. This means understanding how your breasts normally appear and feel.  It also means doing regular breast self-exams. Let your health care provider know about any changes, no matter how small.  If you are in your 20s or 30s, you should have a clinical breast exam (CBE) by a health care provider every 1-3 years as part of a regular health exam.  If you are 13 or older, have a CBE every year. Also consider having a breast X-ray (mammogram) every year.  If you have a family history of breast cancer, talk to your health care provider about genetic screening.  If you are at high risk for breast cancer, talk to your health care provider about having an MRI and a mammogram every year.  Breast cancer gene (BRCA) assessment is recommended for women who have family members with BRCA-related cancers. BRCA-related cancers include: ? Breast. ? Ovarian. ? Tubal. ? Peritoneal cancers.  Results of the assessment will determine the need for genetic counseling and BRCA1 and BRCA2 testing.  Cervical Cancer Your health care provider may recommend that you be screened regularly for cancer of the pelvic organs (ovaries, uterus, and vagina). This screening involves a pelvic examination, including checking for microscopic changes to the surface of your cervix (Pap test). You may be encouraged to have this screening done every 3 years, beginning at age 62.  For women ages 17-65, health care providers may recommend pelvic exams and Pap testing every 3 years, or they may recommend the Pap and pelvic exam, combined with testing for human papilloma virus (HPV), every 5 years. Some types of HPV increase your risk of cervical cancer. Testing for HPV may also be done on women of any age with unclear Pap test  results.  Other health care providers may not recommend any screening for nonpregnant women who are considered low risk for pelvic cancer and who do not have symptoms. Ask your health care provider if a screening pelvic exam is right for you.  If you have had past treatment for cervical cancer or a condition that could lead to cancer, you need Pap tests and screening for cancer for at least 20 years after your treatment. If Pap tests have been discontinued, your risk factors (such as having a new sexual partner) need to be reassessed to determine if screening should resume. Some women have medical problems that increase the chance of getting cervical cancer. In these cases, your health care provider may recommend more frequent screening and Pap tests.  Colorectal Cancer  This type of cancer can be detected and often prevented.  Routine colorectal cancer screening usually begins at 73 years of age and continues through 73  years of age.  Your health care provider may recommend screening at an earlier age if you have risk factors for colon cancer.  Your health care provider may also recommend using home test kits to check for hidden blood in the stool.  A small camera at the end of a tube can be used to examine your colon directly (sigmoidoscopy or colonoscopy). This is done to check for the earliest forms of colorectal cancer.  Routine screening usually begins at age 70.  Direct examination of the colon should be repeated every 5-10 years through 73 years of age. However, you may need to be screened more often if early forms of precancerous polyps or small growths are found.  Skin Cancer  Check your skin from head to toe regularly.  Tell your health care provider about any new moles or changes in moles, especially if there is a change in a mole's shape or color.  Also tell your health care provider if you have a mole that is larger than the size of a pencil eraser.  Always use sunscreen.  Apply sunscreen liberally and repeatedly throughout the day.  Protect yourself by wearing long sleeves, pants, a wide-brimmed hat, and sunglasses whenever you are outside.  Heart disease, diabetes, and high blood pressure  High blood pressure causes heart disease and increases the risk of stroke. High blood pressure is more likely to develop in: ? People who have blood pressure in the high end of the normal range (130-139/85-89 mm Hg). ? People who are overweight or obese. ? People who are African American.  If you are 35-65 years of age, have your blood pressure checked every 3-5 years. If you are 44 years of age or older, have your blood pressure checked every year. You should have your blood pressure measured twice-once when you are at a hospital or clinic, and once when you are not at a hospital or clinic. Record the average of the two measurements. To check your blood pressure when you are not at a hospital or clinic, you can use: ? An automated blood pressure machine at a pharmacy. ? A home blood pressure monitor.  If you are between 44 years and 25 years old, ask your health care provider if you should take aspirin to prevent strokes.  Have regular diabetes screenings. This involves taking a blood sample to check your fasting blood sugar level. ? If you are at a normal weight and have a low risk for diabetes, have this test once every three years after 73 years of age. ? If you are overweight and have a high risk for diabetes, consider being tested at a younger age or more often. Preventing infection Hepatitis B  If you have a higher risk for hepatitis B, you should be screened for this virus. You are considered at high risk for hepatitis B if: ? You were born in a country where hepatitis B is common. Ask your health care provider which countries are considered high risk. ? Your parents were born in a high-risk country, and you have not been immunized against hepatitis B (hepatitis B  vaccine). ? You have HIV or AIDS. ? You use needles to inject street drugs. ? You live with someone who has hepatitis B. ? You have had sex with someone who has hepatitis B. ? You get hemodialysis treatment. ? You take certain medicines for conditions, including cancer, organ transplantation, and autoimmune conditions.  Hepatitis C  Blood testing is recommended for: ? Everyone  born from [birth year] through 1965. ? Anyone with known risk factors for hepatitis C.  Sexually transmitted infections (STIs)  You should be screened for sexually transmitted infections (STIs) including gonorrhea and chlamydia if: ? You are sexually active and are younger than 73 years of age. ? You are older than 74 years of age and your health care provider tells you that you are at risk for this type of infection. ? Your sexual activity has changed since you were last screened and you are at an increased risk for chlamydia or gonorrhea. Ask your health care provider if you are at risk.  If you do not have HIV, but are at risk, it may be recommended that you take a prescription medicine daily to prevent HIV infection. This is called pre-exposure prophylaxis (PrEP). You are considered at risk if: ? You are sexually active and do not regularly use condoms or know the HIV status of your partner(s). ? You take drugs by injection. ? You are sexually active with a partner who has HIV.  Talk with your health care provider about whether you are at high risk of being infected with HIV. If you choose to begin PrEP, you should first be tested for HIV. You should then be tested every 3 months for as long as you are taking PrEP. Pregnancy  If you are premenopausal and you may become pregnant, ask your health care provider about preconception counseling.  If you may become pregnant, take 400 to 800 micrograms (mcg) of folic acid every day.  If you want to prevent pregnancy, talk to your health care provider about birth control  (contraception). Osteoporosis and menopause  Osteoporosis is a disease in which the bones lose minerals and strength with aging. This can result in serious bone fractures. Your risk for osteoporosis can be identified using a bone density scan.  If you are 5 years of age or older, or if you are at risk for osteoporosis and fractures, ask your health care provider if you should be screened.  Ask your health care provider whether you should take a calcium or vitamin D supplement to lower your risk for osteoporosis.  Menopause may have certain physical symptoms and risks.  Hormone replacement therapy may reduce some of these symptoms and risks. Talk to your health care provider about whether hormone replacement therapy is right for you. Follow these instructions at home:  Schedule regular health, dental, and eye exams.  Stay current with your immunizations.  Do not use any tobacco products including cigarettes, chewing tobacco, or electronic cigarettes.  If you are pregnant, do not drink alcohol.  If you are breastfeeding, limit how much and how often you drink alcohol.  Limit alcohol intake to no more than 1 drink per day for nonpregnant women. One drink equals 12 ounces of beer, 5 ounces of wine, or 1 ounces of hard liquor.  Do not use street drugs.  Do not share needles.  Ask your health care provider for help if you need support or information about quitting drugs.  Tell your health care provider if you often feel depressed.  Tell your health care provider if you have ever been abused or do not feel safe at home. This information is not intended to replace advice given to you by your health care provider. Make sure you discuss any questions you have with your health care provider. Document Released: 10/22/2010 Document Revised: 09/14/2015 Document Reviewed: 01/10/2015 Elsevier Interactive Patient Education  [employer].

## 2017-07-01 NOTE — Assessment & Plan Note (Signed)
Walks regularly Will try to add resistance training Taking a MVI Check D level Will schedule dexa for this summer when she gets mammo

## 2017-07-04 ENCOUNTER — Other Ambulatory Visit: Payer: Self-pay | Admitting: Internal Medicine

## 2017-07-04 DIAGNOSIS — R7989 Other specified abnormal findings of blood chemistry: Secondary | ICD-10-CM

## 2017-07-31 DIAGNOSIS — R69 Illness, unspecified: Secondary | ICD-10-CM | POA: Diagnosis not present

## 2017-10-09 ENCOUNTER — Other Ambulatory Visit: Payer: Self-pay | Admitting: Internal Medicine

## 2017-10-09 DIAGNOSIS — Z1231 Encounter for screening mammogram for malignant neoplasm of breast: Secondary | ICD-10-CM

## 2017-10-13 ENCOUNTER — Ambulatory Visit
Admission: RE | Admit: 2017-10-13 | Discharge: 2017-10-13 | Disposition: A | Discharge status: Home / Self Care | Payer: Medicare HMO | Source: Ambulatory Visit | Attending: Internal Medicine | Admitting: Internal Medicine

## 2017-10-13 DIAGNOSIS — Z1231 Encounter for screening mammogram for malignant neoplasm of breast: Secondary | ICD-10-CM

## 2017-10-15 ENCOUNTER — Other Ambulatory Visit: Payer: Self-pay | Admitting: Internal Medicine

## 2017-10-15 DIAGNOSIS — R928 Other abnormal and inconclusive findings on diagnostic imaging of breast: Secondary | ICD-10-CM

## 2017-10-20 ENCOUNTER — Ambulatory Visit: Payer: Medicare HMO

## 2017-10-20 ENCOUNTER — Ambulatory Visit
Admission: RE | Admit: 2017-10-20 | Discharge: 2017-10-20 | Disposition: A | Discharge status: Home / Self Care | Payer: Medicare HMO | Source: Ambulatory Visit | Attending: Internal Medicine | Admitting: Internal Medicine

## 2017-10-20 DIAGNOSIS — R928 Other abnormal and inconclusive findings on diagnostic imaging of breast: Secondary | ICD-10-CM | POA: Diagnosis not present

## 2017-11-05 DIAGNOSIS — M9902 Segmental and somatic dysfunction of thoracic region: Secondary | ICD-10-CM | POA: Diagnosis not present

## 2017-11-05 DIAGNOSIS — M5417 Radiculopathy, lumbosacral region: Secondary | ICD-10-CM | POA: Diagnosis not present

## 2017-11-05 DIAGNOSIS — M5415 Radiculopathy, thoracolumbar region: Secondary | ICD-10-CM | POA: Diagnosis not present

## 2017-11-05 DIAGNOSIS — M9904 Segmental and somatic dysfunction of sacral region: Secondary | ICD-10-CM | POA: Diagnosis not present

## 2017-11-05 DIAGNOSIS — M5136 Other intervertebral disc degeneration, lumbar region: Secondary | ICD-10-CM | POA: Diagnosis not present

## 2017-11-05 DIAGNOSIS — M9903 Segmental and somatic dysfunction of lumbar region: Secondary | ICD-10-CM | POA: Diagnosis not present

## 2017-11-06 DIAGNOSIS — M9904 Segmental and somatic dysfunction of sacral region: Secondary | ICD-10-CM | POA: Diagnosis not present

## 2017-11-06 DIAGNOSIS — M9903 Segmental and somatic dysfunction of lumbar region: Secondary | ICD-10-CM | POA: Diagnosis not present

## 2017-11-06 DIAGNOSIS — M5415 Radiculopathy, thoracolumbar region: Secondary | ICD-10-CM | POA: Diagnosis not present

## 2017-11-06 DIAGNOSIS — M9902 Segmental and somatic dysfunction of thoracic region: Secondary | ICD-10-CM | POA: Diagnosis not present

## 2017-11-06 DIAGNOSIS — M5136 Other intervertebral disc degeneration, lumbar region: Secondary | ICD-10-CM | POA: Diagnosis not present

## 2017-11-06 DIAGNOSIS — M5417 Radiculopathy, lumbosacral region: Secondary | ICD-10-CM | POA: Diagnosis not present

## 2017-11-10 DIAGNOSIS — M5415 Radiculopathy, thoracolumbar region: Secondary | ICD-10-CM | POA: Diagnosis not present

## 2017-11-10 DIAGNOSIS — M9903 Segmental and somatic dysfunction of lumbar region: Secondary | ICD-10-CM | POA: Diagnosis not present

## 2017-11-10 DIAGNOSIS — M9902 Segmental and somatic dysfunction of thoracic region: Secondary | ICD-10-CM | POA: Diagnosis not present

## 2017-11-10 DIAGNOSIS — M5417 Radiculopathy, lumbosacral region: Secondary | ICD-10-CM | POA: Diagnosis not present

## 2017-11-10 DIAGNOSIS — M9904 Segmental and somatic dysfunction of sacral region: Secondary | ICD-10-CM | POA: Diagnosis not present

## 2017-11-10 DIAGNOSIS — M5136 Other intervertebral disc degeneration, lumbar region: Secondary | ICD-10-CM | POA: Diagnosis not present

## 2017-11-11 DIAGNOSIS — M9903 Segmental and somatic dysfunction of lumbar region: Secondary | ICD-10-CM | POA: Diagnosis not present

## 2017-11-11 DIAGNOSIS — M9904 Segmental and somatic dysfunction of sacral region: Secondary | ICD-10-CM | POA: Diagnosis not present

## 2017-11-11 DIAGNOSIS — M5415 Radiculopathy, thoracolumbar region: Secondary | ICD-10-CM | POA: Diagnosis not present

## 2017-11-11 DIAGNOSIS — M5136 Other intervertebral disc degeneration, lumbar region: Secondary | ICD-10-CM | POA: Diagnosis not present

## 2017-11-11 DIAGNOSIS — M5417 Radiculopathy, lumbosacral region: Secondary | ICD-10-CM | POA: Diagnosis not present

## 2017-11-11 DIAGNOSIS — M9902 Segmental and somatic dysfunction of thoracic region: Secondary | ICD-10-CM | POA: Diagnosis not present

## 2017-11-17 DIAGNOSIS — M5415 Radiculopathy, thoracolumbar region: Secondary | ICD-10-CM | POA: Diagnosis not present

## 2017-11-17 DIAGNOSIS — M5136 Other intervertebral disc degeneration, lumbar region: Secondary | ICD-10-CM | POA: Diagnosis not present

## 2017-11-17 DIAGNOSIS — M9902 Segmental and somatic dysfunction of thoracic region: Secondary | ICD-10-CM | POA: Diagnosis not present

## 2017-11-17 DIAGNOSIS — M9903 Segmental and somatic dysfunction of lumbar region: Secondary | ICD-10-CM | POA: Diagnosis not present

## 2017-11-17 DIAGNOSIS — M5417 Radiculopathy, lumbosacral region: Secondary | ICD-10-CM | POA: Diagnosis not present

## 2017-11-17 DIAGNOSIS — M9904 Segmental and somatic dysfunction of sacral region: Secondary | ICD-10-CM | POA: Diagnosis not present

## 2017-11-18 DIAGNOSIS — M5415 Radiculopathy, thoracolumbar region: Secondary | ICD-10-CM | POA: Diagnosis not present

## 2017-11-18 DIAGNOSIS — M9902 Segmental and somatic dysfunction of thoracic region: Secondary | ICD-10-CM | POA: Diagnosis not present

## 2017-11-18 DIAGNOSIS — M5136 Other intervertebral disc degeneration, lumbar region: Secondary | ICD-10-CM | POA: Diagnosis not present

## 2017-11-18 DIAGNOSIS — M9904 Segmental and somatic dysfunction of sacral region: Secondary | ICD-10-CM | POA: Diagnosis not present

## 2017-11-18 DIAGNOSIS — M5417 Radiculopathy, lumbosacral region: Secondary | ICD-10-CM | POA: Diagnosis not present

## 2017-11-18 DIAGNOSIS — M9903 Segmental and somatic dysfunction of lumbar region: Secondary | ICD-10-CM | POA: Diagnosis not present

## 2017-11-24 DIAGNOSIS — M5136 Other intervertebral disc degeneration, lumbar region: Secondary | ICD-10-CM | POA: Diagnosis not present

## 2017-11-24 DIAGNOSIS — M5415 Radiculopathy, thoracolumbar region: Secondary | ICD-10-CM | POA: Diagnosis not present

## 2017-11-24 DIAGNOSIS — M9904 Segmental and somatic dysfunction of sacral region: Secondary | ICD-10-CM | POA: Diagnosis not present

## 2017-11-24 DIAGNOSIS — M5417 Radiculopathy, lumbosacral region: Secondary | ICD-10-CM | POA: Diagnosis not present

## 2017-11-24 DIAGNOSIS — M9903 Segmental and somatic dysfunction of lumbar region: Secondary | ICD-10-CM | POA: Diagnosis not present

## 2017-11-24 DIAGNOSIS — M9902 Segmental and somatic dysfunction of thoracic region: Secondary | ICD-10-CM | POA: Diagnosis not present

## 2017-11-25 DIAGNOSIS — M5417 Radiculopathy, lumbosacral region: Secondary | ICD-10-CM | POA: Diagnosis not present

## 2017-11-25 DIAGNOSIS — M9903 Segmental and somatic dysfunction of lumbar region: Secondary | ICD-10-CM | POA: Diagnosis not present

## 2017-11-25 DIAGNOSIS — M9902 Segmental and somatic dysfunction of thoracic region: Secondary | ICD-10-CM | POA: Diagnosis not present

## 2017-11-25 DIAGNOSIS — M9904 Segmental and somatic dysfunction of sacral region: Secondary | ICD-10-CM | POA: Diagnosis not present

## 2017-11-25 DIAGNOSIS — M5415 Radiculopathy, thoracolumbar region: Secondary | ICD-10-CM | POA: Diagnosis not present

## 2017-11-25 DIAGNOSIS — M5136 Other intervertebral disc degeneration, lumbar region: Secondary | ICD-10-CM | POA: Diagnosis not present

## 2017-11-27 DIAGNOSIS — M5136 Other intervertebral disc degeneration, lumbar region: Secondary | ICD-10-CM | POA: Diagnosis not present

## 2017-11-27 DIAGNOSIS — M5417 Radiculopathy, lumbosacral region: Secondary | ICD-10-CM | POA: Diagnosis not present

## 2017-11-27 DIAGNOSIS — M9904 Segmental and somatic dysfunction of sacral region: Secondary | ICD-10-CM | POA: Diagnosis not present

## 2017-11-27 DIAGNOSIS — M9902 Segmental and somatic dysfunction of thoracic region: Secondary | ICD-10-CM | POA: Diagnosis not present

## 2017-11-27 DIAGNOSIS — M5415 Radiculopathy, thoracolumbar region: Secondary | ICD-10-CM | POA: Diagnosis not present

## 2017-11-27 DIAGNOSIS — M9903 Segmental and somatic dysfunction of lumbar region: Secondary | ICD-10-CM | POA: Diagnosis not present

## 2017-12-02 DIAGNOSIS — M9903 Segmental and somatic dysfunction of lumbar region: Secondary | ICD-10-CM | POA: Diagnosis not present

## 2017-12-02 DIAGNOSIS — M5136 Other intervertebral disc degeneration, lumbar region: Secondary | ICD-10-CM | POA: Diagnosis not present

## 2017-12-02 DIAGNOSIS — M5417 Radiculopathy, lumbosacral region: Secondary | ICD-10-CM | POA: Diagnosis not present

## 2017-12-02 DIAGNOSIS — M9904 Segmental and somatic dysfunction of sacral region: Secondary | ICD-10-CM | POA: Diagnosis not present

## 2017-12-02 DIAGNOSIS — M5415 Radiculopathy, thoracolumbar region: Secondary | ICD-10-CM | POA: Diagnosis not present

## 2017-12-02 DIAGNOSIS — M9902 Segmental and somatic dysfunction of thoracic region: Secondary | ICD-10-CM | POA: Diagnosis not present

## 2017-12-04 DIAGNOSIS — M9902 Segmental and somatic dysfunction of thoracic region: Secondary | ICD-10-CM | POA: Diagnosis not present

## 2017-12-04 DIAGNOSIS — M5417 Radiculopathy, lumbosacral region: Secondary | ICD-10-CM | POA: Diagnosis not present

## 2017-12-04 DIAGNOSIS — M9904 Segmental and somatic dysfunction of sacral region: Secondary | ICD-10-CM | POA: Diagnosis not present

## 2017-12-04 DIAGNOSIS — M5136 Other intervertebral disc degeneration, lumbar region: Secondary | ICD-10-CM | POA: Diagnosis not present

## 2017-12-04 DIAGNOSIS — M5415 Radiculopathy, thoracolumbar region: Secondary | ICD-10-CM | POA: Diagnosis not present

## 2017-12-04 DIAGNOSIS — M9903 Segmental and somatic dysfunction of lumbar region: Secondary | ICD-10-CM | POA: Diagnosis not present

## 2017-12-08 DIAGNOSIS — M9903 Segmental and somatic dysfunction of lumbar region: Secondary | ICD-10-CM | POA: Diagnosis not present

## 2017-12-08 DIAGNOSIS — M9902 Segmental and somatic dysfunction of thoracic region: Secondary | ICD-10-CM | POA: Diagnosis not present

## 2017-12-08 DIAGNOSIS — M5136 Other intervertebral disc degeneration, lumbar region: Secondary | ICD-10-CM | POA: Diagnosis not present

## 2017-12-08 DIAGNOSIS — M9904 Segmental and somatic dysfunction of sacral region: Secondary | ICD-10-CM | POA: Diagnosis not present

## 2017-12-08 DIAGNOSIS — M5415 Radiculopathy, thoracolumbar region: Secondary | ICD-10-CM | POA: Diagnosis not present

## 2017-12-08 DIAGNOSIS — M5417 Radiculopathy, lumbosacral region: Secondary | ICD-10-CM | POA: Diagnosis not present

## 2017-12-11 DIAGNOSIS — M9903 Segmental and somatic dysfunction of lumbar region: Secondary | ICD-10-CM | POA: Diagnosis not present

## 2017-12-11 DIAGNOSIS — M5417 Radiculopathy, lumbosacral region: Secondary | ICD-10-CM | POA: Diagnosis not present

## 2017-12-11 DIAGNOSIS — M5136 Other intervertebral disc degeneration, lumbar region: Secondary | ICD-10-CM | POA: Diagnosis not present

## 2017-12-11 DIAGNOSIS — M9904 Segmental and somatic dysfunction of sacral region: Secondary | ICD-10-CM | POA: Diagnosis not present

## 2017-12-11 DIAGNOSIS — M5415 Radiculopathy, thoracolumbar region: Secondary | ICD-10-CM | POA: Diagnosis not present

## 2017-12-11 DIAGNOSIS — M9902 Segmental and somatic dysfunction of thoracic region: Secondary | ICD-10-CM | POA: Diagnosis not present

## 2017-12-16 DIAGNOSIS — M5136 Other intervertebral disc degeneration, lumbar region: Secondary | ICD-10-CM | POA: Diagnosis not present

## 2017-12-16 DIAGNOSIS — M5415 Radiculopathy, thoracolumbar region: Secondary | ICD-10-CM | POA: Diagnosis not present

## 2017-12-16 DIAGNOSIS — M9902 Segmental and somatic dysfunction of thoracic region: Secondary | ICD-10-CM | POA: Diagnosis not present

## 2017-12-16 DIAGNOSIS — M9903 Segmental and somatic dysfunction of lumbar region: Secondary | ICD-10-CM | POA: Diagnosis not present

## 2017-12-16 DIAGNOSIS — M9904 Segmental and somatic dysfunction of sacral region: Secondary | ICD-10-CM | POA: Diagnosis not present

## 2017-12-16 DIAGNOSIS — M5417 Radiculopathy, lumbosacral region: Secondary | ICD-10-CM | POA: Diagnosis not present

## 2017-12-24 DIAGNOSIS — M5136 Other intervertebral disc degeneration, lumbar region: Secondary | ICD-10-CM | POA: Diagnosis not present

## 2017-12-24 DIAGNOSIS — M9904 Segmental and somatic dysfunction of sacral region: Secondary | ICD-10-CM | POA: Diagnosis not present

## 2017-12-24 DIAGNOSIS — M9903 Segmental and somatic dysfunction of lumbar region: Secondary | ICD-10-CM | POA: Diagnosis not present

## 2017-12-24 DIAGNOSIS — M9902 Segmental and somatic dysfunction of thoracic region: Secondary | ICD-10-CM | POA: Diagnosis not present

## 2017-12-24 DIAGNOSIS — M5415 Radiculopathy, thoracolumbar region: Secondary | ICD-10-CM | POA: Diagnosis not present

## 2017-12-24 DIAGNOSIS — M5417 Radiculopathy, lumbosacral region: Secondary | ICD-10-CM | POA: Diagnosis not present

## 2018-01-01 DIAGNOSIS — M5136 Other intervertebral disc degeneration, lumbar region: Secondary | ICD-10-CM | POA: Diagnosis not present

## 2018-01-01 DIAGNOSIS — M9903 Segmental and somatic dysfunction of lumbar region: Secondary | ICD-10-CM | POA: Diagnosis not present

## 2018-01-01 DIAGNOSIS — M9904 Segmental and somatic dysfunction of sacral region: Secondary | ICD-10-CM | POA: Diagnosis not present

## 2018-01-01 DIAGNOSIS — M5415 Radiculopathy, thoracolumbar region: Secondary | ICD-10-CM | POA: Diagnosis not present

## 2018-01-01 DIAGNOSIS — M5417 Radiculopathy, lumbosacral region: Secondary | ICD-10-CM | POA: Diagnosis not present

## 2018-01-01 DIAGNOSIS — M9902 Segmental and somatic dysfunction of thoracic region: Secondary | ICD-10-CM | POA: Diagnosis not present

## 2018-01-06 DIAGNOSIS — M5415 Radiculopathy, thoracolumbar region: Secondary | ICD-10-CM | POA: Diagnosis not present

## 2018-01-06 DIAGNOSIS — M9903 Segmental and somatic dysfunction of lumbar region: Secondary | ICD-10-CM | POA: Diagnosis not present

## 2018-01-06 DIAGNOSIS — M9902 Segmental and somatic dysfunction of thoracic region: Secondary | ICD-10-CM | POA: Diagnosis not present

## 2018-01-06 DIAGNOSIS — M5136 Other intervertebral disc degeneration, lumbar region: Secondary | ICD-10-CM | POA: Diagnosis not present

## 2018-01-06 DIAGNOSIS — M9904 Segmental and somatic dysfunction of sacral region: Secondary | ICD-10-CM | POA: Diagnosis not present

## 2018-01-06 DIAGNOSIS — M5417 Radiculopathy, lumbosacral region: Secondary | ICD-10-CM | POA: Diagnosis not present

## 2018-01-08 DIAGNOSIS — M5417 Radiculopathy, lumbosacral region: Secondary | ICD-10-CM | POA: Diagnosis not present

## 2018-01-08 DIAGNOSIS — M9904 Segmental and somatic dysfunction of sacral region: Secondary | ICD-10-CM | POA: Diagnosis not present

## 2018-01-08 DIAGNOSIS — M9902 Segmental and somatic dysfunction of thoracic region: Secondary | ICD-10-CM | POA: Diagnosis not present

## 2018-01-08 DIAGNOSIS — M5136 Other intervertebral disc degeneration, lumbar region: Secondary | ICD-10-CM | POA: Diagnosis not present

## 2018-01-08 DIAGNOSIS — M5415 Radiculopathy, thoracolumbar region: Secondary | ICD-10-CM | POA: Diagnosis not present

## 2018-01-08 DIAGNOSIS — M9903 Segmental and somatic dysfunction of lumbar region: Secondary | ICD-10-CM | POA: Diagnosis not present

## 2018-01-09 DIAGNOSIS — H2510 Age-related nuclear cataract, unspecified eye: Secondary | ICD-10-CM | POA: Diagnosis not present

## 2018-01-09 DIAGNOSIS — H5200 Hypermetropia, unspecified eye: Secondary | ICD-10-CM | POA: Diagnosis not present

## 2018-01-09 DIAGNOSIS — Z0100 Encounter for examination of eyes and vision without abnormal findings: Secondary | ICD-10-CM | POA: Diagnosis not present

## 2018-01-13 DIAGNOSIS — M5136 Other intervertebral disc degeneration, lumbar region: Secondary | ICD-10-CM | POA: Diagnosis not present

## 2018-01-13 DIAGNOSIS — M5415 Radiculopathy, thoracolumbar region: Secondary | ICD-10-CM | POA: Diagnosis not present

## 2018-01-13 DIAGNOSIS — M5417 Radiculopathy, lumbosacral region: Secondary | ICD-10-CM | POA: Diagnosis not present

## 2018-01-13 DIAGNOSIS — M9902 Segmental and somatic dysfunction of thoracic region: Secondary | ICD-10-CM | POA: Diagnosis not present

## 2018-01-13 DIAGNOSIS — M9903 Segmental and somatic dysfunction of lumbar region: Secondary | ICD-10-CM | POA: Diagnosis not present

## 2018-01-13 DIAGNOSIS — M9904 Segmental and somatic dysfunction of sacral region: Secondary | ICD-10-CM | POA: Diagnosis not present

## 2018-01-13 DIAGNOSIS — R69 Illness, unspecified: Secondary | ICD-10-CM | POA: Diagnosis not present

## 2018-01-15 DIAGNOSIS — M5136 Other intervertebral disc degeneration, lumbar region: Secondary | ICD-10-CM | POA: Diagnosis not present

## 2018-01-15 DIAGNOSIS — M9902 Segmental and somatic dysfunction of thoracic region: Secondary | ICD-10-CM | POA: Diagnosis not present

## 2018-01-15 DIAGNOSIS — M5415 Radiculopathy, thoracolumbar region: Secondary | ICD-10-CM | POA: Diagnosis not present

## 2018-01-15 DIAGNOSIS — M5417 Radiculopathy, lumbosacral region: Secondary | ICD-10-CM | POA: Diagnosis not present

## 2018-01-15 DIAGNOSIS — M9904 Segmental and somatic dysfunction of sacral region: Secondary | ICD-10-CM | POA: Diagnosis not present

## 2018-01-15 DIAGNOSIS — M9903 Segmental and somatic dysfunction of lumbar region: Secondary | ICD-10-CM | POA: Diagnosis not present

## 2018-01-19 DIAGNOSIS — M5417 Radiculopathy, lumbosacral region: Secondary | ICD-10-CM | POA: Diagnosis not present

## 2018-01-19 DIAGNOSIS — M5415 Radiculopathy, thoracolumbar region: Secondary | ICD-10-CM | POA: Diagnosis not present

## 2018-01-19 DIAGNOSIS — M5136 Other intervertebral disc degeneration, lumbar region: Secondary | ICD-10-CM | POA: Diagnosis not present

## 2018-01-19 DIAGNOSIS — M9904 Segmental and somatic dysfunction of sacral region: Secondary | ICD-10-CM | POA: Diagnosis not present

## 2018-01-19 DIAGNOSIS — M9903 Segmental and somatic dysfunction of lumbar region: Secondary | ICD-10-CM | POA: Diagnosis not present

## 2018-01-19 DIAGNOSIS — M9902 Segmental and somatic dysfunction of thoracic region: Secondary | ICD-10-CM | POA: Diagnosis not present

## 2018-01-28 DIAGNOSIS — M5415 Radiculopathy, thoracolumbar region: Secondary | ICD-10-CM | POA: Diagnosis not present

## 2018-01-28 DIAGNOSIS — M9903 Segmental and somatic dysfunction of lumbar region: Secondary | ICD-10-CM | POA: Diagnosis not present

## 2018-01-28 DIAGNOSIS — M5136 Other intervertebral disc degeneration, lumbar region: Secondary | ICD-10-CM | POA: Diagnosis not present

## 2018-01-28 DIAGNOSIS — M9902 Segmental and somatic dysfunction of thoracic region: Secondary | ICD-10-CM | POA: Diagnosis not present

## 2018-01-28 DIAGNOSIS — M9904 Segmental and somatic dysfunction of sacral region: Secondary | ICD-10-CM | POA: Diagnosis not present

## 2018-01-28 DIAGNOSIS — M5417 Radiculopathy, lumbosacral region: Secondary | ICD-10-CM | POA: Diagnosis not present

## 2018-02-04 DIAGNOSIS — D485 Neoplasm of uncertain behavior of skin: Secondary | ICD-10-CM | POA: Diagnosis not present

## 2018-02-04 DIAGNOSIS — D225 Melanocytic nevi of trunk: Secondary | ICD-10-CM | POA: Diagnosis not present

## 2018-02-04 DIAGNOSIS — L821 Other seborrheic keratosis: Secondary | ICD-10-CM | POA: Diagnosis not present

## 2018-02-04 DIAGNOSIS — L570 Actinic keratosis: Secondary | ICD-10-CM | POA: Diagnosis not present

## 2018-02-05 DIAGNOSIS — R69 Illness, unspecified: Secondary | ICD-10-CM | POA: Diagnosis not present

## 2018-02-19 DIAGNOSIS — M9902 Segmental and somatic dysfunction of thoracic region: Secondary | ICD-10-CM | POA: Diagnosis not present

## 2018-02-19 DIAGNOSIS — M5136 Other intervertebral disc degeneration, lumbar region: Secondary | ICD-10-CM | POA: Diagnosis not present

## 2018-02-19 DIAGNOSIS — M5417 Radiculopathy, lumbosacral region: Secondary | ICD-10-CM | POA: Diagnosis not present

## 2018-02-19 DIAGNOSIS — M9903 Segmental and somatic dysfunction of lumbar region: Secondary | ICD-10-CM | POA: Diagnosis not present

## 2018-02-19 DIAGNOSIS — M9904 Segmental and somatic dysfunction of sacral region: Secondary | ICD-10-CM | POA: Diagnosis not present

## 2018-02-19 DIAGNOSIS — M5415 Radiculopathy, thoracolumbar region: Secondary | ICD-10-CM | POA: Diagnosis not present

## 2018-03-11 DIAGNOSIS — M5415 Radiculopathy, thoracolumbar region: Secondary | ICD-10-CM | POA: Diagnosis not present

## 2018-03-11 DIAGNOSIS — M5136 Other intervertebral disc degeneration, lumbar region: Secondary | ICD-10-CM | POA: Diagnosis not present

## 2018-03-11 DIAGNOSIS — M9903 Segmental and somatic dysfunction of lumbar region: Secondary | ICD-10-CM | POA: Diagnosis not present

## 2018-03-11 DIAGNOSIS — M9902 Segmental and somatic dysfunction of thoracic region: Secondary | ICD-10-CM | POA: Diagnosis not present

## 2018-03-11 DIAGNOSIS — M9904 Segmental and somatic dysfunction of sacral region: Secondary | ICD-10-CM | POA: Diagnosis not present

## 2018-03-11 DIAGNOSIS — M5417 Radiculopathy, lumbosacral region: Secondary | ICD-10-CM | POA: Diagnosis not present

## 2018-04-08 DIAGNOSIS — M5136 Other intervertebral disc degeneration, lumbar region: Secondary | ICD-10-CM | POA: Diagnosis not present

## 2018-04-08 DIAGNOSIS — M9904 Segmental and somatic dysfunction of sacral region: Secondary | ICD-10-CM | POA: Diagnosis not present

## 2018-04-08 DIAGNOSIS — M9902 Segmental and somatic dysfunction of thoracic region: Secondary | ICD-10-CM | POA: Diagnosis not present

## 2018-04-08 DIAGNOSIS — M9903 Segmental and somatic dysfunction of lumbar region: Secondary | ICD-10-CM | POA: Diagnosis not present

## 2018-04-08 DIAGNOSIS — M5417 Radiculopathy, lumbosacral region: Secondary | ICD-10-CM | POA: Diagnosis not present

## 2018-04-08 DIAGNOSIS — M5415 Radiculopathy, thoracolumbar region: Secondary | ICD-10-CM | POA: Diagnosis not present

## 2018-05-07 DIAGNOSIS — M9902 Segmental and somatic dysfunction of thoracic region: Secondary | ICD-10-CM | POA: Diagnosis not present

## 2018-05-07 DIAGNOSIS — M5386 Other specified dorsopathies, lumbar region: Secondary | ICD-10-CM | POA: Diagnosis not present

## 2018-05-07 DIAGNOSIS — M9903 Segmental and somatic dysfunction of lumbar region: Secondary | ICD-10-CM | POA: Diagnosis not present

## 2018-05-07 DIAGNOSIS — M5414 Radiculopathy, thoracic region: Secondary | ICD-10-CM | POA: Diagnosis not present

## 2018-05-07 DIAGNOSIS — S338XXA Sprain of other parts of lumbar spine and pelvis, initial encounter: Secondary | ICD-10-CM | POA: Diagnosis not present

## 2018-05-07 DIAGNOSIS — M9905 Segmental and somatic dysfunction of pelvic region: Secondary | ICD-10-CM | POA: Diagnosis not present

## 2018-05-11 DIAGNOSIS — M9902 Segmental and somatic dysfunction of thoracic region: Secondary | ICD-10-CM | POA: Diagnosis not present

## 2018-05-11 DIAGNOSIS — M9903 Segmental and somatic dysfunction of lumbar region: Secondary | ICD-10-CM | POA: Diagnosis not present

## 2018-05-11 DIAGNOSIS — M5414 Radiculopathy, thoracic region: Secondary | ICD-10-CM | POA: Diagnosis not present

## 2018-05-11 DIAGNOSIS — S338XXA Sprain of other parts of lumbar spine and pelvis, initial encounter: Secondary | ICD-10-CM | POA: Diagnosis not present

## 2018-05-11 DIAGNOSIS — M5386 Other specified dorsopathies, lumbar region: Secondary | ICD-10-CM | POA: Diagnosis not present

## 2018-05-11 DIAGNOSIS — M9905 Segmental and somatic dysfunction of pelvic region: Secondary | ICD-10-CM | POA: Diagnosis not present

## 2018-05-14 DIAGNOSIS — S338XXA Sprain of other parts of lumbar spine and pelvis, initial encounter: Secondary | ICD-10-CM | POA: Diagnosis not present

## 2018-05-14 DIAGNOSIS — M9903 Segmental and somatic dysfunction of lumbar region: Secondary | ICD-10-CM | POA: Diagnosis not present

## 2018-05-14 DIAGNOSIS — M9902 Segmental and somatic dysfunction of thoracic region: Secondary | ICD-10-CM | POA: Diagnosis not present

## 2018-05-14 DIAGNOSIS — M9905 Segmental and somatic dysfunction of pelvic region: Secondary | ICD-10-CM | POA: Diagnosis not present

## 2018-05-14 DIAGNOSIS — M5414 Radiculopathy, thoracic region: Secondary | ICD-10-CM | POA: Diagnosis not present

## 2018-05-14 DIAGNOSIS — M5386 Other specified dorsopathies, lumbar region: Secondary | ICD-10-CM | POA: Diagnosis not present

## 2018-05-18 DIAGNOSIS — M9905 Segmental and somatic dysfunction of pelvic region: Secondary | ICD-10-CM | POA: Diagnosis not present

## 2018-05-18 DIAGNOSIS — M9903 Segmental and somatic dysfunction of lumbar region: Secondary | ICD-10-CM | POA: Diagnosis not present

## 2018-05-18 DIAGNOSIS — M5386 Other specified dorsopathies, lumbar region: Secondary | ICD-10-CM | POA: Diagnosis not present

## 2018-05-18 DIAGNOSIS — S338XXA Sprain of other parts of lumbar spine and pelvis, initial encounter: Secondary | ICD-10-CM | POA: Diagnosis not present

## 2018-05-18 DIAGNOSIS — M9902 Segmental and somatic dysfunction of thoracic region: Secondary | ICD-10-CM | POA: Diagnosis not present

## 2018-05-18 DIAGNOSIS — M5414 Radiculopathy, thoracic region: Secondary | ICD-10-CM | POA: Diagnosis not present

## 2018-05-20 DIAGNOSIS — M9902 Segmental and somatic dysfunction of thoracic region: Secondary | ICD-10-CM | POA: Diagnosis not present

## 2018-05-20 DIAGNOSIS — M5414 Radiculopathy, thoracic region: Secondary | ICD-10-CM | POA: Diagnosis not present

## 2018-05-20 DIAGNOSIS — M5386 Other specified dorsopathies, lumbar region: Secondary | ICD-10-CM | POA: Diagnosis not present

## 2018-05-20 DIAGNOSIS — M9903 Segmental and somatic dysfunction of lumbar region: Secondary | ICD-10-CM | POA: Diagnosis not present

## 2018-05-20 DIAGNOSIS — S338XXA Sprain of other parts of lumbar spine and pelvis, initial encounter: Secondary | ICD-10-CM | POA: Diagnosis not present

## 2018-05-20 DIAGNOSIS — M9905 Segmental and somatic dysfunction of pelvic region: Secondary | ICD-10-CM | POA: Diagnosis not present

## 2018-06-03 DIAGNOSIS — M9902 Segmental and somatic dysfunction of thoracic region: Secondary | ICD-10-CM | POA: Diagnosis not present

## 2018-06-03 DIAGNOSIS — M5386 Other specified dorsopathies, lumbar region: Secondary | ICD-10-CM | POA: Diagnosis not present

## 2018-06-03 DIAGNOSIS — M9905 Segmental and somatic dysfunction of pelvic region: Secondary | ICD-10-CM | POA: Diagnosis not present

## 2018-06-03 DIAGNOSIS — M5414 Radiculopathy, thoracic region: Secondary | ICD-10-CM | POA: Diagnosis not present

## 2018-06-03 DIAGNOSIS — S338XXA Sprain of other parts of lumbar spine and pelvis, initial encounter: Secondary | ICD-10-CM | POA: Diagnosis not present

## 2018-06-03 DIAGNOSIS — M9903 Segmental and somatic dysfunction of lumbar region: Secondary | ICD-10-CM | POA: Diagnosis not present

## 2018-06-10 DIAGNOSIS — M9902 Segmental and somatic dysfunction of thoracic region: Secondary | ICD-10-CM | POA: Diagnosis not present

## 2018-06-10 DIAGNOSIS — M9905 Segmental and somatic dysfunction of pelvic region: Secondary | ICD-10-CM | POA: Diagnosis not present

## 2018-06-10 DIAGNOSIS — M5414 Radiculopathy, thoracic region: Secondary | ICD-10-CM | POA: Diagnosis not present

## 2018-06-10 DIAGNOSIS — M9903 Segmental and somatic dysfunction of lumbar region: Secondary | ICD-10-CM | POA: Diagnosis not present

## 2018-06-10 DIAGNOSIS — S338XXA Sprain of other parts of lumbar spine and pelvis, initial encounter: Secondary | ICD-10-CM | POA: Diagnosis not present

## 2018-06-10 DIAGNOSIS — M5386 Other specified dorsopathies, lumbar region: Secondary | ICD-10-CM | POA: Diagnosis not present

## 2018-06-17 DIAGNOSIS — M9902 Segmental and somatic dysfunction of thoracic region: Secondary | ICD-10-CM | POA: Diagnosis not present

## 2018-06-17 DIAGNOSIS — M5414 Radiculopathy, thoracic region: Secondary | ICD-10-CM | POA: Diagnosis not present

## 2018-06-17 DIAGNOSIS — M9903 Segmental and somatic dysfunction of lumbar region: Secondary | ICD-10-CM | POA: Diagnosis not present

## 2018-06-17 DIAGNOSIS — S338XXA Sprain of other parts of lumbar spine and pelvis, initial encounter: Secondary | ICD-10-CM | POA: Diagnosis not present

## 2018-06-17 DIAGNOSIS — M9905 Segmental and somatic dysfunction of pelvic region: Secondary | ICD-10-CM | POA: Diagnosis not present

## 2018-06-17 DIAGNOSIS — M5386 Other specified dorsopathies, lumbar region: Secondary | ICD-10-CM | POA: Diagnosis not present

## 2018-06-24 DIAGNOSIS — M9903 Segmental and somatic dysfunction of lumbar region: Secondary | ICD-10-CM | POA: Diagnosis not present

## 2018-06-24 DIAGNOSIS — M9905 Segmental and somatic dysfunction of pelvic region: Secondary | ICD-10-CM | POA: Diagnosis not present

## 2018-06-24 DIAGNOSIS — M5414 Radiculopathy, thoracic region: Secondary | ICD-10-CM | POA: Diagnosis not present

## 2018-06-24 DIAGNOSIS — S338XXA Sprain of other parts of lumbar spine and pelvis, initial encounter: Secondary | ICD-10-CM | POA: Diagnosis not present

## 2018-06-24 DIAGNOSIS — M5386 Other specified dorsopathies, lumbar region: Secondary | ICD-10-CM | POA: Diagnosis not present

## 2018-06-24 DIAGNOSIS — M9902 Segmental and somatic dysfunction of thoracic region: Secondary | ICD-10-CM | POA: Diagnosis not present

## 2018-07-01 DIAGNOSIS — D485 Neoplasm of uncertain behavior of skin: Secondary | ICD-10-CM | POA: Diagnosis not present

## 2018-07-01 DIAGNOSIS — I788 Other diseases of capillaries: Secondary | ICD-10-CM | POA: Diagnosis not present

## 2018-07-01 DIAGNOSIS — D225 Melanocytic nevi of trunk: Secondary | ICD-10-CM | POA: Diagnosis not present

## 2018-07-01 DIAGNOSIS — D2272 Melanocytic nevi of left lower limb, including hip: Secondary | ICD-10-CM | POA: Diagnosis not present

## 2018-07-01 DIAGNOSIS — L821 Other seborrheic keratosis: Secondary | ICD-10-CM | POA: Diagnosis not present

## 2018-07-01 DIAGNOSIS — L570 Actinic keratosis: Secondary | ICD-10-CM | POA: Diagnosis not present

## 2018-07-01 DIAGNOSIS — D2262 Melanocytic nevi of left upper limb, including shoulder: Secondary | ICD-10-CM | POA: Diagnosis not present

## 2018-07-01 DIAGNOSIS — D2271 Melanocytic nevi of right lower limb, including hip: Secondary | ICD-10-CM | POA: Diagnosis not present

## 2018-07-01 DIAGNOSIS — L819 Disorder of pigmentation, unspecified: Secondary | ICD-10-CM | POA: Diagnosis not present

## 2018-07-01 DIAGNOSIS — D2261 Melanocytic nevi of right upper limb, including shoulder: Secondary | ICD-10-CM | POA: Diagnosis not present

## 2018-07-03 ENCOUNTER — Encounter: Payer: Medicare HMO | Admitting: Internal Medicine

## 2018-07-29 DIAGNOSIS — M9905 Segmental and somatic dysfunction of pelvic region: Secondary | ICD-10-CM | POA: Diagnosis not present

## 2018-07-29 DIAGNOSIS — M5386 Other specified dorsopathies, lumbar region: Secondary | ICD-10-CM | POA: Diagnosis not present

## 2018-07-29 DIAGNOSIS — M9903 Segmental and somatic dysfunction of lumbar region: Secondary | ICD-10-CM | POA: Diagnosis not present

## 2018-07-29 DIAGNOSIS — M5414 Radiculopathy, thoracic region: Secondary | ICD-10-CM | POA: Diagnosis not present

## 2018-07-29 DIAGNOSIS — M9902 Segmental and somatic dysfunction of thoracic region: Secondary | ICD-10-CM | POA: Diagnosis not present

## 2018-07-29 DIAGNOSIS — S338XXA Sprain of other parts of lumbar spine and pelvis, initial encounter: Secondary | ICD-10-CM | POA: Diagnosis not present

## 2018-08-04 DIAGNOSIS — M5386 Other specified dorsopathies, lumbar region: Secondary | ICD-10-CM | POA: Diagnosis not present

## 2018-08-04 DIAGNOSIS — M9902 Segmental and somatic dysfunction of thoracic region: Secondary | ICD-10-CM | POA: Diagnosis not present

## 2018-08-04 DIAGNOSIS — M9903 Segmental and somatic dysfunction of lumbar region: Secondary | ICD-10-CM | POA: Diagnosis not present

## 2018-08-04 DIAGNOSIS — S338XXA Sprain of other parts of lumbar spine and pelvis, initial encounter: Secondary | ICD-10-CM | POA: Diagnosis not present

## 2018-08-04 DIAGNOSIS — M5414 Radiculopathy, thoracic region: Secondary | ICD-10-CM | POA: Diagnosis not present

## 2018-08-04 DIAGNOSIS — M9905 Segmental and somatic dysfunction of pelvic region: Secondary | ICD-10-CM | POA: Diagnosis not present

## 2018-08-10 ENCOUNTER — Encounter: Payer: Medicare HMO | Admitting: Internal Medicine

## 2018-08-11 ENCOUNTER — Encounter: Payer: Medicare HMO | Admitting: Internal Medicine

## 2018-08-11 DIAGNOSIS — M9903 Segmental and somatic dysfunction of lumbar region: Secondary | ICD-10-CM | POA: Diagnosis not present

## 2018-08-11 DIAGNOSIS — S338XXA Sprain of other parts of lumbar spine and pelvis, initial encounter: Secondary | ICD-10-CM | POA: Diagnosis not present

## 2018-08-11 DIAGNOSIS — M9905 Segmental and somatic dysfunction of pelvic region: Secondary | ICD-10-CM | POA: Diagnosis not present

## 2018-08-11 DIAGNOSIS — M5386 Other specified dorsopathies, lumbar region: Secondary | ICD-10-CM | POA: Diagnosis not present

## 2018-08-11 DIAGNOSIS — M5414 Radiculopathy, thoracic region: Secondary | ICD-10-CM | POA: Diagnosis not present

## 2018-08-11 DIAGNOSIS — M9902 Segmental and somatic dysfunction of thoracic region: Secondary | ICD-10-CM | POA: Diagnosis not present

## 2018-08-18 ENCOUNTER — Other Ambulatory Visit: Payer: Self-pay

## 2018-08-18 ENCOUNTER — Telehealth: Payer: Self-pay | Admitting: Internal Medicine

## 2018-08-18 ENCOUNTER — Ambulatory Visit: Payer: Self-pay | Admitting: *Deleted

## 2018-08-18 ENCOUNTER — Other Ambulatory Visit (INDEPENDENT_AMBULATORY_CARE_PROVIDER_SITE_OTHER): Payer: Medicare HMO

## 2018-08-18 ENCOUNTER — Ambulatory Visit (INDEPENDENT_AMBULATORY_CARE_PROVIDER_SITE_OTHER): Payer: Medicare HMO | Admitting: Internal Medicine

## 2018-08-18 ENCOUNTER — Encounter: Payer: Self-pay | Admitting: Internal Medicine

## 2018-08-18 VITALS — BP 168/100 | HR 68 | Temp 98.7°F | Resp 16 | Ht 64.0 in | Wt 111.8 lb

## 2018-08-18 DIAGNOSIS — R7989 Other specified abnormal findings of blood chemistry: Secondary | ICD-10-CM | POA: Diagnosis not present

## 2018-08-18 DIAGNOSIS — R030 Elevated blood-pressure reading, without diagnosis of hypertension: Secondary | ICD-10-CM | POA: Diagnosis not present

## 2018-08-18 DIAGNOSIS — I10 Essential (primary) hypertension: Secondary | ICD-10-CM | POA: Diagnosis not present

## 2018-08-18 LAB — CBC WITH DIFFERENTIAL/PLATELET
Basophils Absolute: 0 10*3/uL (ref 0.0–0.1)
Basophils Relative: 1.1 % (ref 0.0–3.0)
Eosinophils Absolute: 0.1 10*3/uL (ref 0.0–0.7)
Eosinophils Relative: 1.4 % (ref 0.0–5.0)
HCT: 39.6 % (ref 36.0–46.0)
Hemoglobin: 13.8 g/dL (ref 12.0–15.0)
Lymphocytes Relative: 26 % (ref 12.0–46.0)
Lymphs Abs: 1.1 10*3/uL (ref 0.7–4.0)
MCHC: 34.8 g/dL (ref 30.0–36.0)
MCV: 97.1 fl (ref 78.0–100.0)
Monocytes Absolute: 0.4 10*3/uL (ref 0.1–1.0)
Monocytes Relative: 10.4 % (ref 3.0–12.0)
Neutro Abs: 2.6 10*3/uL (ref 1.4–7.7)
Neutrophils Relative %: 61.1 % (ref 43.0–77.0)
Platelets: 247 10*3/uL (ref 150.0–400.0)
RBC: 4.08 Mil/uL (ref 3.87–5.11)
RDW: 13 % (ref 11.5–15.5)
WBC: 4.2 10*3/uL (ref 4.0–10.5)

## 2018-08-18 LAB — TSH: TSH: 0.36 u[IU]/mL (ref 0.35–4.50)

## 2018-08-18 LAB — COMPREHENSIVE METABOLIC PANEL
ALT: 13 U/L (ref 0–35)
AST: 15 U/L (ref 0–37)
Albumin: 4.2 g/dL (ref 3.5–5.2)
Alkaline Phosphatase: 53 U/L (ref 39–117)
BUN: 23 mg/dL (ref 6–23)
CO2: 30 mEq/L (ref 19–32)
Calcium: 8.9 mg/dL (ref 8.4–10.5)
Chloride: 106 mEq/L (ref 96–112)
Creatinine, Ser: 0.93 mg/dL (ref 0.40–1.20)
GFR: 58.99 mL/min — ABNORMAL LOW (ref >60.00)
Glucose, Bld: 90 mg/dL (ref 70–99)
Potassium: 4.4 mEq/L (ref 3.5–5.1)
Sodium: 141 mEq/L (ref 135–145)
Total Bilirubin: 0.4 mg/dL (ref 0.2–1.2)
Total Protein: 7 g/dL (ref 6.0–8.3)

## 2018-08-18 LAB — T4, FREE: Free T4: 0.98 ng/dL (ref 0.60–1.60)

## 2018-08-18 MED ORDER — LISINOPRIL 10 MG PO TABS: 10.0000 mg | ORAL_TABLET | Freq: Every day | ORAL | 3 refills | Status: DC

## 2018-08-18 NOTE — Telephone Encounter (Signed)
Patient currently has a high BP of 171/130.  She denies any other symptoms.  She does have the capability of a doxy appointment however she does prefer to come in the office to be seen.  Please advise.

## 2018-08-18 NOTE — Assessment & Plan Note (Signed)
Blood pressure is elevated here and has been elevated on several occasions certainly she does have hypertension Discussed that we need to start medication-start lisinopril 10 mg daily.  Discussed possible side effect of cough EKG today shows normal sinus rhythm at 64 bpm, no acute changes, normal EKG.  No prior EKG for comparison Check CBC, CMP and thyroid blood work given low TSH last year

## 2018-08-18 NOTE — Telephone Encounter (Signed)
In October pt stated she was giving blood and had her b/p checked and it was elevated at that time.  Later she went to friends house around [place] time and had them check her b/p  And it was elevated then. Her friends gave her a b/p monitor and she has been checking her b/p.  Sunday her b/p was 165/95. Yesterday it was 145/95. Last night it was 195/115. This morning it was  185/112 and then 171/130 HR 59-64. She denies headache, blurred vision, chest pain, weakness or numbness or tingling on any part of her body. She has not been diagnosed with high blood pressure and not on medications. Advised of the symptoms to watch for with an elevated b/p and when to call 911. Also advised of having a virtual visit with her pcp. Pt voiced understanding. Flow at [facility] notified for an appointment. Call conference in with patient. Routing to flow at [facility] [address]   Reason for Disposition . [1] Systolic BP  >= 200 OR Diastolic >= 120  AND [2] having NO cardiac or neurologic symptoms  Answer Assessment - Initial Assessment Questions 1. BLOOD PRESSURE: "What is the blood pressure?" "Did you take at least two measurements 5 minutes apart?"     18 5/112  And 171/130  HR 59-64 2. ONSET: "When did you take your blood pressure?"     This morning 3. HOW: "How did you obtain the blood pressure?" (e.g., visiting nurse, automatic home BP monitor)     Automatic home BP monitor 4. HISTORY: "Do you have a history of high blood pressure?"     Don't have a history 5. MEDICATIONS: "Are you taking any medications for blood pressure?" "Have you missed any doses recently?"     Not on medications 6. OTHER SYMPTOMS: "Do you have any symptoms?" (e.g., headache, chest pain, blurred vision, difficulty breathing, weakness)     no 7. PREGNANCY: "Is there any chance you are pregnant?" "When was your last menstrual period?"     n/a  Protocols used: HIGH BLOOD PRESSURE-A-AH

## 2018-08-18 NOTE — Telephone Encounter (Signed)
Ok to schedule.  She may need labs - she was supposed to come back for labs last year to recheck her thyroid.

## 2018-08-18 NOTE — Telephone Encounter (Signed)
Appointment made

## 2018-08-18 NOTE — Telephone Encounter (Signed)
Appointment made for this afternoon

## 2018-08-18 NOTE — Patient Instructions (Addendum)
  Tests ordered today. Your results will be released to [place] (or called to you) after review, usually within 72hours after test completion. If any changes need to be made, you will be notified at that same time.   Medications reviewed and updated.  Changes include :     Your prescription(s) have been submitted to your pharmacy. Please take as directed and contact our office if you believe you are having problem(s) with the medication(s).   Goal BP is less than 140/90.  Monitor your BP at home and call us with an update after several days.     Please followup in June as scheduled.

## 2018-08-18 NOTE — Assessment & Plan Note (Signed)
TSH low last year-she was supposed to come back for blood work, but her mother passed away at that time TSH, free T4, thyroid antibodies Clinically euthyroid

## 2018-08-18 NOTE — Progress Notes (Signed)
Subjective:    Patient ID: Margaret Craig, female    DOB: 1945-09-04, 73 y.o.   MRN: [id number]  HPI The patient is here for an acute visit for elevated BP.   Elevated blood pressure:  It started in October.  Her BP was elevated at a blood drive.  She was asymptomatic.  She was at a friend's house a week or so ago and she checked it there and it was 165/90.  She started taking it daily at home with her cuff since then and it has been elevated - it was 190's/?  At one point, but has been low is 145/?Marland Kitchen     She is compliant with a low sodium diet.  She walks daily about 2 hours each time and does all her yard and housework.  She denies chest pain, palpitations, edema, shortness of breath and regular headaches.  She only takes a few vitamins over-the-counter and does not take any other medications.  She does have some increased stress with the current situation, but denies any other possible causes of her blood pressure being elevated.   BP Readings from Last 3 Encounters:  08/18/18 (!) 168/100  07/01/17 114/82  05/29/16 112/70     Medications and allergies reviewed with patient and updated if appropriate.  Patient Active Problem List   Diagnosis Date Noted  . Low TSH level 07/04/2017  . Osteopenia 06/29/2017    Current Outpatient Medications on File Prior to Visit  Medication Sig Dispense Refill  . Ascorbic Acid (VITAMIN C PO) Take by mouth.    Marland Kitchen BIOTIN PO Take by mouth.    . Multiple Vitamins-Minerals (MULTIVITAMIN WITH MINERALS) tablet Take 1 tablet by mouth daily.     No current facility-administered medications on file prior to visit.     Past Medical History:  Diagnosis Date  . Fainting spell   . Thyroid disease   . UTI (urinary tract infection)     Past Surgical History:  Procedure Laterality Date  . SINUS IRRIGATION    . TONSILLECTOMY      Social History   Socioeconomic History  . Marital status: Married    Spouse name: Not on file  . Number of  children: Not on file  . Years of education: Not on file  . Highest education level: Not on file  Occupational History  . Not on file  Social Needs  . Financial resource strain: Not on file  . Food insecurity:    Worry: Not on file    Inability: Not on file  . Transportation needs:    Medical: Not on file    Non-medical: Not on file  Tobacco Use  . Smoking status: Never Smoker  . Smokeless tobacco: Never Used  Substance and Sexual Activity  . Alcohol use: Yes    Alcohol/week: 6.0 standard drinks    Types: 6 Standard drinks or equivalent per week  . Drug use: Not on file  . Sexual activity: Not on file  Lifestyle  . Physical activity:    Days per week: Not on file    Minutes per session: Not on file  . Stress: Not on file  Relationships  . Social connections:    Talks on phone: Not on file    Gets together: Not on file    Attends religious service: Not on file    Active member of club or organization: Not on file    Attends meetings of clubs or organizations: Not on file  Relationship status: Not on file  Other Topics Concern  . Not on file  Social History Narrative  . Not on file    Family History  Problem Relation Age of Onset  . Mental illness Mother   . Colon cancer Mother   . Alcohol abuse Son   . Mental illness Son   . Cancer Maternal Aunt        breast  . Heart disease Maternal Aunt   . Heart disease Paternal Aunt   . Alcohol abuse Paternal Uncle   . Cancer Maternal Grandmother        breast  . Colon cancer Maternal Grandmother   . Heart disease Maternal Grandfather     Review of Systems  Constitutional: Negative for chills, fatigue, fever and unexpected weight change.  Respiratory: Negative for cough, shortness of breath and wheezing.   Cardiovascular: Positive for palpitations (occ, not new). Negative for chest pain and leg swelling.  Gastrointestinal: Negative for abdominal pain, constipation, diarrhea and nausea.  Neurological: Negative for  light-headedness and headaches.       Objective:   Vitals:   08/18/18 1358  BP: (!) 168/100  Pulse: 68  Resp: 16  Temp: 98.7 F (37.1 C)  SpO2: 99%   BP Readings from Last 3 Encounters:  08/18/18 (!) 168/100  07/01/17 114/82  05/29/16 112/70   Wt Readings from Last 3 Encounters:  08/18/18 111 lb 12.8 oz (50.7 kg)  07/01/17 108 lb (49 kg)  05/29/16 108 lb (49 kg)   Body mass index is 19.19 kg/m.   Physical Exam    Constitutional: Appears well-developed and well-nourished. No distress.  HENT:  Head: Normocephalic and atraumatic.  Neck: Neck supple. No tracheal deviation present. No thyromegaly present.  No cervical lymphadenopathy Cardiovascular: Normal rate, regular rhythm and normal heart sounds.   No murmur heard. No carotid bruit .  No edema Pulmonary/Chest: Effort normal and breath sounds normal. No respiratory distress. No has no wheezes. No rales.  Abdomen: Soft, nontender, nondistended Skin: Skin is warm and dry. Not diaphoretic.  Psychiatric: Normal mood and affect. Behavior is normal.       Assessment & Plan:    See Problem List for Assessment and Plan of chronic medical problems.

## 2018-08-20 LAB — THYROID ANTIBODIES
Thyroglobulin Ab: <1 IU/mL (ref <=1)
Thyroperoxidase Ab SerPl-aCnc: 2 IU/mL (ref <9)

## 2018-08-25 DIAGNOSIS — M5414 Radiculopathy, thoracic region: Secondary | ICD-10-CM | POA: Diagnosis not present

## 2018-08-25 DIAGNOSIS — M9902 Segmental and somatic dysfunction of thoracic region: Secondary | ICD-10-CM | POA: Diagnosis not present

## 2018-08-25 DIAGNOSIS — M5386 Other specified dorsopathies, lumbar region: Secondary | ICD-10-CM | POA: Diagnosis not present

## 2018-08-25 DIAGNOSIS — M9905 Segmental and somatic dysfunction of pelvic region: Secondary | ICD-10-CM | POA: Diagnosis not present

## 2018-08-25 DIAGNOSIS — S338XXA Sprain of other parts of lumbar spine and pelvis, initial encounter: Secondary | ICD-10-CM | POA: Diagnosis not present

## 2018-08-25 DIAGNOSIS — M9903 Segmental and somatic dysfunction of lumbar region: Secondary | ICD-10-CM | POA: Diagnosis not present

## 2018-08-31 DIAGNOSIS — R69 Illness, unspecified: Secondary | ICD-10-CM | POA: Diagnosis not present

## 2018-09-01 DIAGNOSIS — M5386 Other specified dorsopathies, lumbar region: Secondary | ICD-10-CM | POA: Diagnosis not present

## 2018-09-01 DIAGNOSIS — M9902 Segmental and somatic dysfunction of thoracic region: Secondary | ICD-10-CM | POA: Diagnosis not present

## 2018-09-01 DIAGNOSIS — M5414 Radiculopathy, thoracic region: Secondary | ICD-10-CM | POA: Diagnosis not present

## 2018-09-01 DIAGNOSIS — M9903 Segmental and somatic dysfunction of lumbar region: Secondary | ICD-10-CM | POA: Diagnosis not present

## 2018-09-01 DIAGNOSIS — M9905 Segmental and somatic dysfunction of pelvic region: Secondary | ICD-10-CM | POA: Diagnosis not present

## 2018-09-01 DIAGNOSIS — S338XXA Sprain of other parts of lumbar spine and pelvis, initial encounter: Secondary | ICD-10-CM | POA: Diagnosis not present

## 2018-09-08 ENCOUNTER — Other Ambulatory Visit: Payer: Self-pay | Admitting: Internal Medicine

## 2018-09-08 DIAGNOSIS — M9902 Segmental and somatic dysfunction of thoracic region: Secondary | ICD-10-CM | POA: Diagnosis not present

## 2018-09-08 DIAGNOSIS — M5386 Other specified dorsopathies, lumbar region: Secondary | ICD-10-CM | POA: Diagnosis not present

## 2018-09-08 DIAGNOSIS — M9905 Segmental and somatic dysfunction of pelvic region: Secondary | ICD-10-CM | POA: Diagnosis not present

## 2018-09-08 DIAGNOSIS — Z1231 Encounter for screening mammogram for malignant neoplasm of breast: Secondary | ICD-10-CM

## 2018-09-08 DIAGNOSIS — M5414 Radiculopathy, thoracic region: Secondary | ICD-10-CM | POA: Diagnosis not present

## 2018-09-08 DIAGNOSIS — M9903 Segmental and somatic dysfunction of lumbar region: Secondary | ICD-10-CM | POA: Diagnosis not present

## 2018-09-08 DIAGNOSIS — S338XXA Sprain of other parts of lumbar spine and pelvis, initial encounter: Secondary | ICD-10-CM | POA: Diagnosis not present

## 2018-09-22 DIAGNOSIS — S338XXA Sprain of other parts of lumbar spine and pelvis, initial encounter: Secondary | ICD-10-CM | POA: Diagnosis not present

## 2018-09-22 DIAGNOSIS — M5414 Radiculopathy, thoracic region: Secondary | ICD-10-CM | POA: Diagnosis not present

## 2018-09-22 DIAGNOSIS — M9903 Segmental and somatic dysfunction of lumbar region: Secondary | ICD-10-CM | POA: Diagnosis not present

## 2018-09-22 DIAGNOSIS — M9905 Segmental and somatic dysfunction of pelvic region: Secondary | ICD-10-CM | POA: Diagnosis not present

## 2018-09-22 DIAGNOSIS — M5386 Other specified dorsopathies, lumbar region: Secondary | ICD-10-CM | POA: Diagnosis not present

## 2018-09-22 DIAGNOSIS — M9902 Segmental and somatic dysfunction of thoracic region: Secondary | ICD-10-CM | POA: Diagnosis not present

## 2018-09-29 DIAGNOSIS — M9905 Segmental and somatic dysfunction of pelvic region: Secondary | ICD-10-CM | POA: Diagnosis not present

## 2018-09-29 DIAGNOSIS — M5414 Radiculopathy, thoracic region: Secondary | ICD-10-CM | POA: Diagnosis not present

## 2018-09-29 DIAGNOSIS — S338XXA Sprain of other parts of lumbar spine and pelvis, initial encounter: Secondary | ICD-10-CM | POA: Diagnosis not present

## 2018-09-29 DIAGNOSIS — M5386 Other specified dorsopathies, lumbar region: Secondary | ICD-10-CM | POA: Diagnosis not present

## 2018-09-29 DIAGNOSIS — M9903 Segmental and somatic dysfunction of lumbar region: Secondary | ICD-10-CM | POA: Diagnosis not present

## 2018-09-29 DIAGNOSIS — M9902 Segmental and somatic dysfunction of thoracic region: Secondary | ICD-10-CM | POA: Diagnosis not present

## 2018-10-06 DIAGNOSIS — M9903 Segmental and somatic dysfunction of lumbar region: Secondary | ICD-10-CM | POA: Diagnosis not present

## 2018-10-06 DIAGNOSIS — S338XXA Sprain of other parts of lumbar spine and pelvis, initial encounter: Secondary | ICD-10-CM | POA: Diagnosis not present

## 2018-10-06 DIAGNOSIS — M9902 Segmental and somatic dysfunction of thoracic region: Secondary | ICD-10-CM | POA: Diagnosis not present

## 2018-10-06 DIAGNOSIS — M9905 Segmental and somatic dysfunction of pelvic region: Secondary | ICD-10-CM | POA: Diagnosis not present

## 2018-10-06 DIAGNOSIS — M5414 Radiculopathy, thoracic region: Secondary | ICD-10-CM | POA: Diagnosis not present

## 2018-10-06 DIAGNOSIS — M5386 Other specified dorsopathies, lumbar region: Secondary | ICD-10-CM | POA: Diagnosis not present

## 2018-10-06 NOTE — Progress Notes (Signed)
Subjective:    Patient ID: Margaret Craig, female    DOB: 1945-07-07, 73 y.o.   MRN: [id number]  HPI She is here for a physical exam.   She is seeing a chiropractor and it helped, but  Now she is having back pain that he is not able to fix.  She is having lower back pain in the center of her back.  She denies radiation of the pain and no N/T in her legs.    She takes her BP at home and it has been less than 126/? And it was only 126 once.  She denies side effects from the medication.    Medications and allergies reviewed with patient and updated if appropriate.  Patient Active Problem List   Diagnosis Date Noted  . Hypertension 08/18/2018  . Low TSH level 07/04/2017  . Osteopenia 06/29/2017    Current Outpatient Medications on File Prior to Visit  Medication Sig Dispense Refill  . Ascorbic Acid (VITAMIN C PO) Take by mouth.    Marland Kitchen BIOTIN PO Take by mouth.    Marland Kitchen lisinopril (ZESTRIL) 10 MG tablet Take 1 tablet (10 mg total) by mouth daily. 30 tablet 3  . Multiple Vitamins-Minerals (MULTIVITAMIN WITH MINERALS) tablet Take 1 tablet by mouth daily.     No current facility-administered medications on file prior to visit.     Past Medical History:  Diagnosis Date  . Fainting spell   . Thyroid disease   . UTI (urinary tract infection)     Past Surgical History:  Procedure Laterality Date  . SINUS IRRIGATION    . TONSILLECTOMY      Social History   Socioeconomic History  . Marital status: Married    Spouse name: Not on file  . Number of children: Not on file  . Years of education: Not on file  . Highest education level: Not on file  Occupational History  . Not on file  Social Needs  . Financial resource strain: Not on file  . Food insecurity    Worry: Not on file    Inability: Not on file  . Transportation needs    Medical: Not on file    Non-medical: Not on file  Tobacco Use  . Smoking status: Never Smoker  . Smokeless tobacco: Never Used  Substance and  Sexual Activity  . Alcohol use: Yes    Alcohol/week: 6.0 standard drinks    Types: 6 Standard drinks or equivalent per week  . Drug use: Not on file  . Sexual activity: Not on file  Lifestyle  . Physical activity    Days per week: Not on file    Minutes per session: Not on file  . Stress: Not on file  Relationships  . Social Herbalist on phone: Not on file    Gets together: Not on file    Attends religious service: Not on file    Active member of club or organization: Not on file    Attends meetings of clubs or organizations: Not on file    Relationship status: Not on file  Other Topics Concern  . Not on file  Social History Narrative  . Not on file    Family History  Problem Relation Age of Onset  . Mental illness Mother   . Colon cancer Mother   . Alcohol abuse Son   . Mental illness Son   . Cancer Maternal Aunt        breast  .  Heart disease Maternal Aunt   . Colon cancer Maternal Aunt   . Heart disease Paternal Aunt   . Alcohol abuse Paternal Uncle   . Cancer Maternal Grandmother        breast  . Colon cancer Maternal Grandmother   . Heart disease Maternal Grandfather     Review of Systems  Constitutional: Negative for chills and fever.  Eyes: Negative for visual disturbance.  Respiratory: Negative for cough, shortness of breath and wheezing.   Cardiovascular: Negative for chest pain, palpitations and leg swelling.  Gastrointestinal: Negative for abdominal pain, blood in stool, constipation, diarrhea and nausea.       No gerd  Genitourinary: Negative for dysuria and hematuria.  Musculoskeletal: Positive for back pain. Negative for arthralgias.  Skin: Negative for color change and rash.  Neurological: Positive for numbness (tingling in hands at times). Negative for dizziness, light-headedness and headaches.  Psychiatric/Behavioral: Negative for dysphoric mood. The patient is not nervous/anxious.        Objective:   Vitals:   10/07/18 1445   BP: 116/70  Pulse: (!) 58  Resp: 16  Temp: 98 F (36.7 C)  SpO2: 99%   Filed Weights   10/07/18 1445  Weight: 110 lb 12.8 oz (50.3 kg)   Body mass index is 19.02 kg/m.  BP Readings from Last 3 Encounters:  10/07/18 116/70  08/18/18 (!) 168/100  07/01/17 114/82    Wt Readings from Last 3 Encounters:  10/07/18 110 lb 12.8 oz (50.3 kg)  08/18/18 111 lb 12.8 oz (50.7 kg)  07/01/17 108 lb (49 kg)     Physical Exam Constitutional: She appears well-developed and well-nourished. No distress.  HENT:  Head: Normocephalic and atraumatic.  Right Ear: External ear normal. Normal ear canal and TM Left Ear: External ear normal.  Normal ear canal and TM Mouth/Throat: Oropharynx is clear and moist.  Eyes: Conjunctivae and EOM are normal.  Neck: Neck supple. No tracheal deviation present. No thyromegaly present.  No carotid bruit  Cardiovascular: Normal rate, regular rhythm and normal heart sounds.   No murmur heard.  No edema. Pulmonary/Chest: Effort normal and breath sounds normal. No respiratory distress. She has no wheezes. She has no rales.  Breast: deferred   Abdominal: Soft. She exhibits no distension. There is no tenderness.  Lymphadenopathy: She has no cervical adenopathy.  Skin: Skin is warm and dry. She is not diaphoretic.  Psychiatric: She has a normal mood and affect. Her behavior is normal.        Assessment & Plan:   Physical exam: Screening blood work ordered Immunizations  Discussed tdap - will get it at a pharmacy, others up to date Colonoscopy    Due - referred to GI  - mom died of colon cancer last year - done when she was 43 Mammogram   Up to date  - scheduled Gyn  N/A - no longer seeing Dexa   Due  - ordered Eye exams   Up to date  Exercise   walking Weight  Normal BMI Skin  Sees derm annually, no concerns Substance abuse  none  See Problem List for Assessment and Plan of chronic medical problems.   FU in one year

## 2018-10-06 NOTE — Patient Instructions (Addendum)
Have a x-ray today.   All other Health Maintenance issues reviewed.   All recommended immunizations and age-appropriate screenings are up-to-date or discussed.  No immunizations administered today.   Medications reviewed and updated.  Changes include :   Try 5 mg of the lisinopril.   Please followup in one year   Health Maintenance, Female Adopting a healthy lifestyle and getting preventive care can go a long way to promote health and wellness. Talk with your health care provider about what schedule of regular examinations is right for you. This is a good chance for you to check in with your provider about disease prevention and staying healthy. In between checkups, there are plenty of things you can do on your own. Experts have done a lot of research about which lifestyle changes and preventive measures are most likely to keep you healthy. Ask your health care provider for more information. Weight and diet Eat a healthy diet  Be sure to include plenty of vegetables, fruits, low-fat dairy products, and lean protein.  Do not eat a lot of foods high in solid fats, added sugars, or salt.  Get regular exercise. This is one of the most important things you can do for your health. ? Most adults should exercise for at least 150 minutes each week. The exercise should increase your heart rate and make you sweat (moderate-intensity exercise). ? Most adults should also do strengthening exercises at least twice a week. This is in addition to the moderate-intensity exercise. Maintain a healthy weight  Body mass index (BMI) is a measurement that can be used to identify possible weight problems. It estimates body fat based on height and weight. Your health care provider can help determine your BMI and help you achieve or maintain a healthy weight.  For females 12 years of age and older: ? A BMI below 18.5 is considered underweight. ? A BMI of 18.5 to 24.9 is normal. ? A BMI of 25 to 29.9 is  considered overweight. ? A BMI of 30 and above is considered obese. Watch levels of cholesterol and blood lipids  You should start having your blood tested for lipids and cholesterol at 74 years of age, then have this test every 5 years.  You may need to have your cholesterol levels checked more often if: ? Your lipid or cholesterol levels are high. ? You are older than 74 years of age. ? You are at high risk for heart disease. Cancer screening Lung Cancer  Lung cancer screening is recommended for adults 57-47 years old who are at high risk for lung cancer because of a history of smoking.  A yearly low-dose CT scan of the lungs is recommended for people who: ? Currently smoke. ? Have quit within the past 15 years. ? Have at least a 30-pack-year history of smoking. A pack year is smoking an average of one pack of cigarettes a day for 1 year.  Yearly screening should continue until it has been 15 years since you quit.  Yearly screening should stop if you develop a health problem that would prevent you from having lung cancer treatment. Breast Cancer  Practice breast self-awareness. This means understanding how your breasts normally appear and feel.  It also means doing regular breast self-exams. Let your health care provider know about any changes, no matter how small.  If you are in your 20s or 30s, you should have a clinical breast exam (CBE) by a health care provider every 1-3 years as part  of a regular health exam.  If you are 38 or older, have a CBE every year. Also consider having a breast X-ray (mammogram) every year.  If you have a family history of breast cancer, talk to your health care provider about genetic screening.  If you are at high risk for breast cancer, talk to your health care provider about having an MRI and a mammogram every year.  Breast cancer gene (BRCA) assessment is recommended for women who have family members with BRCA-related cancers. BRCA-related  cancers include: ? Breast. ? Ovarian. ? Tubal. ? Peritoneal cancers.  Results of the assessment will determine the need for genetic counseling and BRCA1 and BRCA2 testing. Cervical Cancer Your health care provider may recommend that you be screened regularly for cancer of the pelvic organs (ovaries, uterus, and vagina). This screening involves a pelvic examination, including checking for microscopic changes to the surface of your cervix (Pap test). You may be encouraged to have this screening done every 3 years, beginning at age 27.  For women ages 9-65, health care providers may recommend pelvic exams and Pap testing every 3 years, or they may recommend the Pap and pelvic exam, combined with testing for human papilloma virus (HPV), every 5 years. Some types of HPV increase your risk of cervical cancer. Testing for HPV may also be done on women of any age with unclear Pap test results.  Other health care providers may not recommend any screening for nonpregnant women who are considered low risk for pelvic cancer and who do not have symptoms. Ask your health care provider if a screening pelvic exam is right for you.  If you have had past treatment for cervical cancer or a condition that could lead to cancer, you need Pap tests and screening for cancer for at least 20 years after your treatment. If Pap tests have been discontinued, your risk factors (such as having a new sexual partner) need to be reassessed to determine if screening should resume. Some women have medical problems that increase the chance of getting cervical cancer. In these cases, your health care provider may recommend more frequent screening and Pap tests. Colorectal Cancer  This type of cancer can be detected and often prevented.  Routine colorectal cancer screening usually begins at 74 years of age and continues through 74 years of age.  Your health care provider may recommend screening at an earlier age if you have risk  factors for colon cancer.  Your health care provider may also recommend using home test kits to check for hidden blood in the stool.  A small camera at the end of a tube can be used to examine your colon directly (sigmoidoscopy or colonoscopy). This is done to check for the earliest forms of colorectal cancer.  Routine screening usually begins at age 42.  Direct examination of the colon should be repeated every 5-10 years through 74 years of age. However, you may need to be screened more often if early forms of precancerous polyps or small growths are found. Skin Cancer  Check your skin from head to toe regularly.  Tell your health care provider about any new moles or changes in moles, especially if there is a change in a mole's shape or color.  Also tell your health care provider if you have a mole that is larger than the size of a pencil eraser.  Always use sunscreen. Apply sunscreen liberally and repeatedly throughout the day.  Protect yourself by wearing long sleeves, pants, a  wide-brimmed hat, and sunglasses whenever you are outside. Heart disease, diabetes, and high blood pressure  High blood pressure causes heart disease and increases the risk of stroke. High blood pressure is more likely to develop in: ? People who have blood pressure in the high end of the normal range (130-139/85-89 mm Hg). ? People who are overweight or obese. ? People who are African American.  If you are 12-62 years of age, have your blood pressure checked every 3-5 years. If you are 70 years of age or older, have your blood pressure checked every year. You should have your blood pressure measured twice-once when you are at a hospital or clinic, and once when you are not at a hospital or clinic. Record the average of the two measurements. To check your blood pressure when you are not at a hospital or clinic, you can use: ? An automated blood pressure machine at a pharmacy. ? A home blood pressure  monitor.  If you are between 58 years and 62 years old, ask your health care provider if you should take aspirin to prevent strokes.  Have regular diabetes screenings. This involves taking a blood sample to check your fasting blood sugar level. ? If you are at a normal weight and have a low risk for diabetes, have this test once every three years after 74 years of age. ? If you are overweight and have a high risk for diabetes, consider being tested at a younger age or more often. Preventing infection Hepatitis B  If you have a higher risk for hepatitis B, you should be screened for this virus. You are considered at high risk for hepatitis B if: ? You were born in a country where hepatitis B is common. Ask your health care provider which countries are considered high risk. ? Your parents were born in a high-risk country, and you have not been immunized against hepatitis B (hepatitis B vaccine). ? You have HIV or AIDS. ? You use needles to inject street drugs. ? You live with someone who has hepatitis B. ? You have had sex with someone who has hepatitis B. ? You get hemodialysis treatment. ? You take certain medicines for conditions, including cancer, organ transplantation, and autoimmune conditions. Hepatitis C  Blood testing is recommended for: ? Everyone born from [birth year] through 1966. ? Anyone with known risk factors for hepatitis C. Sexually transmitted infections (STIs)  You should be screened for sexually transmitted infections (STIs) including gonorrhea and chlamydia if: ? You are sexually active and are younger than 74 years of age. ? You are older than 74 years of age and your health care provider tells you that you are at risk for this type of infection. ? Your sexual activity has changed since you were last screened and you are at an increased risk for chlamydia or gonorrhea. Ask your health care provider if you are at risk.  If you do not have HIV, but are at risk, it may be  recommended that you take a prescription medicine daily to prevent HIV infection. This is called pre-exposure prophylaxis (PrEP). You are considered at risk if: ? You are sexually active and do not regularly use condoms or know the HIV status of your partner(s). ? You take drugs by injection. ? You are sexually active with a partner who has HIV. Talk with your health care provider about whether you are at high risk of being infected with HIV. If you choose to begin PrEP, you should  first be tested for HIV. You should then be tested every 3 months for as long as you are taking PrEP. Pregnancy  If you are premenopausal and you may become pregnant, ask your health care provider about preconception counseling.  If you may become pregnant, take 400 to 800 micrograms (mcg) of folic acid every day.  If you want to prevent pregnancy, talk to your health care provider about birth control (contraception). Osteoporosis and menopause  Osteoporosis is a disease in which the bones lose minerals and strength with aging. This can result in serious bone fractures. Your risk for osteoporosis can be identified using a bone density scan.  If you are 43 years of age or older, or if you are at risk for osteoporosis and fractures, ask your health care provider if you should be screened.  Ask your health care provider whether you should take a calcium or vitamin D supplement to lower your risk for osteoporosis.  Menopause may have certain physical symptoms and risks.  Hormone replacement therapy may reduce some of these symptoms and risks. Talk to your health care provider about whether hormone replacement therapy is right for you. Follow these instructions at home:  Schedule regular health, dental, and eye exams.  Stay current with your immunizations.  Do not use any tobacco products including cigarettes, chewing tobacco, or electronic cigarettes.  If you are pregnant, do not drink alcohol.  If you are  breastfeeding, limit how much and how often you drink alcohol.  Limit alcohol intake to no more than 1 drink per day for nonpregnant women. One drink equals 12 ounces of beer, 5 ounces of wine, or 1 ounces of hard liquor.  Do not use street drugs.  Do not share needles.  Ask your health care provider for help if you need support or information about quitting drugs.  Tell your health care provider if you often feel depressed.  Tell your health care provider if you have ever been abused or do not feel safe at home. This information is not intended to replace advice given to you by your health care provider. Make sure you discuss any questions you have with your health care provider. Document Released: 10/22/2010 Document Revised: 09/14/2015 Document Reviewed: 01/10/2015 Elsevier Interactive Patient Education  2019 [employer].

## 2018-10-07 ENCOUNTER — Ambulatory Visit (INDEPENDENT_AMBULATORY_CARE_PROVIDER_SITE_OTHER)
Admission: RE | Admit: 2018-10-07 | Discharge: 2018-10-07 | Disposition: A | Discharge status: Home / Self Care | Payer: Medicare HMO | Source: Ambulatory Visit | Attending: Internal Medicine | Admitting: Internal Medicine

## 2018-10-07 ENCOUNTER — Other Ambulatory Visit: Payer: Self-pay

## 2018-10-07 ENCOUNTER — Encounter: Payer: Self-pay | Admitting: Internal Medicine

## 2018-10-07 ENCOUNTER — Ambulatory Visit (INDEPENDENT_AMBULATORY_CARE_PROVIDER_SITE_OTHER): Payer: Medicare HMO | Admitting: Internal Medicine

## 2018-10-07 VITALS — BP 116/70 | HR 58 | Temp 98.0°F | Resp 16 | Ht 64.0 in | Wt 110.8 lb

## 2018-10-07 DIAGNOSIS — G8929 Other chronic pain: Secondary | ICD-10-CM | POA: Diagnosis not present

## 2018-10-07 DIAGNOSIS — M545 Low back pain, unspecified: Secondary | ICD-10-CM

## 2018-10-07 DIAGNOSIS — R7989 Other specified abnormal findings of blood chemistry: Secondary | ICD-10-CM | POA: Diagnosis not present

## 2018-10-07 DIAGNOSIS — Z0000 Encounter for general adult medical examination without abnormal findings: Secondary | ICD-10-CM | POA: Diagnosis not present

## 2018-10-07 DIAGNOSIS — M85852 Other specified disorders of bone density and structure, left thigh: Secondary | ICD-10-CM | POA: Diagnosis not present

## 2018-10-07 DIAGNOSIS — Z1211 Encounter for screening for malignant neoplasm of colon: Secondary | ICD-10-CM

## 2018-10-07 DIAGNOSIS — I10 Essential (primary) hypertension: Secondary | ICD-10-CM | POA: Diagnosis not present

## 2018-10-07 NOTE — Assessment & Plan Note (Signed)
dexa due - ordered for breast center Taking a MVI Walking regularly

## 2018-10-07 NOTE — Assessment & Plan Note (Signed)
Repeat tfts normal

## 2018-10-07 NOTE — Assessment & Plan Note (Addendum)
Well controlled Will try only 5 mg of lisinopril daily and monitor BP - if controlled will continue 5 mg daily -- if not controlled will increase back to 10 mg Continue low sodium diet and regular exercise

## 2018-10-07 NOTE — Assessment & Plan Note (Signed)
Sees a chiropractor  - still having chronic lower back pain Will get xray If pain persists consider referral to sports med or ortho for further evaluation

## 2018-10-09 ENCOUNTER — Telehealth: Payer: Self-pay | Admitting: Internal Medicine

## 2018-10-09 ENCOUNTER — Telehealth: Payer: Self-pay | Admitting: *Deleted

## 2018-10-09 NOTE — Telephone Encounter (Signed)
Ok to send copy

## 2018-10-09 NOTE — Telephone Encounter (Signed)
Pt was told she would get a call back regarding recommendation of anti-inflamatory to take for back.   Please call If needed pharm is [facility] [address]

## 2018-10-09 NOTE — Telephone Encounter (Signed)
Copied from [place] [id number]. Topic: General - Other >> Oct 09, 2018  3:15 PM Nils Flack, Marland Kitchen wrote: Reason for CRM: pt would like to get copy of xray report to take to chiropractor

## 2018-10-12 NOTE — Telephone Encounter (Signed)
Spoke with pt in regards  

## 2018-10-12 NOTE — Telephone Encounter (Signed)
Printed written copy and set it up front. Put med rec phone number on printed copy for her to contact and get disk.

## 2018-10-13 ENCOUNTER — Telehealth: Payer: Self-pay | Admitting: Gastroenterology

## 2018-10-13 DIAGNOSIS — S338XXA Sprain of other parts of lumbar spine and pelvis, initial encounter: Secondary | ICD-10-CM | POA: Diagnosis not present

## 2018-10-13 DIAGNOSIS — M5386 Other specified dorsopathies, lumbar region: Secondary | ICD-10-CM | POA: Diagnosis not present

## 2018-10-13 DIAGNOSIS — M9903 Segmental and somatic dysfunction of lumbar region: Secondary | ICD-10-CM | POA: Diagnosis not present

## 2018-10-13 DIAGNOSIS — M5414 Radiculopathy, thoracic region: Secondary | ICD-10-CM | POA: Diagnosis not present

## 2018-10-13 DIAGNOSIS — M9902 Segmental and somatic dysfunction of thoracic region: Secondary | ICD-10-CM | POA: Diagnosis not present

## 2018-10-13 DIAGNOSIS — M9905 Segmental and somatic dysfunction of pelvic region: Secondary | ICD-10-CM | POA: Diagnosis not present

## 2018-10-13 NOTE — Telephone Encounter (Signed)
Per patient request the patient would like to transfer GI to see Dr. Fuller Plan. Doctor Fuller Plan wa her husbands doctor before he passed away and she would like to keep all her doctors within [facility]. Patient is being referred to have another colonoscopy since her mother died last year due to colon cancer. Patient last colonoscopy was 18yrs ago.  Records will be sent for review.

## 2018-10-27 ENCOUNTER — Other Ambulatory Visit: Payer: Self-pay

## 2018-10-27 ENCOUNTER — Ambulatory Visit
Admission: RE | Admit: 2018-10-27 | Discharge: 2018-10-27 | Disposition: A | Discharge status: Home / Self Care | Payer: Medicare HMO | Source: Ambulatory Visit | Attending: Internal Medicine | Admitting: Internal Medicine

## 2018-10-27 DIAGNOSIS — Z1231 Encounter for screening mammogram for malignant neoplasm of breast: Secondary | ICD-10-CM

## 2018-10-27 DIAGNOSIS — S338XXA Sprain of other parts of lumbar spine and pelvis, initial encounter: Secondary | ICD-10-CM | POA: Diagnosis not present

## 2018-10-27 DIAGNOSIS — M9902 Segmental and somatic dysfunction of thoracic region: Secondary | ICD-10-CM | POA: Diagnosis not present

## 2018-10-27 DIAGNOSIS — M5414 Radiculopathy, thoracic region: Secondary | ICD-10-CM | POA: Diagnosis not present

## 2018-10-27 DIAGNOSIS — M9905 Segmental and somatic dysfunction of pelvic region: Secondary | ICD-10-CM | POA: Diagnosis not present

## 2018-10-27 DIAGNOSIS — M9903 Segmental and somatic dysfunction of lumbar region: Secondary | ICD-10-CM | POA: Diagnosis not present

## 2018-10-27 DIAGNOSIS — M5386 Other specified dorsopathies, lumbar region: Secondary | ICD-10-CM | POA: Diagnosis not present

## 2018-10-30 NOTE — Telephone Encounter (Signed)
Records reprinted and placed in Dr. Lynne Leader box for review.

## 2018-10-30 NOTE — Telephone Encounter (Signed)
Please check on status of records.

## 2018-10-30 NOTE — Telephone Encounter (Signed)
Never received these records or was sent this message 2 weeks ago when patient contacted office. Have Pierre Bali check status of records please.

## 2018-11-03 DIAGNOSIS — M9903 Segmental and somatic dysfunction of lumbar region: Secondary | ICD-10-CM | POA: Diagnosis not present

## 2018-11-03 DIAGNOSIS — M5414 Radiculopathy, thoracic region: Secondary | ICD-10-CM | POA: Diagnosis not present

## 2018-11-03 DIAGNOSIS — M9902 Segmental and somatic dysfunction of thoracic region: Secondary | ICD-10-CM | POA: Diagnosis not present

## 2018-11-03 DIAGNOSIS — M9905 Segmental and somatic dysfunction of pelvic region: Secondary | ICD-10-CM | POA: Diagnosis not present

## 2018-11-03 DIAGNOSIS — S338XXA Sprain of other parts of lumbar spine and pelvis, initial encounter: Secondary | ICD-10-CM | POA: Diagnosis not present

## 2018-11-03 DIAGNOSIS — M5386 Other specified dorsopathies, lumbar region: Secondary | ICD-10-CM | POA: Diagnosis not present

## 2018-11-10 DIAGNOSIS — M9905 Segmental and somatic dysfunction of pelvic region: Secondary | ICD-10-CM | POA: Diagnosis not present

## 2018-11-10 DIAGNOSIS — M9902 Segmental and somatic dysfunction of thoracic region: Secondary | ICD-10-CM | POA: Diagnosis not present

## 2018-11-10 DIAGNOSIS — M5414 Radiculopathy, thoracic region: Secondary | ICD-10-CM | POA: Diagnosis not present

## 2018-11-10 DIAGNOSIS — M9903 Segmental and somatic dysfunction of lumbar region: Secondary | ICD-10-CM | POA: Diagnosis not present

## 2018-11-10 DIAGNOSIS — S338XXA Sprain of other parts of lumbar spine and pelvis, initial encounter: Secondary | ICD-10-CM | POA: Diagnosis not present

## 2018-11-10 DIAGNOSIS — M5386 Other specified dorsopathies, lumbar region: Secondary | ICD-10-CM | POA: Diagnosis not present

## 2018-11-17 ENCOUNTER — Other Ambulatory Visit: Payer: Self-pay

## 2018-11-17 ENCOUNTER — Ambulatory Visit
Admission: RE | Admit: 2018-11-17 | Discharge: 2018-11-17 | Disposition: A | Discharge status: Home / Self Care | Payer: Medicare HMO | Source: Ambulatory Visit | Attending: Internal Medicine | Admitting: Internal Medicine

## 2018-11-17 DIAGNOSIS — M9903 Segmental and somatic dysfunction of lumbar region: Secondary | ICD-10-CM | POA: Diagnosis not present

## 2018-11-17 DIAGNOSIS — S338XXA Sprain of other parts of lumbar spine and pelvis, initial encounter: Secondary | ICD-10-CM | POA: Diagnosis not present

## 2018-11-17 DIAGNOSIS — Z780 Asymptomatic menopausal state: Secondary | ICD-10-CM | POA: Diagnosis not present

## 2018-11-17 DIAGNOSIS — M8589 Other specified disorders of bone density and structure, multiple sites: Secondary | ICD-10-CM | POA: Diagnosis not present

## 2018-11-17 DIAGNOSIS — M5386 Other specified dorsopathies, lumbar region: Secondary | ICD-10-CM | POA: Diagnosis not present

## 2018-11-17 DIAGNOSIS — M9902 Segmental and somatic dysfunction of thoracic region: Secondary | ICD-10-CM | POA: Diagnosis not present

## 2018-11-17 DIAGNOSIS — M5414 Radiculopathy, thoracic region: Secondary | ICD-10-CM | POA: Diagnosis not present

## 2018-11-17 DIAGNOSIS — M9905 Segmental and somatic dysfunction of pelvic region: Secondary | ICD-10-CM | POA: Diagnosis not present

## 2018-11-17 DIAGNOSIS — M85852 Other specified disorders of bone density and structure, left thigh: Secondary | ICD-10-CM

## 2018-11-19 ENCOUNTER — Other Ambulatory Visit: Payer: Self-pay

## 2018-11-19 MED ORDER — LISINOPRIL 10 MG PO TABS: 10.0000 mg | ORAL_TABLET | Freq: Every day | ORAL | 1 refills | Status: DC

## 2018-12-01 DIAGNOSIS — M9902 Segmental and somatic dysfunction of thoracic region: Secondary | ICD-10-CM | POA: Diagnosis not present

## 2018-12-01 DIAGNOSIS — M9905 Segmental and somatic dysfunction of pelvic region: Secondary | ICD-10-CM | POA: Diagnosis not present

## 2018-12-01 DIAGNOSIS — M9903 Segmental and somatic dysfunction of lumbar region: Secondary | ICD-10-CM | POA: Diagnosis not present

## 2018-12-01 DIAGNOSIS — M5386 Other specified dorsopathies, lumbar region: Secondary | ICD-10-CM | POA: Diagnosis not present

## 2018-12-01 DIAGNOSIS — M5414 Radiculopathy, thoracic region: Secondary | ICD-10-CM | POA: Diagnosis not present

## 2018-12-01 DIAGNOSIS — S338XXA Sprain of other parts of lumbar spine and pelvis, initial encounter: Secondary | ICD-10-CM | POA: Diagnosis not present

## 2018-12-08 DIAGNOSIS — R69 Illness, unspecified: Secondary | ICD-10-CM | POA: Diagnosis not present

## 2018-12-15 DIAGNOSIS — M9905 Segmental and somatic dysfunction of pelvic region: Secondary | ICD-10-CM | POA: Diagnosis not present

## 2018-12-15 DIAGNOSIS — M5386 Other specified dorsopathies, lumbar region: Secondary | ICD-10-CM | POA: Diagnosis not present

## 2018-12-15 DIAGNOSIS — M9902 Segmental and somatic dysfunction of thoracic region: Secondary | ICD-10-CM | POA: Diagnosis not present

## 2018-12-15 DIAGNOSIS — M9903 Segmental and somatic dysfunction of lumbar region: Secondary | ICD-10-CM | POA: Diagnosis not present

## 2018-12-15 DIAGNOSIS — S338XXA Sprain of other parts of lumbar spine and pelvis, initial encounter: Secondary | ICD-10-CM | POA: Diagnosis not present

## 2018-12-15 DIAGNOSIS — M5414 Radiculopathy, thoracic region: Secondary | ICD-10-CM | POA: Diagnosis not present

## 2018-12-22 ENCOUNTER — Encounter: Payer: Self-pay | Admitting: Gastroenterology

## 2018-12-29 DIAGNOSIS — M9903 Segmental and somatic dysfunction of lumbar region: Secondary | ICD-10-CM | POA: Diagnosis not present

## 2018-12-29 DIAGNOSIS — M5414 Radiculopathy, thoracic region: Secondary | ICD-10-CM | POA: Diagnosis not present

## 2018-12-29 DIAGNOSIS — M5386 Other specified dorsopathies, lumbar region: Secondary | ICD-10-CM | POA: Diagnosis not present

## 2018-12-29 DIAGNOSIS — M9902 Segmental and somatic dysfunction of thoracic region: Secondary | ICD-10-CM | POA: Diagnosis not present

## 2018-12-29 DIAGNOSIS — M9905 Segmental and somatic dysfunction of pelvic region: Secondary | ICD-10-CM | POA: Diagnosis not present

## 2018-12-29 DIAGNOSIS — S338XXA Sprain of other parts of lumbar spine and pelvis, initial encounter: Secondary | ICD-10-CM | POA: Diagnosis not present

## 2018-12-31 DIAGNOSIS — R69 Illness, unspecified: Secondary | ICD-10-CM | POA: Diagnosis not present

## 2019-01-19 ENCOUNTER — Encounter: Payer: Self-pay | Admitting: Gastroenterology

## 2019-01-19 ENCOUNTER — Ambulatory Visit (AMBULATORY_SURGERY_CENTER): Payer: Self-pay

## 2019-01-19 ENCOUNTER — Other Ambulatory Visit: Payer: Self-pay

## 2019-01-19 VITALS — Temp 96.9°F | Ht 64.0 in | Wt 108.4 lb

## 2019-01-19 DIAGNOSIS — M9905 Segmental and somatic dysfunction of pelvic region: Secondary | ICD-10-CM | POA: Diagnosis not present

## 2019-01-19 DIAGNOSIS — M9902 Segmental and somatic dysfunction of thoracic region: Secondary | ICD-10-CM | POA: Diagnosis not present

## 2019-01-19 DIAGNOSIS — Z800 Family history of malignant neoplasm of digestive organs: Secondary | ICD-10-CM

## 2019-01-19 DIAGNOSIS — M5386 Other specified dorsopathies, lumbar region: Secondary | ICD-10-CM | POA: Diagnosis not present

## 2019-01-19 DIAGNOSIS — M9903 Segmental and somatic dysfunction of lumbar region: Secondary | ICD-10-CM | POA: Diagnosis not present

## 2019-01-19 DIAGNOSIS — Z1211 Encounter for screening for malignant neoplasm of colon: Secondary | ICD-10-CM

## 2019-01-19 DIAGNOSIS — S338XXA Sprain of other parts of lumbar spine and pelvis, initial encounter: Secondary | ICD-10-CM | POA: Diagnosis not present

## 2019-01-19 DIAGNOSIS — M5414 Radiculopathy, thoracic region: Secondary | ICD-10-CM | POA: Diagnosis not present

## 2019-01-19 MED ORDER — NA SULFATE-K SULFATE-MG SULF 17.5-3.13-1.6 GM/177ML PO SOLN: 1.0000 | Freq: Once | ORAL | 0 refills | Status: AC

## 2019-01-19 NOTE — Progress Notes (Signed)
Denies allergies to eggs or soy products. Denies complication of anesthesia or sedation. Denies use of weight loss medication. Denies use of O2.   Emmi instructions given for colonoscopy.  

## 2019-01-29 DIAGNOSIS — H5200 Hypermetropia, unspecified eye: Secondary | ICD-10-CM | POA: Diagnosis not present

## 2019-01-29 DIAGNOSIS — Z135 Encounter for screening for eye and ear disorders: Secondary | ICD-10-CM | POA: Diagnosis not present

## 2019-01-29 DIAGNOSIS — I10 Essential (primary) hypertension: Secondary | ICD-10-CM | POA: Diagnosis not present

## 2019-01-29 DIAGNOSIS — Z0100 Encounter for examination of eyes and vision without abnormal findings: Secondary | ICD-10-CM | POA: Diagnosis not present

## 2019-01-29 DIAGNOSIS — H259 Unspecified age-related cataract: Secondary | ICD-10-CM | POA: Diagnosis not present

## 2019-02-01 ENCOUNTER — Telehealth: Payer: Self-pay

## 2019-02-01 NOTE — Telephone Encounter (Signed)
Covid-19 screening questions   Do you now or have you had a fever in the last 14 days? NO   Do you have any respiratory symptoms of shortness of breath or cough now or in the last 14 days? NO  Do you have any family members or close contacts with diagnosed or suspected Covid-19 in the past 14 days? NO  Have you been tested for Covid-19 and found to be positive? NO        

## 2019-02-02 ENCOUNTER — Other Ambulatory Visit: Payer: Self-pay | Admitting: Gastroenterology

## 2019-02-02 ENCOUNTER — Ambulatory Visit (AMBULATORY_SURGERY_CENTER): Payer: Medicare HMO | Admitting: Gastroenterology

## 2019-02-02 ENCOUNTER — Other Ambulatory Visit: Payer: Self-pay

## 2019-02-02 ENCOUNTER — Encounter: Payer: Self-pay | Admitting: Gastroenterology

## 2019-02-02 VITALS — BP 122/77 | HR 63 | Temp 97.8°F | Resp 19 | Ht 64.0 in | Wt 108.4 lb

## 2019-02-02 DIAGNOSIS — Z1211 Encounter for screening for malignant neoplasm of colon: Secondary | ICD-10-CM | POA: Diagnosis not present

## 2019-02-02 DIAGNOSIS — D124 Benign neoplasm of descending colon: Secondary | ICD-10-CM

## 2019-02-02 DIAGNOSIS — D125 Benign neoplasm of sigmoid colon: Secondary | ICD-10-CM | POA: Diagnosis not present

## 2019-02-02 DIAGNOSIS — D123 Benign neoplasm of transverse colon: Secondary | ICD-10-CM | POA: Diagnosis not present

## 2019-02-02 MED ORDER — SODIUM CHLORIDE 0.9 % IV SOLN: 500.0000 mL | INTRAVENOUS | Status: DC

## 2019-02-02 NOTE — Op Note (Signed)
[place] Patient Name: Margaret Craig Procedure Date: 02/02/2019 7:58 AM MRN: [id number] Endoscopist: Ladene Artist , MD Age: 73 Referring MD:  Date of Birth: March 16, 1946 Gender: Female Account #: [id number] Procedure:                Colonoscopy Indications:              Screening for colorectal malignant neoplasm Medicines:                Monitored Anesthesia Care Procedure:                Pre-Anesthesia Assessment:                           - Prior to the procedure, a History and Physical                            was performed, and patient medications and                            allergies were reviewed. The patient's tolerance of                            previous anesthesia was also reviewed. The risks                            and benefits of the procedure and the sedation                            options and risks were discussed with the patient.                            All questions were answered, and informed consent                            was obtained. Prior Anticoagulants: The patient has                            taken no previous anticoagulant or antiplatelet                            agents. ASA Grade Assessment: II - A patient with                            mild systemic disease. After reviewing the risks                            and benefits, the patient was deemed in                            satisfactory condition to undergo the procedure.                           After obtaining informed consent, the colonoscope  was passed under direct vision. Throughout the                            procedure, the patient's blood pressure, pulse, and                            oxygen saturations were monitored continuously. The                            Colonoscope was introduced through the anus and                            advanced to the the cecum, identified by                            appendiceal orifice and  ileocecal valve. The                            ileocecal valve, appendiceal orifice, and rectum                            were photographed. The quality of the bowel                            preparation was good. The colonoscopy was performed                            without difficulty. The patient tolerated the                            procedure well. Scope In: 8:15:22 AM Scope Out: 8:34:23 AM Scope Withdrawal Time: 0 hours 15 minutes 23 seconds  Total Procedure Duration: 0 hours 19 minutes 1 second  Findings:                 The perianal and digital rectal examinations were                            normal.                           Two sessile polyps were found in the sigmoid colon                            and transverse colon. The polyps were 7 mm in size.                            These polyps were removed with a cold snare.                            Resection and retrieval were complete.                           A 4 mm polyp was found in the descending colon. The  polyp was sessile. The polyp was removed with a                            cold biopsy forceps. Resection and retrieval were                            complete.                           A few small-mouthed diverticula were found in the                            left colon.                           External and internal hemorrhoids were found during                            retroflexion. The hemorrhoids were small and Grade                            I (internal hemorrhoids that do not prolapse).                           The exam was otherwise without abnormality on                            direct and retroflexion views. Complications:            No immediate complications. Estimated blood loss:                            None. Estimated Blood Loss:     Estimated blood loss: none. Impression:               - Two 7 mm polyps in the sigmoid colon and in the                             transverse colon, removed with a cold snare.                            Resected and retrieved.                           - One 4 mm polyp in the descending colon, removed                            with a cold biopsy forceps. Resected and retrieved.                           - Mild diverticulosis in the left colon.                           - External and internal hemorrhoids.                           -  Otherwise normal appearing. Recommendation:           - Repeat colonoscopy date to be determined after                            pending pathology results are reviewed for                            surveillance based on pathology results.                           - Patient has a contact number available for                            emergencies. The signs and symptoms of potential                            delayed complications were discussed with the                            patient. Return to normal activities tomorrow.                            Written discharge instructions were provided to the                            patient.                           - High fiber diet.                           - Continue present medications.                           - Await pathology results. Ladene Artist, MD 02/02/2019 8:38:35 AM This report has been signed electronically.

## 2019-02-02 NOTE — Progress Notes (Signed)
To PACU, VSS. Report to [email or web address] 

## 2019-02-02 NOTE — Progress Notes (Signed)
Temperature- Margaret Craig  Pt's states no medical or surgical changes since previsit or office visit.  Pt's mother was age 74 when diagnosed with colon CA

## 2019-02-02 NOTE — Progress Notes (Signed)
Called to room to assist during endoscopic procedure.  Patient ID and intended procedure confirmed with present staff. Received instructions for my participation in the procedure from the performing physician.  

## 2019-02-02 NOTE — Patient Instructions (Signed)
Handouts on polyps,diverticulosis,hemorrhoids and high fiber diet given to you today  Await pathology results on polyps removed   Follow high fiber diet ( given to you )   YOU HAD AN ENDOSCOPIC PROCEDURE TODAY AT [facility]:   Refer to the procedure report that was given to you for any specific questions about what was found during the examination.  If the procedure report does not answer your questions, please call your gastroenterologist to clarify.  If you requested that your care partner not be given the details of your procedure findings, then the procedure report has been included in a sealed envelope for you to review at your convenience later.  YOU SHOULD EXPECT: Some feelings of bloating in the abdomen. Passage of more gas than usual.  Walking can help get rid of the air that was put into your GI tract during the procedure and reduce the bloating. If you had a lower endoscopy (such as a colonoscopy or flexible sigmoidoscopy) you may notice spotting of blood in your stool or on the toilet paper. If you underwent a bowel prep for your procedure, you may not have a normal bowel movement for a few days.  Please Note:  You might notice some irritation and congestion in your nose or some drainage.  This is from the oxygen used during your procedure.  There is no need for concern and it should clear up in a day or so.  SYMPTOMS TO REPORT IMMEDIATELY:   Following lower endoscopy (colonoscopy or flexible sigmoidoscopy):  Excessive amounts of blood in the stool  Significant tenderness or worsening of abdominal pains  Swelling of the abdomen that is new, acute  Fever of 100F or higher    For urgent or emergent issues, a gastroenterologist can be reached at any hour by calling [phone].   DIET:  We do recommend a small meal at first, but then you may proceed to your regular diet.  Drink plenty of fluids but you should avoid alcoholic beverages for 24  hours.  ACTIVITY:  You should plan to take it easy for the rest of today and you should NOT DRIVE or use heavy machinery until tomorrow (because of the sedation medicines used during the test).    FOLLOW UP: Our staff will call the number listed on your records 48-72 hours following your procedure to check on you and address any questions or concerns that you may have regarding the information given to you following your procedure. If we do not reach you, we will leave a message.  We will attempt to reach you two times.  During this call, we will ask if you have developed any symptoms of COVID 19. If you develop any symptoms (ie: fever, flu-like symptoms, shortness of breath, cough etc.) before then, please call [phone].  If you test positive for Covid 19 in the 2 weeks post procedure, please call and report this information to [place].    If any biopsies were taken you will be contacted by phone or by letter within the next 1-3 weeks.  Please call us at [phone] if you have not heard about the biopsies in 3 weeks.    SIGNATURES/CONFIDENTIALITY: You and/or your care partner have signed paperwork which will be entered into your electronic medical record.  These signatures attest to the fact that that the information above on your After Visit Summary has been reviewed and is understood.  Full responsibility of the confidentiality of this discharge information  lies with you and/or your care-partner.

## 2019-02-04 ENCOUNTER — Telehealth: Payer: Self-pay

## 2019-02-04 NOTE — Telephone Encounter (Signed)
  Follow up Call-  Call back number 02/02/2019  Post procedure Call Back phone  # [phone]  Permission to leave phone message Yes  Some recent data might be hidden     Patient questions:  Do you have a fever, pain , or abdominal swelling? No. Pain Score  0 *  Have you tolerated food without any problems? Yes.    Have you been able to return to your normal activities? Yes.    Do you have any questions about your discharge instructions: Diet   No. Medications  No. Follow up visit  No.  Do you have questions or concerns about your Care? No.  Actions: * If pain score is 4 or above: No action needed, pain <4. 1. Have you developed a fever since your procedure? no  2.   Have you had an respiratory symptoms (SOB or cough) since your procedure? no  3.   Have you tested positive for COVID 19 since your procedure no  4.   Have you had any family members/close contacts diagnosed with the COVID 19 since your procedure?  no   If yes to any of these questions please route to Joylene John, RN and Alphonsa Gin, Therapist, sports.

## 2019-02-09 ENCOUNTER — Encounter: Payer: Self-pay | Admitting: Gastroenterology

## 2019-02-09 DIAGNOSIS — M9902 Segmental and somatic dysfunction of thoracic region: Secondary | ICD-10-CM | POA: Diagnosis not present

## 2019-02-09 DIAGNOSIS — M5414 Radiculopathy, thoracic region: Secondary | ICD-10-CM | POA: Diagnosis not present

## 2019-02-09 DIAGNOSIS — M9903 Segmental and somatic dysfunction of lumbar region: Secondary | ICD-10-CM | POA: Diagnosis not present

## 2019-02-09 DIAGNOSIS — M9905 Segmental and somatic dysfunction of pelvic region: Secondary | ICD-10-CM | POA: Diagnosis not present

## 2019-02-09 DIAGNOSIS — M5386 Other specified dorsopathies, lumbar region: Secondary | ICD-10-CM | POA: Diagnosis not present

## 2019-02-09 DIAGNOSIS — S338XXA Sprain of other parts of lumbar spine and pelvis, initial encounter: Secondary | ICD-10-CM | POA: Diagnosis not present

## 2019-02-18 ENCOUNTER — Telehealth: Payer: Self-pay | Admitting: Gastroenterology

## 2019-02-18 NOTE — Telephone Encounter (Signed)
Pt received letter with path results but it did not specify when a recall date. Pt would like to know when she is due again.

## 2019-02-18 NOTE — Telephone Encounter (Signed)
Dr. Fuller Plan is there a recall?

## 2019-02-18 NOTE — Telephone Encounter (Signed)
All questions answered.  She will call back for any additional questions or concerns.   

## 2019-02-18 NOTE — Telephone Encounter (Signed)
Correct, no recall was recommended due to age.

## 2019-03-02 DIAGNOSIS — M9902 Segmental and somatic dysfunction of thoracic region: Secondary | ICD-10-CM | POA: Diagnosis not present

## 2019-03-02 DIAGNOSIS — M5414 Radiculopathy, thoracic region: Secondary | ICD-10-CM | POA: Diagnosis not present

## 2019-03-02 DIAGNOSIS — M9905 Segmental and somatic dysfunction of pelvic region: Secondary | ICD-10-CM | POA: Diagnosis not present

## 2019-03-02 DIAGNOSIS — M9903 Segmental and somatic dysfunction of lumbar region: Secondary | ICD-10-CM | POA: Diagnosis not present

## 2019-03-02 DIAGNOSIS — S338XXA Sprain of other parts of lumbar spine and pelvis, initial encounter: Secondary | ICD-10-CM | POA: Diagnosis not present

## 2019-03-02 DIAGNOSIS — M5386 Other specified dorsopathies, lumbar region: Secondary | ICD-10-CM | POA: Diagnosis not present

## 2019-03-09 DIAGNOSIS — M5414 Radiculopathy, thoracic region: Secondary | ICD-10-CM | POA: Diagnosis not present

## 2019-03-09 DIAGNOSIS — S338XXA Sprain of other parts of lumbar spine and pelvis, initial encounter: Secondary | ICD-10-CM | POA: Diagnosis not present

## 2019-03-09 DIAGNOSIS — M9903 Segmental and somatic dysfunction of lumbar region: Secondary | ICD-10-CM | POA: Diagnosis not present

## 2019-03-09 DIAGNOSIS — M9902 Segmental and somatic dysfunction of thoracic region: Secondary | ICD-10-CM | POA: Diagnosis not present

## 2019-03-09 DIAGNOSIS — M5386 Other specified dorsopathies, lumbar region: Secondary | ICD-10-CM | POA: Diagnosis not present

## 2019-03-09 DIAGNOSIS — M9905 Segmental and somatic dysfunction of pelvic region: Secondary | ICD-10-CM | POA: Diagnosis not present

## 2019-03-23 DIAGNOSIS — M9905 Segmental and somatic dysfunction of pelvic region: Secondary | ICD-10-CM | POA: Diagnosis not present

## 2019-03-23 DIAGNOSIS — M5414 Radiculopathy, thoracic region: Secondary | ICD-10-CM | POA: Diagnosis not present

## 2019-03-23 DIAGNOSIS — M9903 Segmental and somatic dysfunction of lumbar region: Secondary | ICD-10-CM | POA: Diagnosis not present

## 2019-03-23 DIAGNOSIS — S338XXA Sprain of other parts of lumbar spine and pelvis, initial encounter: Secondary | ICD-10-CM | POA: Diagnosis not present

## 2019-03-23 DIAGNOSIS — M9902 Segmental and somatic dysfunction of thoracic region: Secondary | ICD-10-CM | POA: Diagnosis not present

## 2019-03-23 DIAGNOSIS — M5386 Other specified dorsopathies, lumbar region: Secondary | ICD-10-CM | POA: Diagnosis not present

## 2019-04-13 DIAGNOSIS — M9905 Segmental and somatic dysfunction of pelvic region: Secondary | ICD-10-CM | POA: Diagnosis not present

## 2019-04-13 DIAGNOSIS — M9902 Segmental and somatic dysfunction of thoracic region: Secondary | ICD-10-CM | POA: Diagnosis not present

## 2019-04-13 DIAGNOSIS — M5386 Other specified dorsopathies, lumbar region: Secondary | ICD-10-CM | POA: Diagnosis not present

## 2019-04-13 DIAGNOSIS — S338XXA Sprain of other parts of lumbar spine and pelvis, initial encounter: Secondary | ICD-10-CM | POA: Diagnosis not present

## 2019-04-13 DIAGNOSIS — M9903 Segmental and somatic dysfunction of lumbar region: Secondary | ICD-10-CM | POA: Diagnosis not present

## 2019-04-13 DIAGNOSIS — M5414 Radiculopathy, thoracic region: Secondary | ICD-10-CM | POA: Diagnosis not present

## 2019-04-27 DIAGNOSIS — M9903 Segmental and somatic dysfunction of lumbar region: Secondary | ICD-10-CM | POA: Diagnosis not present

## 2019-04-27 DIAGNOSIS — S338XXA Sprain of other parts of lumbar spine and pelvis, initial encounter: Secondary | ICD-10-CM | POA: Diagnosis not present

## 2019-04-27 DIAGNOSIS — M5414 Radiculopathy, thoracic region: Secondary | ICD-10-CM | POA: Diagnosis not present

## 2019-04-27 DIAGNOSIS — M9905 Segmental and somatic dysfunction of pelvic region: Secondary | ICD-10-CM | POA: Diagnosis not present

## 2019-04-27 DIAGNOSIS — M5386 Other specified dorsopathies, lumbar region: Secondary | ICD-10-CM | POA: Diagnosis not present

## 2019-04-27 DIAGNOSIS — M9902 Segmental and somatic dysfunction of thoracic region: Secondary | ICD-10-CM | POA: Diagnosis not present

## 2019-05-16 ENCOUNTER — Ambulatory Visit: Payer: Medicare HMO | Attending: Internal Medicine

## 2019-05-16 DIAGNOSIS — Z23 Encounter for immunization: Secondary | ICD-10-CM | POA: Insufficient documentation

## 2019-05-16 NOTE — Progress Notes (Signed)
   Covid-19 Vaccination Clinic  Name:  Darrien Dutter    MRN: [id number] DOB: 10-01-1945  05/16/2019  Ms. Rollings was observed post Covid-19 immunization for 15 minutes without incidence. She was provided with Vaccine Information Sheet and instruction to access the V-Safe system.   Ms. Eriksson was instructed to call 911 with any severe reactions post vaccine: Marland Kitchen Difficulty breathing  . Swelling of your face and throat  . A fast heartbeat  . A bad rash all over your body  . Dizziness and weakness    Immunizations Administered    Name Date Dose VIS Date Route   Pfizer COVID-19 Vaccine 05/16/2019 10:58 AM 0.3 mL 04/02/2019 Intramuscular   Manufacturer: [place]   Lot: [id number]   [place]: [id number]

## 2019-05-18 DIAGNOSIS — M9905 Segmental and somatic dysfunction of pelvic region: Secondary | ICD-10-CM | POA: Diagnosis not present

## 2019-05-18 DIAGNOSIS — M9902 Segmental and somatic dysfunction of thoracic region: Secondary | ICD-10-CM | POA: Diagnosis not present

## 2019-05-18 DIAGNOSIS — M5414 Radiculopathy, thoracic region: Secondary | ICD-10-CM | POA: Diagnosis not present

## 2019-05-18 DIAGNOSIS — S338XXA Sprain of other parts of lumbar spine and pelvis, initial encounter: Secondary | ICD-10-CM | POA: Diagnosis not present

## 2019-05-18 DIAGNOSIS — M5386 Other specified dorsopathies, lumbar region: Secondary | ICD-10-CM | POA: Diagnosis not present

## 2019-05-18 DIAGNOSIS — M9903 Segmental and somatic dysfunction of lumbar region: Secondary | ICD-10-CM | POA: Diagnosis not present

## 2019-05-20 ENCOUNTER — Ambulatory Visit: Payer: Medicare HMO

## 2019-06-02 DIAGNOSIS — M5386 Other specified dorsopathies, lumbar region: Secondary | ICD-10-CM | POA: Diagnosis not present

## 2019-06-02 DIAGNOSIS — M9905 Segmental and somatic dysfunction of pelvic region: Secondary | ICD-10-CM | POA: Diagnosis not present

## 2019-06-02 DIAGNOSIS — M5414 Radiculopathy, thoracic region: Secondary | ICD-10-CM | POA: Diagnosis not present

## 2019-06-02 DIAGNOSIS — M9902 Segmental and somatic dysfunction of thoracic region: Secondary | ICD-10-CM | POA: Diagnosis not present

## 2019-06-02 DIAGNOSIS — M9903 Segmental and somatic dysfunction of lumbar region: Secondary | ICD-10-CM | POA: Diagnosis not present

## 2019-06-02 DIAGNOSIS — S338XXA Sprain of other parts of lumbar spine and pelvis, initial encounter: Secondary | ICD-10-CM | POA: Diagnosis not present

## 2019-06-07 ENCOUNTER — Ambulatory Visit: Payer: Medicare HMO | Attending: Internal Medicine

## 2019-06-07 DIAGNOSIS — Z23 Encounter for immunization: Secondary | ICD-10-CM

## 2019-06-07 NOTE — Progress Notes (Signed)
   Covid-19 Vaccination Clinic  Name:  Margaret Craig    MRN: [id number] DOB: 1945-11-26  06/07/2019  [name] was observed post Covid-19 immunization for 15 minutes without incidence. She was provided with Vaccine Information Sheet and instruction to access the V-Safe system.   [name] was instructed to call 911 with any severe reactions post vaccine: Marland Kitchen Difficulty breathing  . Swelling of your face and throat  . A fast heartbeat  . A bad rash all over your body  . Dizziness and weakness    Immunizations Administered    Name Date Dose VIS Date Route   Pfizer COVID-19 Vaccine 06/07/2019  9:19 AM 0.3 mL 04/02/2019 Intramuscular   Manufacturer: [place]   Lot: [id number]   [place]: [id number]

## 2019-06-10 ENCOUNTER — Ambulatory Visit: Payer: Medicare HMO

## 2019-06-12 ENCOUNTER — Other Ambulatory Visit: Payer: Self-pay | Admitting: Internal Medicine

## 2019-06-14 DIAGNOSIS — R69 Illness, unspecified: Secondary | ICD-10-CM | POA: Diagnosis not present

## 2019-06-16 DIAGNOSIS — M9902 Segmental and somatic dysfunction of thoracic region: Secondary | ICD-10-CM | POA: Diagnosis not present

## 2019-06-16 DIAGNOSIS — M9903 Segmental and somatic dysfunction of lumbar region: Secondary | ICD-10-CM | POA: Diagnosis not present

## 2019-06-16 DIAGNOSIS — S338XXA Sprain of other parts of lumbar spine and pelvis, initial encounter: Secondary | ICD-10-CM | POA: Diagnosis not present

## 2019-06-16 DIAGNOSIS — M5386 Other specified dorsopathies, lumbar region: Secondary | ICD-10-CM | POA: Diagnosis not present

## 2019-06-16 DIAGNOSIS — M5414 Radiculopathy, thoracic region: Secondary | ICD-10-CM | POA: Diagnosis not present

## 2019-06-16 DIAGNOSIS — M9905 Segmental and somatic dysfunction of pelvic region: Secondary | ICD-10-CM | POA: Diagnosis not present

## 2019-06-30 DIAGNOSIS — M9903 Segmental and somatic dysfunction of lumbar region: Secondary | ICD-10-CM | POA: Diagnosis not present

## 2019-06-30 DIAGNOSIS — S338XXA Sprain of other parts of lumbar spine and pelvis, initial encounter: Secondary | ICD-10-CM | POA: Diagnosis not present

## 2019-06-30 DIAGNOSIS — M5386 Other specified dorsopathies, lumbar region: Secondary | ICD-10-CM | POA: Diagnosis not present

## 2019-06-30 DIAGNOSIS — M9902 Segmental and somatic dysfunction of thoracic region: Secondary | ICD-10-CM | POA: Diagnosis not present

## 2019-06-30 DIAGNOSIS — M9905 Segmental and somatic dysfunction of pelvic region: Secondary | ICD-10-CM | POA: Diagnosis not present

## 2019-06-30 DIAGNOSIS — M5414 Radiculopathy, thoracic region: Secondary | ICD-10-CM | POA: Diagnosis not present

## 2019-07-21 DIAGNOSIS — S338XXA Sprain of other parts of lumbar spine and pelvis, initial encounter: Secondary | ICD-10-CM | POA: Diagnosis not present

## 2019-07-21 DIAGNOSIS — M9905 Segmental and somatic dysfunction of pelvic region: Secondary | ICD-10-CM | POA: Diagnosis not present

## 2019-07-21 DIAGNOSIS — M9902 Segmental and somatic dysfunction of thoracic region: Secondary | ICD-10-CM | POA: Diagnosis not present

## 2019-07-21 DIAGNOSIS — M5386 Other specified dorsopathies, lumbar region: Secondary | ICD-10-CM | POA: Diagnosis not present

## 2019-07-21 DIAGNOSIS — M5414 Radiculopathy, thoracic region: Secondary | ICD-10-CM | POA: Diagnosis not present

## 2019-07-21 DIAGNOSIS — M9903 Segmental and somatic dysfunction of lumbar region: Secondary | ICD-10-CM | POA: Diagnosis not present

## 2019-07-27 ENCOUNTER — Other Ambulatory Visit: Payer: Self-pay

## 2019-07-27 ENCOUNTER — Encounter: Payer: Self-pay | Admitting: Internal Medicine

## 2019-07-27 ENCOUNTER — Ambulatory Visit (INDEPENDENT_AMBULATORY_CARE_PROVIDER_SITE_OTHER): Payer: Medicare HMO | Admitting: Internal Medicine

## 2019-07-27 VITALS — BP 106/68 | HR 66 | Temp 98.3°F | Resp 16 | Ht 64.0 in | Wt 110.0 lb

## 2019-07-27 DIAGNOSIS — N3000 Acute cystitis without hematuria: Secondary | ICD-10-CM

## 2019-07-27 DIAGNOSIS — R350 Frequency of micturition: Secondary | ICD-10-CM | POA: Diagnosis not present

## 2019-07-27 DIAGNOSIS — R300 Dysuria: Secondary | ICD-10-CM | POA: Diagnosis not present

## 2019-07-27 DIAGNOSIS — N390 Urinary tract infection, site not specified: Secondary | ICD-10-CM | POA: Insufficient documentation

## 2019-07-27 LAB — POCT URINALYSIS DIPSTICK
Bilirubin, UA: NEGATIVE
Glucose, UA: NEGATIVE
Ketones, UA: NEGATIVE
Nitrite, UA: NEGATIVE
Protein, UA: POSITIVE — AB
Spec Grav, UA: 1.015 (ref 1.010–1.025)
Urobilinogen, UA: NEGATIVE E.U./dL — AB
pH, UA: 7 (ref 5.0–8.0)

## 2019-07-27 MED ORDER — CEPHALEXIN 500 MG PO CAPS: 500.0000 mg | ORAL_CAPSULE | Freq: Two times a day (BID) | ORAL | 0 refills | Status: DC

## 2019-07-27 NOTE — Patient Instructions (Signed)
Take the antibiotic as prescribed.  Take tylenol if needed.     Increase your water intake.   Call if no improvement     Urinary Tract Infection, Adult A urinary tract infection (UTI) is an infection of any part of the urinary tract, which includes the kidneys, ureters, bladder, and urethra. These organs make, store, and get rid of urine in the body. UTI can be a bladder infection (cystitis) or kidney infection (pyelonephritis). What are the causes? This infection may be caused by fungi, viruses, or bacteria. Bacteria are the most common cause of UTIs. This condition can also be caused by repeated incomplete emptying of the bladder during urination. What increases the risk? This condition is more likely to develop if:  You ignore your need to urinate or hold urine for long periods of time.  You do not empty your bladder completely during urination.  You wipe back to front after urinating or having a bowel movement, if you are female.  You are uncircumcised, if you are female.  You are constipated.  You have a urinary catheter that stays in place (indwelling).  You have a weak defense (immune) system.  You have a medical condition that affects your bowels, kidneys, or bladder.  You have diabetes.  You take antibiotic medicines frequently or for long periods of time, and the antibiotics no longer work well against certain types of infections (antibiotic resistance).  You take medicines that irritate your urinary tract.  You are exposed to chemicals that irritate your urinary tract.  You are female.  What are the signs or symptoms? Symptoms of this condition include:  Fever.  Frequent urination or passing small amounts of urine frequently.  Needing to urinate urgently.  Pain or burning with urination.  Urine that smells bad or unusual.  Cloudy urine.  Pain in the lower abdomen or back.  Trouble urinating.  Blood in the urine.  Vomiting or being less hungry than  normal.  Diarrhea or abdominal pain.  Vaginal discharge, if you are female.  How is this diagnosed? This condition is diagnosed with a medical history and physical exam. You will also need to provide a urine sample to test your urine. Other tests may be done, including:  Blood tests.  Sexually transmitted disease (STD) testing.  If you have had more than one UTI, a cystoscopy or imaging studies may be done to determine the cause of the infections. How is this treated? Treatment for this condition often includes a combination of two or more of the following:  Antibiotic medicine.  Other medicines to treat less common causes of UTI.  Over-the-counter medicines to treat pain.  Drinking enough water to stay hydrated.  Follow these instructions at home:  Take over-the-counter and prescription medicines only as told by your health care provider.  If you were prescribed an antibiotic, take it as told by your health care provider. Do not stop taking the antibiotic even if you start to feel better.  Avoid alcohol, caffeine, tea, and carbonated beverages. They can irritate your bladder.  Drink enough fluid to keep your urine clear or pale yellow.  Keep all follow-up visits as told by your health care provider. This is important.  Make sure to: ? Empty your bladder often and completely. Do not hold urine for long periods of time. ? Empty your bladder before and after sex. ? Wipe from front to back after a bowel movement if you are female. Use each tissue one time when you   wipe. Contact a health care provider if:  You have back pain.  You have a fever.  You feel nauseous or vomit.  Your symptoms do not get better after 3 days.  Your symptoms go away and then return. Get help right away if:  You have severe back pain or lower abdominal pain.  You are vomiting and cannot keep down any medicines or water. This information is not intended to replace advice given to you by  your health care provider. Make sure you discuss any questions you have with your health care provider. Document Released: 01/16/2005 Document Revised: 09/20/2015 Document Reviewed: 02/27/2015 Elsevier Interactive Patient Education  2018 Elsevier Inc.   

## 2019-07-27 NOTE — Progress Notes (Signed)
Subjective:    Patient ID: Margaret Craig, female    DOB: June 14, 1945, 74 y.o.   MRN: [id number]  HPI The patient is here for an acute visit.   ? UTI:  Her symptoms started  3 days ago.  She states dysuria, urinary frequency, urinary urgency  She denies hematuria, abdominal pain, back pain, nausea, fever.  She is drink a lot of water and it has not helped.  Her last urinary tract infection was years ago.   Medications and allergies reviewed with patient and updated if appropriate.  Patient Active Problem List   Diagnosis Date Noted  . Chronic midline low back pain without sciatica 10/07/2018  . Hypertension 08/18/2018  . Low TSH level 07/04/2017  . Osteopenia 06/29/2017    Current Outpatient Medications on File Prior to Visit  Medication Sig Dispense Refill  . BIOTIN PO Take by mouth.    . cholecalciferol (VITAMIN D3) 25 MCG (1000 UNIT) tablet Take 1,000 Units by mouth daily.    Marland Kitchen lisinopril (ZESTRIL) 10 MG tablet TAKE ONE TABLET BY MOUTH DAILY 90 tablet 1  . Multiple Vitamins-Minerals (MULTIVITAMIN WITH MINERALS) tablet Take 1 tablet by mouth daily.    . Multiple Vitamins-Minerals (ZINC PO) Take by mouth.     No current facility-administered medications on file prior to visit.    Past Medical History:  Diagnosis Date  . Allergy   . Arthritis   . Fainting spell   . Hypertension   . Thyroid disease   . UTI (urinary tract infection)     Past Surgical History:  Procedure Laterality Date  . COLONOSCOPY    . SINUS IRRIGATION    . TONSILLECTOMY      Social History   Socioeconomic History  . Marital status: Married    Spouse name: Not on file  . Number of children: Not on file  . Years of education: Not on file  . Highest education level: Not on file  Occupational History  . Not on file  Tobacco Use  . Smoking status: Never Smoker  . Smokeless tobacco: Never Used  Substance and Sexual Activity  . Alcohol use: Yes    Alcohol/week: 6.0 standard  drinks    Types: 6 Standard drinks or equivalent per week  . Drug use: Never  . Sexual activity: Not on file  Other Topics Concern  . Not on file  Social History Narrative  . Not on file   Social Determinants of Health   Financial Resource Strain:   . Difficulty of Paying Living Expenses:   Food Insecurity:   . Worried About Charity fundraiser in the Last Year:   . Arboriculturist in the Last Year:   Transportation Needs:   . Film/video editor (Medical):   Marland Kitchen Lack of Transportation (Non-Medical):   Physical Activity:   . Days of Exercise per Week:   . Minutes of Exercise per Session:   Stress:   . Feeling of Stress :   Social Connections:   . Frequency of Communication with Friends and Family:   . Frequency of Social Gatherings with Friends and Family:   . Attends Religious Services:   . Active Member of Clubs or Organizations:   . Attends Archivist Meetings:   Marland Kitchen Marital Status:     Family History  Problem Relation Age of Onset  . Mental illness Mother   . Colon cancer Mother 45  . Alcohol abuse Son   .  Mental illness Son   . Cancer Maternal Aunt        breast  . Heart disease Maternal Aunt   . Colon cancer Maternal Aunt        diagnosed in her 30s  . Heart disease Paternal Aunt   . Alcohol abuse Paternal Uncle   . Cancer Maternal Grandmother        breast  . Colon cancer Maternal Grandmother   . Heart disease Maternal Grandfather   . Esophageal cancer Neg Hx   . Stomach cancer Neg Hx   . Rectal cancer Neg Hx     Review of Systems Per HPI    Objective:   Vitals:   07/27/19 1514  BP: 106/68  Pulse: 66  Resp: 16  Temp: 98.3 F (36.8 C)  SpO2: 99%   BP Readings from Last 3 Encounters:  07/27/19 106/68  02/02/19 122/77  10/07/18 116/70   Wt Readings from Last 3 Encounters:  07/27/19 110 lb (49.9 kg)  02/02/19 108 lb 6.4 oz (49.2 kg)  01/19/19 108 lb 6.4 oz (49.2 kg)   Body mass index is 18.88 kg/m.   Physical  Exam Constitutional:      General: She is not in acute distress.    Appearance: Normal appearance. She is not ill-appearing.  HENT:     Head: Normocephalic and atraumatic.  Abdominal:     General: There is no distension.     Palpations: Abdomen is soft.     Tenderness: There is no abdominal tenderness. There is no right CVA tenderness, left CVA tenderness, guarding or rebound.  Skin:    General: Skin is warm and dry.  Neurological:     Mental Status: She is alert.            Assessment & Plan:    See Problem List for Assessment and Plan of chronic medical problems.    This visit occurred during the SARS-CoV-2 public health emergency.  Safety protocols were in place, including screening questions prior to the visit, additional usage of staff PPE, and extensive cleaning of exam room while observing appropriate contact time as indicated for disinfecting solutions.

## 2019-07-27 NOTE — Assessment & Plan Note (Signed)
Urine dip consistent with UTI Will send urine for culture Take the Keflex as prescribed.   Take tylenol if needed.   Increase your water intake.  Call if no improvement

## 2019-07-29 ENCOUNTER — Encounter: Payer: Self-pay | Admitting: Internal Medicine

## 2019-07-29 LAB — URINE CULTURE

## 2019-08-03 DIAGNOSIS — D2262 Melanocytic nevi of left upper limb, including shoulder: Secondary | ICD-10-CM | POA: Diagnosis not present

## 2019-08-03 DIAGNOSIS — L821 Other seborrheic keratosis: Secondary | ICD-10-CM | POA: Diagnosis not present

## 2019-08-03 DIAGNOSIS — D2261 Melanocytic nevi of right upper limb, including shoulder: Secondary | ICD-10-CM | POA: Diagnosis not present

## 2019-08-03 DIAGNOSIS — D485 Neoplasm of uncertain behavior of skin: Secondary | ICD-10-CM | POA: Diagnosis not present

## 2019-08-03 DIAGNOSIS — L723 Sebaceous cyst: Secondary | ICD-10-CM | POA: Diagnosis not present

## 2019-08-03 DIAGNOSIS — D2272 Melanocytic nevi of left lower limb, including hip: Secondary | ICD-10-CM | POA: Diagnosis not present

## 2019-08-03 DIAGNOSIS — D225 Melanocytic nevi of trunk: Secondary | ICD-10-CM | POA: Diagnosis not present

## 2019-08-03 DIAGNOSIS — D3617 Benign neoplasm of peripheral nerves and autonomic nervous system of trunk, unspecified: Secondary | ICD-10-CM | POA: Diagnosis not present

## 2019-08-03 DIAGNOSIS — L814 Other melanin hyperpigmentation: Secondary | ICD-10-CM | POA: Diagnosis not present

## 2019-08-03 DIAGNOSIS — D1801 Hemangioma of skin and subcutaneous tissue: Secondary | ICD-10-CM | POA: Diagnosis not present

## 2019-08-03 DIAGNOSIS — L570 Actinic keratosis: Secondary | ICD-10-CM | POA: Diagnosis not present

## 2019-08-03 DIAGNOSIS — L818 Other specified disorders of pigmentation: Secondary | ICD-10-CM | POA: Diagnosis not present

## 2019-08-04 DIAGNOSIS — Z01818 Encounter for other preprocedural examination: Secondary | ICD-10-CM | POA: Diagnosis not present

## 2019-08-04 DIAGNOSIS — H25812 Combined forms of age-related cataract, left eye: Secondary | ICD-10-CM | POA: Diagnosis not present

## 2019-08-04 DIAGNOSIS — H52222 Regular astigmatism, left eye: Secondary | ICD-10-CM | POA: Diagnosis not present

## 2019-08-11 DIAGNOSIS — M9902 Segmental and somatic dysfunction of thoracic region: Secondary | ICD-10-CM | POA: Diagnosis not present

## 2019-08-11 DIAGNOSIS — M9903 Segmental and somatic dysfunction of lumbar region: Secondary | ICD-10-CM | POA: Diagnosis not present

## 2019-08-11 DIAGNOSIS — M5414 Radiculopathy, thoracic region: Secondary | ICD-10-CM | POA: Diagnosis not present

## 2019-08-11 DIAGNOSIS — M9905 Segmental and somatic dysfunction of pelvic region: Secondary | ICD-10-CM | POA: Diagnosis not present

## 2019-08-11 DIAGNOSIS — M5386 Other specified dorsopathies, lumbar region: Secondary | ICD-10-CM | POA: Diagnosis not present

## 2019-08-11 DIAGNOSIS — S338XXA Sprain of other parts of lumbar spine and pelvis, initial encounter: Secondary | ICD-10-CM | POA: Diagnosis not present

## 2019-08-23 DIAGNOSIS — H25812 Combined forms of age-related cataract, left eye: Secondary | ICD-10-CM | POA: Diagnosis not present

## 2019-08-23 DIAGNOSIS — H2512 Age-related nuclear cataract, left eye: Secondary | ICD-10-CM | POA: Diagnosis not present

## 2019-09-01 DIAGNOSIS — M9903 Segmental and somatic dysfunction of lumbar region: Secondary | ICD-10-CM | POA: Diagnosis not present

## 2019-09-01 DIAGNOSIS — M9902 Segmental and somatic dysfunction of thoracic region: Secondary | ICD-10-CM | POA: Diagnosis not present

## 2019-09-01 DIAGNOSIS — M5386 Other specified dorsopathies, lumbar region: Secondary | ICD-10-CM | POA: Diagnosis not present

## 2019-09-01 DIAGNOSIS — M9905 Segmental and somatic dysfunction of pelvic region: Secondary | ICD-10-CM | POA: Diagnosis not present

## 2019-09-01 DIAGNOSIS — S338XXA Sprain of other parts of lumbar spine and pelvis, initial encounter: Secondary | ICD-10-CM | POA: Diagnosis not present

## 2019-09-01 DIAGNOSIS — M5414 Radiculopathy, thoracic region: Secondary | ICD-10-CM | POA: Diagnosis not present

## 2019-09-14 ENCOUNTER — Other Ambulatory Visit: Payer: Self-pay | Admitting: Internal Medicine

## 2019-09-14 DIAGNOSIS — Z1231 Encounter for screening mammogram for malignant neoplasm of breast: Secondary | ICD-10-CM

## 2019-09-28 DIAGNOSIS — M9903 Segmental and somatic dysfunction of lumbar region: Secondary | ICD-10-CM | POA: Diagnosis not present

## 2019-09-28 DIAGNOSIS — M9905 Segmental and somatic dysfunction of pelvic region: Secondary | ICD-10-CM | POA: Diagnosis not present

## 2019-09-28 DIAGNOSIS — M9902 Segmental and somatic dysfunction of thoracic region: Secondary | ICD-10-CM | POA: Diagnosis not present

## 2019-09-28 DIAGNOSIS — M5386 Other specified dorsopathies, lumbar region: Secondary | ICD-10-CM | POA: Diagnosis not present

## 2019-09-28 DIAGNOSIS — S338XXA Sprain of other parts of lumbar spine and pelvis, initial encounter: Secondary | ICD-10-CM | POA: Diagnosis not present

## 2019-09-28 DIAGNOSIS — M5414 Radiculopathy, thoracic region: Secondary | ICD-10-CM | POA: Diagnosis not present

## 2019-10-04 DIAGNOSIS — H2512 Age-related nuclear cataract, left eye: Secondary | ICD-10-CM | POA: Diagnosis not present

## 2019-10-04 DIAGNOSIS — H25811 Combined forms of age-related cataract, right eye: Secondary | ICD-10-CM | POA: Diagnosis not present

## 2019-10-04 DIAGNOSIS — H2511 Age-related nuclear cataract, right eye: Secondary | ICD-10-CM | POA: Diagnosis not present

## 2019-10-08 ENCOUNTER — Encounter: Payer: Medicare HMO | Admitting: Internal Medicine

## 2019-10-11 NOTE — Progress Notes (Signed)
Subjective:    Patient ID: Margaret Craig, female    DOB: [birth year], 74 y.o.   MRN: [id number]  HPI She is here for a physical exam.   Something bite her last night on her left knee - it itches and is using benadryl cream.    She feels well and has no concerns.    Medications and allergies reviewed with patient and updated if appropriate.  Patient Active Problem List   Diagnosis Date Noted   History of colon polyps 10/12/2019   UTI (urinary tract infection) 07/27/2019   Chronic midline low back pain without sciatica 10/07/2018   Hypertension 08/18/2018   Low TSH level 07/04/2017   Osteopenia 06/29/2017    Current Outpatient Medications on File Prior to Visit  Medication Sig Dispense Refill   BIOTIN PO Take by mouth.     Calcium Carbonate-Vitamin D 600-200 MG-UNIT TABS Take by mouth.     cholecalciferol (VITAMIN D3) 25 MCG (1000 UNIT) tablet Take 1,000 Units by mouth daily.     Multiple Vitamins-Minerals (MULTIVITAMIN WITH MINERALS) tablet Take 1 tablet by mouth daily.     Multiple Vitamins-Minerals (ZINC PO) Take by mouth.     No current facility-administered medications on file prior to visit.    Past Medical History:  Diagnosis Date   Allergy    Arthritis    Fainting spell    Hypertension    Thyroid disease    UTI (urinary tract infection)     Past Surgical History:  Procedure Laterality Date   COLONOSCOPY     SINUS IRRIGATION     TONSILLECTOMY      Social History   Socioeconomic History   Marital status: Married    Spouse name: Not on file   Number of children: Not on file   Years of education: Not on file   Highest education level: Not on file  Occupational History   Not on file  Tobacco Use   Smoking status: Never Smoker   Smokeless tobacco: Never Used  Vaping Use   Vaping Use: Never used  Substance and Sexual Activity   Alcohol use: Yes    Alcohol/week: 6.0 standard drinks    Types: 6 Standard  drinks or equivalent per week   Drug use: Never   Sexual activity: Not on file  Other Topics Concern   Not on file  Social History Narrative   Not on file   Social Determinants of Health   Financial Resource Strain:    Difficulty of Paying Living Expenses:   Food Insecurity:    Worried About Charity fundraiser in the Last Year:    Arboriculturist in the Last Year:   Transportation Needs:    Film/video editor (Medical):    Lack of Transportation (Non-Medical):   Physical Activity:    Days of Exercise per Week:    Minutes of Exercise per Session:   Stress:    Feeling of Stress :   Social Connections:    Frequency of Communication with Friends and Family:    Frequency of Social Gatherings with Friends and Family:    Attends Religious Services:    Active Member of Clubs or Organizations:    Attends Music therapist:    Marital Status:     Family History  Problem Relation Age of Onset   Mental illness Mother    Colon cancer Mother 63   Alcohol abuse Son    Mental illness Son  Cancer Maternal Aunt        breast   Heart disease Maternal Aunt    Colon cancer Maternal Aunt        diagnosed in her 63s   Heart disease Paternal Aunt    Alcohol abuse Paternal Uncle    Cancer Maternal Grandmother        breast   Colon cancer Maternal Grandmother    Heart disease Maternal Grandfather    Esophageal cancer Neg Hx    Stomach cancer Neg Hx    Rectal cancer Neg Hx     Review of Systems  Constitutional: Negative for chills and fever.  Eyes: Negative for visual disturbance.  Respiratory: Negative for cough, shortness of breath and wheezing.   Cardiovascular: Negative for chest pain, palpitations and leg swelling.  Gastrointestinal: Negative for abdominal pain, blood in stool, constipation, diarrhea and nausea.       No gerd  Genitourinary: Negative for dysuria and hematuria.  Musculoskeletal: Positive for back pain (worse,  sees chiropractor, will see ortho). Negative for arthralgias.  Skin: Negative for rash.  Neurological: Negative for dizziness, light-headedness and headaches.  Psychiatric/Behavioral: Negative for dysphoric mood. The patient is not nervous/anxious.        Objective:   Vitals:   10/12/19 1058  BP: 106/78  Pulse: (!) 59  Temp: 98.1 F (36.7 C)  SpO2: 99%   Filed Weights   10/12/19 1058  Weight: 109 lb 6.4 oz (49.6 kg)   Body mass index is 18.78 kg/m.  BP Readings from Last 3 Encounters:  10/12/19 106/78  07/27/19 106/68  02/02/19 122/77    Wt Readings from Last 3 Encounters:  10/12/19 109 lb 6.4 oz (49.6 kg)  07/27/19 110 lb (49.9 kg)  02/02/19 108 lb 6.4 oz (49.2 kg)     Physical Exam Constitutional: She appears well-developed and well-nourished. No distress.  HENT:  Head: Normocephalic and atraumatic.  Right Ear: External ear normal. Normal ear canal and TM Left Ear: External ear normal.  Normal ear canal and TM Mouth/Throat: Oropharynx is clear and moist.  Eyes: Conjunctivae and EOM are normal.  Neck: Neck supple. No tracheal deviation present. No thyromegaly present.  No carotid bruit  Cardiovascular: Normal rate, regular rhythm and normal heart sounds.   No murmur heard.  No edema. Pulmonary/Chest: Effort normal and breath sounds normal. No respiratory distress. She has no wheezes. She has no rales.  Breast: deferred   Abdominal: Soft. She exhibits no distension. There is no tenderness.  Lymphadenopathy: She has no cervical adenopathy.  Skin: Skin is warm and dry. She is not diaphoretic.  Psychiatric: She has a normal mood and affect. Her behavior is normal.        Assessment & Plan:   Physical exam: Screening blood work    ordered Immunizations  tdap due, others up to date Colonoscopy  Up to date - last 01/2019 Mammogram  Up to date - next mammo scheduled Gyn    n/a Dexa  Up to date  Eye exams  Up to date  Exercise   walking Weight   Normal    Substance abuse  None Sees derm annually  See Problem List for Assessment and Plan of chronic medical problems.   This visit occurred during the SARS-CoV-2 public health emergency.  Safety protocols were in place, including screening questions prior to the visit, additional usage of staff PPE, and extensive cleaning of exam room while observing appropriate contact time as indicated for disinfecting solutions.

## 2019-10-11 NOTE — Patient Instructions (Addendum)
Blood work was ordered.    All other Health Maintenance issues reviewed.   All recommended immunizations and age-appropriate screenings are up-to-date or discussed.  No immunization administered today.   Medications reviewed and updated.  Changes include :   Stop lisinopril -- your BP < 140/90    Please followup in 1 year     Health Maintenance, Female Adopting a healthy lifestyle and getting preventive care are important in promoting health and wellness. Ask your health care provider about:  The right schedule for you to have regular tests and exams.  Things you can do on your own to prevent diseases and keep yourself healthy. What should I know about diet, weight, and exercise? Eat a healthy diet   Eat a diet that includes plenty of vegetables, fruits, low-fat dairy products, and lean protein.  Do not eat a lot of foods that are high in solid fats, added sugars, or sodium. Maintain a healthy weight Body mass index (BMI) is used to identify weight problems. It estimates body fat based on height and weight. Your health care provider can help determine your BMI and help you achieve or maintain a healthy weight. Get regular exercise Get regular exercise. This is one of the most important things you can do for your health. Most adults should:  Exercise for at least 150 minutes each week. The exercise should increase your heart rate and make you sweat (moderate-intensity exercise).  Do strengthening exercises at least twice a week. This is in addition to the moderate-intensity exercise.  Spend less time sitting. Even light physical activity can be beneficial. Watch cholesterol and blood lipids Have your blood tested for lipids and cholesterol at 75 years of age, then have this test every 5 years. Have your cholesterol levels checked more often if:  Your lipid or cholesterol levels are high.  You are older than 75 years of age.  You are at high risk for heart disease. What  should I know about cancer screening? Depending on your health history and family history, you may need to have cancer screening at various ages. This may include screening for:  Breast cancer.  Cervical cancer.  Colorectal cancer.  Skin cancer.  Lung cancer. What should I know about heart disease, diabetes, and high blood pressure? Blood pressure and heart disease  High blood pressure causes heart disease and increases the risk of stroke. This is more likely to develop in people who have high blood pressure readings, are of African descent, or are overweight.  Have your blood pressure checked: ? Every 3-5 years if you are 25-52 years of age. ? Every year if you are 20 years old or older. Diabetes Have regular diabetes screenings. This checks your fasting blood sugar level. Have the screening done:  Once every three years after age 64 if you are at a normal weight and have a low risk for diabetes.  More often and at a younger age if you are overweight or have a high risk for diabetes. What should I know about preventing infection? Hepatitis B If you have a higher risk for hepatitis B, you should be screened for this virus. Talk with your health care provider to find out if you are at risk for hepatitis B infection. Hepatitis C Testing is recommended for:  Everyone born from [birth year] through 1966.  Anyone with known risk factors for hepatitis C. Sexually transmitted infections (STIs)  Get screened for STIs, including gonorrhea and chlamydia, if: ? You are sexually active  and are younger than 75 years of age. ? You are older than 75 years of age and your health care provider tells you that you are at risk for this type of infection. ? Your sexual activity has changed since you were last screened, and you are at increased risk for chlamydia or gonorrhea. Ask your health care provider if you are at risk.  Ask your health care provider about whether you are at high risk for HIV. Your  health care provider may recommend a prescription medicine to help prevent HIV infection. If you choose to take medicine to prevent HIV, you should first get tested for HIV. You should then be tested every 3 months for as long as you are taking the medicine. Pregnancy  If you are about to stop having your period (premenopausal) and you may become pregnant, seek counseling before you get pregnant.  Take 400 to 800 micrograms (mcg) of folic acid every day if you become pregnant.  Ask for birth control (contraception) if you want to prevent pregnancy. Osteoporosis and menopause Osteoporosis is a disease in which the bones lose minerals and strength with aging. This can result in bone fractures. If you are 75 years old or older, or if you are at risk for osteoporosis and fractures, ask your health care provider if you should:  Be screened for bone loss.  Take a calcium or vitamin D supplement to lower your risk of fractures.  Be given hormone replacement therapy (HRT) to treat symptoms of menopause. Follow these instructions at home: Lifestyle  Do not use any products that contain nicotine or tobacco, such as cigarettes, e-cigarettes, and chewing tobacco. If you need help quitting, ask your health care provider.  Do not use street drugs.  Do not share needles.  Ask your health care provider for help if you need support or information about quitting drugs. Alcohol use  Do not drink alcohol if: ? Your health care provider tells you not to drink. ? You are pregnant, may be pregnant, or are planning to become pregnant.  If you drink alcohol: ? Limit how much you use to 0-1 drink a day. ? Limit intake if you are breastfeeding.  Be aware of how much alcohol is in your drink. In the [place] one drink equals one 12 oz bottle of beer (355 mL), one 5 oz glass of wine (148 mL), or one 1 oz glass of hard liquor (44 mL). General instructions  Schedule regular health, dental, and eye  exams.  Stay current with your vaccines.  Tell your health care provider if: ? You often feel depressed. ? You have ever been abused or do not feel safe at home. Summary  Adopting a healthy lifestyle and getting preventive care are important in promoting health and wellness.  Follow your health care provider's instructions about healthy diet, exercising, and getting tested or screened for diseases.  Follow your health care provider's instructions on monitoring your cholesterol and blood pressure. This information is not intended to replace advice given to you by your health care provider. Make sure you discuss any questions you have with your health care provider. Document Revised: 04/01/2018 Document Reviewed: 04/01/2018 Elsevier Patient Education  2020 [employer].

## 2019-10-12 ENCOUNTER — Ambulatory Visit (INDEPENDENT_AMBULATORY_CARE_PROVIDER_SITE_OTHER): Payer: Medicare HMO | Admitting: Internal Medicine

## 2019-10-12 ENCOUNTER — Encounter: Payer: Self-pay | Admitting: Internal Medicine

## 2019-10-12 ENCOUNTER — Other Ambulatory Visit: Payer: Self-pay

## 2019-10-12 VITALS — BP 106/78 | HR 59 | Temp 98.1°F | Ht 64.0 in | Wt 109.4 lb

## 2019-10-12 DIAGNOSIS — Z86010 Personal history of colonic polyps: Secondary | ICD-10-CM

## 2019-10-12 DIAGNOSIS — I10 Essential (primary) hypertension: Secondary | ICD-10-CM

## 2019-10-12 DIAGNOSIS — M85852 Other specified disorders of bone density and structure, left thigh: Secondary | ICD-10-CM | POA: Diagnosis not present

## 2019-10-12 DIAGNOSIS — Z0000 Encounter for general adult medical examination without abnormal findings: Secondary | ICD-10-CM

## 2019-10-12 LAB — COMPREHENSIVE METABOLIC PANEL
ALT: 15 U/L (ref 0–35)
AST: 17 U/L (ref 0–37)
Albumin: 4.5 g/dL (ref 3.5–5.2)
Alkaline Phosphatase: 53 U/L (ref 39–117)
BUN: 18 mg/dL (ref 6–23)
CO2: 29 mEq/L (ref 19–32)
Calcium: 9.5 mg/dL (ref 8.4–10.5)
Chloride: 103 mEq/L (ref 96–112)
Creatinine, Ser: 0.79 mg/dL (ref 0.40–1.20)
GFR: 70.99 mL/min (ref >60.00)
Glucose, Bld: 90 mg/dL (ref 70–99)
Potassium: 4.2 mEq/L (ref 3.5–5.1)
Sodium: 139 mEq/L (ref 135–145)
Total Bilirubin: 0.5 mg/dL (ref 0.2–1.2)
Total Protein: 7.1 g/dL (ref 6.0–8.3)

## 2019-10-12 LAB — LIPID PANEL
Cholesterol: 224 mg/dL — ABNORMAL HIGH (ref 0–200)
HDL: 90.3 mg/dL (ref >39.00)
LDL Cholesterol: 118 mg/dL — ABNORMAL HIGH (ref 0–99)
NonHDL: 133.63
Total CHOL/HDL Ratio: 2
Triglycerides: 80 mg/dL (ref 0.0–149.0)
VLDL: 16 mg/dL (ref 0.0–40.0)

## 2019-10-12 LAB — CBC WITH DIFFERENTIAL/PLATELET
Basophils Absolute: 0.1 10*3/uL (ref 0.0–0.1)
Basophils Relative: 0.9 % (ref 0.0–3.0)
Eosinophils Absolute: 0 10*3/uL (ref 0.0–0.7)
Eosinophils Relative: 0.8 % (ref 0.0–5.0)
HCT: 39.9 % (ref 36.0–46.0)
Hemoglobin: 13.5 g/dL (ref 12.0–15.0)
Lymphocytes Relative: 22 % (ref 12.0–46.0)
Lymphs Abs: 1.2 10*3/uL (ref 0.7–4.0)
MCHC: 33.8 g/dL (ref 30.0–36.0)
MCV: 99.1 fl (ref 78.0–100.0)
Monocytes Absolute: 0.4 10*3/uL (ref 0.1–1.0)
Monocytes Relative: 7.6 % (ref 3.0–12.0)
Neutro Abs: 3.9 10*3/uL (ref 1.4–7.7)
Neutrophils Relative %: 68.7 % (ref 43.0–77.0)
Platelets: 256 10*3/uL (ref 150.0–400.0)
RBC: 4.03 Mil/uL (ref 3.87–5.11)
RDW: 13 % (ref 11.5–15.5)
WBC: 5.6 10*3/uL (ref 4.0–10.5)

## 2019-10-12 LAB — VITAMIN D 25 HYDROXY (VIT D DEFICIENCY, FRACTURES): VITD: 52.18 ng/mL (ref 30.00–100.00)

## 2019-10-12 NOTE — Assessment & Plan Note (Signed)
H/o of precancerous polyps and mom died of colon cancer Last colonoscopy 01/2019 Recommend repeat colonoscopy in 5 years

## 2019-10-12 NOTE — Assessment & Plan Note (Signed)
Chronic BP well controlled, ? Over controlled Stop lisinopril 5 mg daily - monitor at home BP  < 140/90 cmp

## 2019-10-12 NOTE — Assessment & Plan Note (Signed)
Chronic dexa up to date Taking calcium and vitamin d daily Exercising regularly

## 2019-10-14 ENCOUNTER — Telehealth: Payer: Self-pay | Admitting: Emergency Medicine

## 2019-10-14 DIAGNOSIS — L570 Actinic keratosis: Secondary | ICD-10-CM | POA: Diagnosis not present

## 2019-10-14 DIAGNOSIS — L821 Other seborrheic keratosis: Secondary | ICD-10-CM | POA: Diagnosis not present

## 2019-10-14 MED ORDER — LISINOPRIL 5 MG PO TABS: 5.0000 mg | ORAL_TABLET | Freq: Every day | ORAL | 3 refills | Status: DC

## 2019-10-14 NOTE — Telephone Encounter (Signed)
Yes continue lisinopril

## 2019-10-14 NOTE — Telephone Encounter (Signed)
Spoke with patient today and also went over her lab results.

## 2019-10-14 NOTE — Telephone Encounter (Signed)
Pt called and stated Dr Quay Burow took her off her lisinopril Tuesday at her appt. Yesterday afternoon her blood pressure was high at 157/107. She did take her medicine this morning since it was high yesterday. Today it is 125/85. She wants to know if she should continue it back on medication for good. Please give her a call thanks.

## 2019-10-20 DIAGNOSIS — M5414 Radiculopathy, thoracic region: Secondary | ICD-10-CM | POA: Diagnosis not present

## 2019-10-20 DIAGNOSIS — M9902 Segmental and somatic dysfunction of thoracic region: Secondary | ICD-10-CM | POA: Diagnosis not present

## 2019-10-20 DIAGNOSIS — M9905 Segmental and somatic dysfunction of pelvic region: Secondary | ICD-10-CM | POA: Diagnosis not present

## 2019-10-20 DIAGNOSIS — M5386 Other specified dorsopathies, lumbar region: Secondary | ICD-10-CM | POA: Diagnosis not present

## 2019-10-20 DIAGNOSIS — S338XXA Sprain of other parts of lumbar spine and pelvis, initial encounter: Secondary | ICD-10-CM | POA: Diagnosis not present

## 2019-10-20 DIAGNOSIS — M9903 Segmental and somatic dysfunction of lumbar region: Secondary | ICD-10-CM | POA: Diagnosis not present

## 2019-10-28 ENCOUNTER — Ambulatory Visit
Admission: RE | Admit: 2019-10-28 | Discharge: 2019-10-28 | Disposition: A | Discharge status: Home / Self Care | Payer: Medicare HMO | Source: Ambulatory Visit | Attending: Internal Medicine | Admitting: Internal Medicine

## 2019-10-28 ENCOUNTER — Other Ambulatory Visit: Payer: Self-pay

## 2019-10-28 DIAGNOSIS — Z1231 Encounter for screening mammogram for malignant neoplasm of breast: Secondary | ICD-10-CM

## 2019-11-04 DIAGNOSIS — M545 Low back pain: Secondary | ICD-10-CM | POA: Diagnosis not present

## 2019-11-05 DIAGNOSIS — M545 Low back pain: Secondary | ICD-10-CM | POA: Diagnosis not present

## 2019-11-05 DIAGNOSIS — M25552 Pain in left hip: Secondary | ICD-10-CM | POA: Diagnosis not present

## 2019-11-05 DIAGNOSIS — R269 Unspecified abnormalities of gait and mobility: Secondary | ICD-10-CM | POA: Diagnosis not present

## 2019-11-05 DIAGNOSIS — R531 Weakness: Secondary | ICD-10-CM | POA: Diagnosis not present

## 2019-11-09 DIAGNOSIS — M545 Low back pain: Secondary | ICD-10-CM | POA: Diagnosis not present

## 2019-11-09 DIAGNOSIS — R269 Unspecified abnormalities of gait and mobility: Secondary | ICD-10-CM | POA: Diagnosis not present

## 2019-11-09 DIAGNOSIS — M25552 Pain in left hip: Secondary | ICD-10-CM | POA: Diagnosis not present

## 2019-11-09 DIAGNOSIS — R531 Weakness: Secondary | ICD-10-CM | POA: Diagnosis not present

## 2019-11-12 DIAGNOSIS — M545 Low back pain: Secondary | ICD-10-CM | POA: Diagnosis not present

## 2019-11-12 DIAGNOSIS — M25552 Pain in left hip: Secondary | ICD-10-CM | POA: Diagnosis not present

## 2019-11-12 DIAGNOSIS — R531 Weakness: Secondary | ICD-10-CM | POA: Diagnosis not present

## 2019-11-12 DIAGNOSIS — R269 Unspecified abnormalities of gait and mobility: Secondary | ICD-10-CM | POA: Diagnosis not present

## 2019-11-15 DIAGNOSIS — R269 Unspecified abnormalities of gait and mobility: Secondary | ICD-10-CM | POA: Diagnosis not present

## 2019-11-15 DIAGNOSIS — M25552 Pain in left hip: Secondary | ICD-10-CM | POA: Diagnosis not present

## 2019-11-15 DIAGNOSIS — M545 Low back pain: Secondary | ICD-10-CM | POA: Diagnosis not present

## 2019-11-15 DIAGNOSIS — R531 Weakness: Secondary | ICD-10-CM | POA: Diagnosis not present

## 2019-11-16 DIAGNOSIS — M9902 Segmental and somatic dysfunction of thoracic region: Secondary | ICD-10-CM | POA: Diagnosis not present

## 2019-11-16 DIAGNOSIS — M9905 Segmental and somatic dysfunction of pelvic region: Secondary | ICD-10-CM | POA: Diagnosis not present

## 2019-11-16 DIAGNOSIS — S338XXA Sprain of other parts of lumbar spine and pelvis, initial encounter: Secondary | ICD-10-CM | POA: Diagnosis not present

## 2019-11-16 DIAGNOSIS — M9903 Segmental and somatic dysfunction of lumbar region: Secondary | ICD-10-CM | POA: Diagnosis not present

## 2019-11-16 DIAGNOSIS — M5414 Radiculopathy, thoracic region: Secondary | ICD-10-CM | POA: Diagnosis not present

## 2019-11-16 DIAGNOSIS — M5386 Other specified dorsopathies, lumbar region: Secondary | ICD-10-CM | POA: Diagnosis not present

## 2019-11-17 DIAGNOSIS — M545 Low back pain: Secondary | ICD-10-CM | POA: Diagnosis not present

## 2019-11-17 DIAGNOSIS — R531 Weakness: Secondary | ICD-10-CM | POA: Diagnosis not present

## 2019-11-17 DIAGNOSIS — M25552 Pain in left hip: Secondary | ICD-10-CM | POA: Diagnosis not present

## 2019-11-17 DIAGNOSIS — R269 Unspecified abnormalities of gait and mobility: Secondary | ICD-10-CM | POA: Diagnosis not present

## 2019-11-23 DIAGNOSIS — M25552 Pain in left hip: Secondary | ICD-10-CM | POA: Diagnosis not present

## 2019-11-23 DIAGNOSIS — R269 Unspecified abnormalities of gait and mobility: Secondary | ICD-10-CM | POA: Diagnosis not present

## 2019-11-23 DIAGNOSIS — M545 Low back pain: Secondary | ICD-10-CM | POA: Diagnosis not present

## 2019-11-23 DIAGNOSIS — R531 Weakness: Secondary | ICD-10-CM | POA: Diagnosis not present

## 2019-11-24 DIAGNOSIS — R531 Weakness: Secondary | ICD-10-CM | POA: Diagnosis not present

## 2019-11-24 DIAGNOSIS — R269 Unspecified abnormalities of gait and mobility: Secondary | ICD-10-CM | POA: Diagnosis not present

## 2019-11-24 DIAGNOSIS — M545 Low back pain: Secondary | ICD-10-CM | POA: Diagnosis not present

## 2019-11-24 DIAGNOSIS — M25552 Pain in left hip: Secondary | ICD-10-CM | POA: Diagnosis not present

## 2019-12-01 DIAGNOSIS — R531 Weakness: Secondary | ICD-10-CM | POA: Diagnosis not present

## 2019-12-01 DIAGNOSIS — M545 Low back pain: Secondary | ICD-10-CM | POA: Diagnosis not present

## 2019-12-01 DIAGNOSIS — M25552 Pain in left hip: Secondary | ICD-10-CM | POA: Diagnosis not present

## 2019-12-01 DIAGNOSIS — R269 Unspecified abnormalities of gait and mobility: Secondary | ICD-10-CM | POA: Diagnosis not present

## 2019-12-07 DIAGNOSIS — R531 Weakness: Secondary | ICD-10-CM | POA: Diagnosis not present

## 2019-12-07 DIAGNOSIS — R269 Unspecified abnormalities of gait and mobility: Secondary | ICD-10-CM | POA: Diagnosis not present

## 2019-12-07 DIAGNOSIS — M545 Low back pain: Secondary | ICD-10-CM | POA: Diagnosis not present

## 2019-12-07 DIAGNOSIS — M25552 Pain in left hip: Secondary | ICD-10-CM | POA: Diagnosis not present

## 2019-12-08 DIAGNOSIS — M5414 Radiculopathy, thoracic region: Secondary | ICD-10-CM | POA: Diagnosis not present

## 2019-12-08 DIAGNOSIS — M9903 Segmental and somatic dysfunction of lumbar region: Secondary | ICD-10-CM | POA: Diagnosis not present

## 2019-12-08 DIAGNOSIS — S338XXA Sprain of other parts of lumbar spine and pelvis, initial encounter: Secondary | ICD-10-CM | POA: Diagnosis not present

## 2019-12-08 DIAGNOSIS — M9902 Segmental and somatic dysfunction of thoracic region: Secondary | ICD-10-CM | POA: Diagnosis not present

## 2019-12-08 DIAGNOSIS — M9905 Segmental and somatic dysfunction of pelvic region: Secondary | ICD-10-CM | POA: Diagnosis not present

## 2019-12-08 DIAGNOSIS — M5386 Other specified dorsopathies, lumbar region: Secondary | ICD-10-CM | POA: Diagnosis not present

## 2019-12-09 ENCOUNTER — Other Ambulatory Visit: Payer: Self-pay | Admitting: Internal Medicine

## 2019-12-11 ENCOUNTER — Other Ambulatory Visit: Payer: Self-pay | Admitting: Internal Medicine

## 2019-12-14 NOTE — Progress Notes (Signed)
Comment this   Subjective:    Patient ID: Margaret Craig, female    DOB: 06/12/1945, 74 y.o.   MRN: [id number]  HPI The patient is here for an acute visit.   Yellow jacket sting: 2 days ago she was stung by 2 yellow jackets 1 on her right hand and 1 on the distal forearm.  She typically swells significantly after getting stung by an ED in her right arm swelled up to her shoulder.  She took 2 Benadryl right away.  Shortly after that she started to feel like the Benadryl got stuck in her throat and were not going down.  When she was talking to a friend of hers who is a nurse she mentioned this is probably her throat swelling.  She was not able to swallow water normally.  She did call 911 and EMS came out and evaluated her.  While they were there her symptoms started to improve.  After a couple of hours she was able to swallow without difficulty.  She ate ice cream and then was able to eat without any issues.  She took Benadryl a couple of more times and her symptoms have resolved except for some swelling in the distal right arm which is mild.  Her symptoms continue to improve.  She was wondering if she should have an EpiPen.      Medications and allergies reviewed with patient and updated if appropriate.  Patient Active Problem List   Diagnosis Date Noted  . History of colon polyps 10/12/2019  . UTI (urinary tract infection) 07/27/2019  . Chronic midline low back pain without sciatica 10/07/2018  . Hypertension 08/18/2018  . Low TSH level 07/04/2017  . Osteopenia 06/29/2017    Current Outpatient Medications on File Prior to Visit  Medication Sig Dispense Refill  . BIOTIN PO Take by mouth.    . Calcium Carbonate-Vitamin D 600-200 MG-UNIT TABS Take by mouth.    . cholecalciferol (VITAMIN D3) 25 MCG (1000 UNIT) tablet Take 1,000 Units by mouth daily.    . Multiple Vitamins-Minerals (MULTIVITAMIN WITH MINERALS) tablet Take 1 tablet by mouth daily.    . Multiple Vitamins-Minerals  (ZINC PO) Take by mouth.     No current facility-administered medications on file prior to visit.    Past Medical History:  Diagnosis Date  . Allergy   . Arthritis   . Fainting spell   . Hypertension   . Thyroid disease   . UTI (urinary tract infection)     Past Surgical History:  Procedure Laterality Date  . COLONOSCOPY    . SINUS IRRIGATION    . TONSILLECTOMY      Social History   Socioeconomic History  . Marital status: Married    Spouse name: Not on file  . Number of children: Not on file  . Years of education: Not on file  . Highest education level: Not on file  Occupational History  . Not on file  Tobacco Use  . Smoking status: Never Smoker  . Smokeless tobacco: Never Used  Vaping Use  . Vaping Use: Never used  Substance and Sexual Activity  . Alcohol use: Yes    Alcohol/week: 6.0 standard drinks    Types: 6 Standard drinks or equivalent per week  . Drug use: Never  . Sexual activity: Not on file  Other Topics Concern  . Not on file  Social History Narrative  . Not on file   Social Determinants of Health   Financial Resource Strain:   .  Difficulty of Paying Living Expenses: Not on file  Food Insecurity:   . Worried About Charity fundraiser in the Last Year: Not on file  . Ran Out of Food in the Last Year: Not on file  Transportation Needs:   . Lack of Transportation (Medical): Not on file  . Lack of Transportation (Non-Medical): Not on file  Physical Activity:   . Days of Exercise per Week: Not on file  . Minutes of Exercise per Session: Not on file  Stress:   . Feeling of Stress : Not on file  Social Connections:   . Frequency of Communication with Friends and Family: Not on file  . Frequency of Social Gatherings with Friends and Family: Not on file  . Attends Religious Services: Not on file  . Active Member of Clubs or Organizations: Not on file  . Attends Archivist Meetings: Not on file  . Marital Status: Not on file     Family History  Problem Relation Age of Onset  . Mental illness Mother   . Colon cancer Mother 92  . Alcohol abuse Son   . Mental illness Son   . Cancer Maternal Aunt        breast  . Heart disease Maternal Aunt   . Colon cancer Maternal Aunt        diagnosed in her 29s  . Heart disease Paternal Aunt   . Alcohol abuse Paternal Uncle   . Cancer Maternal Grandmother        breast  . Colon cancer Maternal Grandmother   . Heart disease Maternal Grandfather   . Esophageal cancer Neg Hx   . Stomach cancer Neg Hx   . Rectal cancer Neg Hx     Review of Systems     Objective:   Vitals:   12/15/19 1529  BP: 116/72  Pulse: 69  Temp: 98.3 F (36.8 C)  SpO2: 98%   BP Readings from Last 3 Encounters:  12/15/19 116/72  10/12/19 106/78  07/27/19 106/68   Wt Readings from Last 3 Encounters:  12/15/19 110 lb (49.9 kg)  10/12/19 109 lb 6.4 oz (49.6 kg)  07/27/19 110 lb (49.9 kg)   Body mass index is 18.88 kg/m.   Physical Exam Constitutional:      General: She is not in acute distress.    Appearance: Normal appearance. She is not ill-appearing.  HENT:     Head: Normocephalic and atraumatic.  Skin:    General: Skin is warm and dry.     Comments: Mild swelling right hand and distal forearm.  Able to see to sting marks.  Continuing to improve per patient.  Neurological:     Mental Status: She is alert.            Assessment & Plan:    See Problem List for Assessment and Plan of chronic medical problems.    This visit occurred during the SARS-CoV-2 public health emergency.  Safety protocols were in place, including screening questions prior to the visit, additional usage of staff PPE, and extensive cleaning of exam room while observing appropriate contact time as indicated for disinfecting solutions.

## 2019-12-15 ENCOUNTER — Encounter: Payer: Self-pay | Admitting: Internal Medicine

## 2019-12-15 ENCOUNTER — Other Ambulatory Visit: Payer: Self-pay

## 2019-12-15 ENCOUNTER — Ambulatory Visit (INDEPENDENT_AMBULATORY_CARE_PROVIDER_SITE_OTHER): Payer: Medicare HMO | Admitting: Internal Medicine

## 2019-12-15 DIAGNOSIS — Z91030 Bee allergy status: Secondary | ICD-10-CM

## 2019-12-15 MED ORDER — EPINEPHRINE 0.3 MG/0.3ML IJ SOAJ: 0.3000 mg | INTRAMUSCULAR | 0 refills | Status: DC | PRN

## 2019-12-15 MED ORDER — LISINOPRIL 10 MG PO TABS: 10.0000 mg | ORAL_TABLET | Freq: Every day | ORAL | 3 refills | Status: DC

## 2019-12-15 NOTE — Patient Instructions (Addendum)
Take benadryl as soon as you have a sting in the future.  An epi pen was sent your pharmacy.   How to Use an Auto-Injector Pen An auto-injector pen (pre-filled automatic epinephrine injection device) is a device that is used to deliver epinephrine to the body. Epinephrine is a medicine that is given as a shot (injection). It works by relaxing the muscles in the airways and tightening the blood vessels. It is used to treat:  A life-threatening allergic reaction (anaphylaxis).  Serious breathing problems, such as severe asthma attacks, some lung problems, and other emergency conditions. An epinephrine injection can save your life. You should always carry an auto-injector pen with you if you are at risk for severe asthma attacks or anaphylaxis. You may hear other names for an auto-injector pen. They are epinephrine injection, epinephrine auto-injector pen, epinephrine pen, and automatic injection device. What are the risks? Using the auto-injector pen is safe. However, problems may arise, including:  Damage to bone or tissue. Make sure that you correctly place the needle in the muscle of your outer thigh as told by your health care provider. When should I use my auto-injector pen? Use your auto-injector pen as soon as you think you are experiencing anaphylaxis or a severe asthma attack. Anaphylaxis is very dangerous if it is not treated right away. Signs and symptoms of anaphylaxis may include:  Feeling warm in the face (flushed). This may include redness.  Itchy, red, swollen areas of skin (hives).  Swelling of the eyes, lips, face, mouth, tongue, or throat.  Difficulty breathing, speaking, or swallowing.  Noisy breathing (wheezing).  Dizziness or light-headedness.  Fainting.  Pain or cramping in the abdomen.  Vomiting.  Diarrhea. These symptoms may represent a serious problem that is an emergency. Do not wait to see if the symptoms will go away. Use your auto-injector pen as you  have been instructed, and get medical help right away. Call your local emergency services (911 in the [place] Do not drive yourself to the hospital. General tips for using an auto-injector pen   Use epinephrine exactly as told by your health care provider. Do not inject it more often or in greater or smaller doses than your health care provider told you. Most auto-injector pens contain one dose of epinephrine. Some contain two doses.  Use the auto-injector pen to give yourself an injection under your skin or into your muscle on the outer side of your thigh. Do not give yourself an injection into your buttocks or any other part of your body. ? In an emergency, you can use your auto-injector pen through your clothing. ? After you inject a dose of epinephrine, some liquid may remain in your auto-injector pen. This is normal.  If you need to give yourself a second dose of epinephrine, give the second injection in another area on your outer thigh. Do not give two injections in exactly the same place on your body. This can lead to tissue damage.  From time to time: ? Check the expiration date on your auto-injector pen. ? Check the solution to ensure that it is not cloudy and that there are no particles floating in it. If your auto-injector pen is expired or if the solution is cloudy or has particles floating in it, throw it away and get a new one.  Ask your health care provider how to safely get rid of used or expired auto-injector pens.  Talk with your pharmacist or health care provider if you have questions  about how to inject epinephrine correctly. Get help right away if:  You inject epinephrine. You may need additional medical care, and you may need to be monitored for the side effects of epinephrine. The side effects include: ? Difficulty breathing. ? Fast or irregular heartbeat. ? Nausea or vomiting. ? Sweating. ? Dizziness. ? Nervousness or anxiety. ? Weakness. ? Pale  skin. ? Headache. ? Shaking that does not stop. Summary  An auto-injector pen (pre-filled automatic epinephrine injection device) is a device that is used to deliver epinephrine to the body.  An auto-injector pen is used to treat a life-threatening allergic reaction (anaphylaxis), asthma attack, or other emergency conditions.  You should always carry an auto-injector pen with you if you are at risk for anaphylaxis or severe asthma attacks.  Use of this device is safe. However, bone or tissue damage can occur if you do not follow instructions for injecting the medicine.  Talk with your pharmacist or health care provider if you have questions about how to inject epinephrine correctly. This information is not intended to replace advice given to you by your health care provider. Make sure you discuss any questions you have with your health care provider. Document Revised: 05/20/2018 Document Reviewed: 05/20/2018 Elsevier Patient Education  [employer].

## 2019-12-15 NOTE — Assessment & Plan Note (Signed)
Chronic Worsening recently Has always had significant swelling after bee sting, but this most recent episode 2 days ago she had swelling of the entire arm and had some tightness/swelling in her throat Improved with Benadryl and did take that a few times Agree with that she should have an EpiPen on hand Reviewed how to use an EpiPen and that if she does use that she does need to be evaluated in the emergency room due to the possibility of rebound reaction Advised always taking Benadryl as soon as possible after the bee sting EpiPen sent to pharmacy

## 2019-12-28 DIAGNOSIS — M533 Sacrococcygeal disorders, not elsewhere classified: Secondary | ICD-10-CM | POA: Diagnosis not present

## 2019-12-29 DIAGNOSIS — M9902 Segmental and somatic dysfunction of thoracic region: Secondary | ICD-10-CM | POA: Diagnosis not present

## 2019-12-29 DIAGNOSIS — S338XXA Sprain of other parts of lumbar spine and pelvis, initial encounter: Secondary | ICD-10-CM | POA: Diagnosis not present

## 2019-12-29 DIAGNOSIS — M9905 Segmental and somatic dysfunction of pelvic region: Secondary | ICD-10-CM | POA: Diagnosis not present

## 2019-12-29 DIAGNOSIS — M5386 Other specified dorsopathies, lumbar region: Secondary | ICD-10-CM | POA: Diagnosis not present

## 2019-12-29 DIAGNOSIS — M5414 Radiculopathy, thoracic region: Secondary | ICD-10-CM | POA: Diagnosis not present

## 2019-12-29 DIAGNOSIS — M9903 Segmental and somatic dysfunction of lumbar region: Secondary | ICD-10-CM | POA: Diagnosis not present

## 2020-01-18 DIAGNOSIS — L814 Other melanin hyperpigmentation: Secondary | ICD-10-CM | POA: Diagnosis not present

## 2020-01-26 DIAGNOSIS — S338XXA Sprain of other parts of lumbar spine and pelvis, initial encounter: Secondary | ICD-10-CM | POA: Diagnosis not present

## 2020-01-26 DIAGNOSIS — R69 Illness, unspecified: Secondary | ICD-10-CM | POA: Diagnosis not present

## 2020-01-26 DIAGNOSIS — M9903 Segmental and somatic dysfunction of lumbar region: Secondary | ICD-10-CM | POA: Diagnosis not present

## 2020-01-26 DIAGNOSIS — M5386 Other specified dorsopathies, lumbar region: Secondary | ICD-10-CM | POA: Diagnosis not present

## 2020-01-26 DIAGNOSIS — M9902 Segmental and somatic dysfunction of thoracic region: Secondary | ICD-10-CM | POA: Diagnosis not present

## 2020-01-26 DIAGNOSIS — M9905 Segmental and somatic dysfunction of pelvic region: Secondary | ICD-10-CM | POA: Diagnosis not present

## 2020-01-26 DIAGNOSIS — M5414 Radiculopathy, thoracic region: Secondary | ICD-10-CM | POA: Diagnosis not present

## 2020-02-07 DIAGNOSIS — R69 Illness, unspecified: Secondary | ICD-10-CM | POA: Diagnosis not present

## 2020-02-09 DIAGNOSIS — Z23 Encounter for immunization: Secondary | ICD-10-CM | POA: Diagnosis not present

## 2020-02-16 DIAGNOSIS — M533 Sacrococcygeal disorders, not elsewhere classified: Secondary | ICD-10-CM | POA: Diagnosis not present

## 2020-02-23 DIAGNOSIS — M9903 Segmental and somatic dysfunction of lumbar region: Secondary | ICD-10-CM | POA: Diagnosis not present

## 2020-02-23 DIAGNOSIS — M5414 Radiculopathy, thoracic region: Secondary | ICD-10-CM | POA: Diagnosis not present

## 2020-02-23 DIAGNOSIS — M9905 Segmental and somatic dysfunction of pelvic region: Secondary | ICD-10-CM | POA: Diagnosis not present

## 2020-02-23 DIAGNOSIS — M9902 Segmental and somatic dysfunction of thoracic region: Secondary | ICD-10-CM | POA: Diagnosis not present

## 2020-02-23 DIAGNOSIS — S338XXA Sprain of other parts of lumbar spine and pelvis, initial encounter: Secondary | ICD-10-CM | POA: Diagnosis not present

## 2020-02-23 DIAGNOSIS — M5386 Other specified dorsopathies, lumbar region: Secondary | ICD-10-CM | POA: Diagnosis not present

## 2020-03-07 DIAGNOSIS — M5386 Other specified dorsopathies, lumbar region: Secondary | ICD-10-CM | POA: Diagnosis not present

## 2020-03-07 DIAGNOSIS — S338XXA Sprain of other parts of lumbar spine and pelvis, initial encounter: Secondary | ICD-10-CM | POA: Diagnosis not present

## 2020-03-07 DIAGNOSIS — M5414 Radiculopathy, thoracic region: Secondary | ICD-10-CM | POA: Diagnosis not present

## 2020-03-07 DIAGNOSIS — M9903 Segmental and somatic dysfunction of lumbar region: Secondary | ICD-10-CM | POA: Diagnosis not present

## 2020-03-07 DIAGNOSIS — M9902 Segmental and somatic dysfunction of thoracic region: Secondary | ICD-10-CM | POA: Diagnosis not present

## 2020-03-07 DIAGNOSIS — M9905 Segmental and somatic dysfunction of pelvic region: Secondary | ICD-10-CM | POA: Diagnosis not present

## 2020-03-10 DIAGNOSIS — M533 Sacrococcygeal disorders, not elsewhere classified: Secondary | ICD-10-CM | POA: Diagnosis not present

## 2020-03-29 DIAGNOSIS — S338XXA Sprain of other parts of lumbar spine and pelvis, initial encounter: Secondary | ICD-10-CM | POA: Diagnosis not present

## 2020-03-29 DIAGNOSIS — M9903 Segmental and somatic dysfunction of lumbar region: Secondary | ICD-10-CM | POA: Diagnosis not present

## 2020-03-29 DIAGNOSIS — M9905 Segmental and somatic dysfunction of pelvic region: Secondary | ICD-10-CM | POA: Diagnosis not present

## 2020-03-29 DIAGNOSIS — M9902 Segmental and somatic dysfunction of thoracic region: Secondary | ICD-10-CM | POA: Diagnosis not present

## 2020-03-29 DIAGNOSIS — M5414 Radiculopathy, thoracic region: Secondary | ICD-10-CM | POA: Diagnosis not present

## 2020-03-29 DIAGNOSIS — M5386 Other specified dorsopathies, lumbar region: Secondary | ICD-10-CM | POA: Diagnosis not present

## 2020-04-19 DIAGNOSIS — M9902 Segmental and somatic dysfunction of thoracic region: Secondary | ICD-10-CM | POA: Diagnosis not present

## 2020-04-19 DIAGNOSIS — M5386 Other specified dorsopathies, lumbar region: Secondary | ICD-10-CM | POA: Diagnosis not present

## 2020-04-19 DIAGNOSIS — M9903 Segmental and somatic dysfunction of lumbar region: Secondary | ICD-10-CM | POA: Diagnosis not present

## 2020-04-19 DIAGNOSIS — S338XXA Sprain of other parts of lumbar spine and pelvis, initial encounter: Secondary | ICD-10-CM | POA: Diagnosis not present

## 2020-04-19 DIAGNOSIS — M9905 Segmental and somatic dysfunction of pelvic region: Secondary | ICD-10-CM | POA: Diagnosis not present

## 2020-04-19 DIAGNOSIS — M5414 Radiculopathy, thoracic region: Secondary | ICD-10-CM | POA: Diagnosis not present

## 2020-04-19 DIAGNOSIS — Z03818 Encounter for observation for suspected exposure to other biological agents ruled out: Secondary | ICD-10-CM | POA: Diagnosis not present

## 2020-04-20 DIAGNOSIS — Z03818 Encounter for observation for suspected exposure to other biological agents ruled out: Secondary | ICD-10-CM | POA: Diagnosis not present

## 2020-05-03 DIAGNOSIS — M5414 Radiculopathy, thoracic region: Secondary | ICD-10-CM | POA: Diagnosis not present

## 2020-05-03 DIAGNOSIS — M9903 Segmental and somatic dysfunction of lumbar region: Secondary | ICD-10-CM | POA: Diagnosis not present

## 2020-05-03 DIAGNOSIS — M5386 Other specified dorsopathies, lumbar region: Secondary | ICD-10-CM | POA: Diagnosis not present

## 2020-05-03 DIAGNOSIS — M9905 Segmental and somatic dysfunction of pelvic region: Secondary | ICD-10-CM | POA: Diagnosis not present

## 2020-05-03 DIAGNOSIS — S338XXA Sprain of other parts of lumbar spine and pelvis, initial encounter: Secondary | ICD-10-CM | POA: Diagnosis not present

## 2020-05-03 DIAGNOSIS — M9902 Segmental and somatic dysfunction of thoracic region: Secondary | ICD-10-CM | POA: Diagnosis not present

## 2020-05-24 DIAGNOSIS — M9902 Segmental and somatic dysfunction of thoracic region: Secondary | ICD-10-CM | POA: Diagnosis not present

## 2020-05-24 DIAGNOSIS — M5414 Radiculopathy, thoracic region: Secondary | ICD-10-CM | POA: Diagnosis not present

## 2020-05-24 DIAGNOSIS — M5386 Other specified dorsopathies, lumbar region: Secondary | ICD-10-CM | POA: Diagnosis not present

## 2020-05-24 DIAGNOSIS — S338XXA Sprain of other parts of lumbar spine and pelvis, initial encounter: Secondary | ICD-10-CM | POA: Diagnosis not present

## 2020-05-24 DIAGNOSIS — M9903 Segmental and somatic dysfunction of lumbar region: Secondary | ICD-10-CM | POA: Diagnosis not present

## 2020-05-24 DIAGNOSIS — M9905 Segmental and somatic dysfunction of pelvic region: Secondary | ICD-10-CM | POA: Diagnosis not present

## 2020-06-14 DIAGNOSIS — M9903 Segmental and somatic dysfunction of lumbar region: Secondary | ICD-10-CM | POA: Diagnosis not present

## 2020-06-14 DIAGNOSIS — S338XXA Sprain of other parts of lumbar spine and pelvis, initial encounter: Secondary | ICD-10-CM | POA: Diagnosis not present

## 2020-06-14 DIAGNOSIS — M9905 Segmental and somatic dysfunction of pelvic region: Secondary | ICD-10-CM | POA: Diagnosis not present

## 2020-06-14 DIAGNOSIS — M5414 Radiculopathy, thoracic region: Secondary | ICD-10-CM | POA: Diagnosis not present

## 2020-06-14 DIAGNOSIS — M5386 Other specified dorsopathies, lumbar region: Secondary | ICD-10-CM | POA: Diagnosis not present

## 2020-06-14 DIAGNOSIS — M9902 Segmental and somatic dysfunction of thoracic region: Secondary | ICD-10-CM | POA: Diagnosis not present

## 2020-07-05 DIAGNOSIS — M5386 Other specified dorsopathies, lumbar region: Secondary | ICD-10-CM | POA: Diagnosis not present

## 2020-07-05 DIAGNOSIS — M9902 Segmental and somatic dysfunction of thoracic region: Secondary | ICD-10-CM | POA: Diagnosis not present

## 2020-07-05 DIAGNOSIS — M9903 Segmental and somatic dysfunction of lumbar region: Secondary | ICD-10-CM | POA: Diagnosis not present

## 2020-07-05 DIAGNOSIS — M9905 Segmental and somatic dysfunction of pelvic region: Secondary | ICD-10-CM | POA: Diagnosis not present

## 2020-07-05 DIAGNOSIS — M5414 Radiculopathy, thoracic region: Secondary | ICD-10-CM | POA: Diagnosis not present

## 2020-07-05 DIAGNOSIS — S338XXA Sprain of other parts of lumbar spine and pelvis, initial encounter: Secondary | ICD-10-CM | POA: Diagnosis not present

## 2020-08-02 DIAGNOSIS — M5386 Other specified dorsopathies, lumbar region: Secondary | ICD-10-CM | POA: Diagnosis not present

## 2020-08-02 DIAGNOSIS — M9902 Segmental and somatic dysfunction of thoracic region: Secondary | ICD-10-CM | POA: Diagnosis not present

## 2020-08-02 DIAGNOSIS — S338XXA Sprain of other parts of lumbar spine and pelvis, initial encounter: Secondary | ICD-10-CM | POA: Diagnosis not present

## 2020-08-02 DIAGNOSIS — M9903 Segmental and somatic dysfunction of lumbar region: Secondary | ICD-10-CM | POA: Diagnosis not present

## 2020-08-02 DIAGNOSIS — M5414 Radiculopathy, thoracic region: Secondary | ICD-10-CM | POA: Diagnosis not present

## 2020-08-02 DIAGNOSIS — M9905 Segmental and somatic dysfunction of pelvic region: Secondary | ICD-10-CM | POA: Diagnosis not present

## 2020-08-23 DIAGNOSIS — M5414 Radiculopathy, thoracic region: Secondary | ICD-10-CM | POA: Diagnosis not present

## 2020-08-23 DIAGNOSIS — M5386 Other specified dorsopathies, lumbar region: Secondary | ICD-10-CM | POA: Diagnosis not present

## 2020-08-23 DIAGNOSIS — M9902 Segmental and somatic dysfunction of thoracic region: Secondary | ICD-10-CM | POA: Diagnosis not present

## 2020-08-23 DIAGNOSIS — M9903 Segmental and somatic dysfunction of lumbar region: Secondary | ICD-10-CM | POA: Diagnosis not present

## 2020-08-23 DIAGNOSIS — M9905 Segmental and somatic dysfunction of pelvic region: Secondary | ICD-10-CM | POA: Diagnosis not present

## 2020-08-23 DIAGNOSIS — S338XXA Sprain of other parts of lumbar spine and pelvis, initial encounter: Secondary | ICD-10-CM | POA: Diagnosis not present

## 2020-09-13 DIAGNOSIS — M5414 Radiculopathy, thoracic region: Secondary | ICD-10-CM | POA: Diagnosis not present

## 2020-09-13 DIAGNOSIS — M9903 Segmental and somatic dysfunction of lumbar region: Secondary | ICD-10-CM | POA: Diagnosis not present

## 2020-09-13 DIAGNOSIS — M9905 Segmental and somatic dysfunction of pelvic region: Secondary | ICD-10-CM | POA: Diagnosis not present

## 2020-09-13 DIAGNOSIS — S338XXA Sprain of other parts of lumbar spine and pelvis, initial encounter: Secondary | ICD-10-CM | POA: Diagnosis not present

## 2020-09-13 DIAGNOSIS — M5386 Other specified dorsopathies, lumbar region: Secondary | ICD-10-CM | POA: Diagnosis not present

## 2020-09-13 DIAGNOSIS — M9902 Segmental and somatic dysfunction of thoracic region: Secondary | ICD-10-CM | POA: Diagnosis not present

## 2020-09-14 ENCOUNTER — Telehealth: Payer: Self-pay | Admitting: Internal Medicine

## 2020-09-14 DIAGNOSIS — I10 Essential (primary) hypertension: Secondary | ICD-10-CM

## 2020-09-14 DIAGNOSIS — M85852 Other specified disorders of bone density and structure, left thigh: Secondary | ICD-10-CM

## 2020-09-14 DIAGNOSIS — R7989 Other specified abnormal findings of blood chemistry: Secondary | ICD-10-CM

## 2020-09-14 DIAGNOSIS — Z0000 Encounter for general adult medical examination without abnormal findings: Secondary | ICD-10-CM

## 2020-09-14 NOTE — Telephone Encounter (Signed)
Routine blood work ordered.  There is a blood test to do for ovarian cancer, but is not a routine blood test and typically is only done in certain circumstances.  Most likely would not be covered by her insurance as a screening test because insurances do not cover it.  I am not sure how much it costs.  I can add it if she wishes.

## 2020-09-14 NOTE — Telephone Encounter (Signed)
Patient called and was wondering if she could have labs done before her 10-13-20 CPE. She was also wondering if a test for ovarian cancer could also be done with the blood work. She can be reached at [phone]. Please advise

## 2020-09-14 NOTE — Telephone Encounter (Signed)
Spoke with patient and lab appointment made.   She will hold off on testing for ovarian cancer for now.

## 2020-09-27 ENCOUNTER — Other Ambulatory Visit: Payer: Self-pay | Admitting: Internal Medicine

## 2020-09-27 DIAGNOSIS — Z1231 Encounter for screening mammogram for malignant neoplasm of breast: Secondary | ICD-10-CM

## 2020-10-09 DIAGNOSIS — M9905 Segmental and somatic dysfunction of pelvic region: Secondary | ICD-10-CM | POA: Diagnosis not present

## 2020-10-09 DIAGNOSIS — M5386 Other specified dorsopathies, lumbar region: Secondary | ICD-10-CM | POA: Diagnosis not present

## 2020-10-09 DIAGNOSIS — S338XXA Sprain of other parts of lumbar spine and pelvis, initial encounter: Secondary | ICD-10-CM | POA: Diagnosis not present

## 2020-10-09 DIAGNOSIS — M5414 Radiculopathy, thoracic region: Secondary | ICD-10-CM | POA: Diagnosis not present

## 2020-10-09 DIAGNOSIS — M9903 Segmental and somatic dysfunction of lumbar region: Secondary | ICD-10-CM | POA: Diagnosis not present

## 2020-10-09 DIAGNOSIS — M9902 Segmental and somatic dysfunction of thoracic region: Secondary | ICD-10-CM | POA: Diagnosis not present

## 2020-10-10 ENCOUNTER — Other Ambulatory Visit (INDEPENDENT_AMBULATORY_CARE_PROVIDER_SITE_OTHER): Payer: Medicare HMO

## 2020-10-10 ENCOUNTER — Other Ambulatory Visit: Payer: Self-pay

## 2020-10-10 DIAGNOSIS — R7989 Other specified abnormal findings of blood chemistry: Secondary | ICD-10-CM

## 2020-10-10 DIAGNOSIS — I10 Essential (primary) hypertension: Secondary | ICD-10-CM | POA: Diagnosis not present

## 2020-10-10 DIAGNOSIS — Z0000 Encounter for general adult medical examination without abnormal findings: Secondary | ICD-10-CM

## 2020-10-10 DIAGNOSIS — M85852 Other specified disorders of bone density and structure, left thigh: Secondary | ICD-10-CM | POA: Diagnosis not present

## 2020-10-10 LAB — COMPREHENSIVE METABOLIC PANEL
ALT: 16 U/L (ref 0–35)
AST: 17 U/L (ref 0–37)
Albumin: 4.3 g/dL (ref 3.5–5.2)
Alkaline Phosphatase: 54 U/L (ref 39–117)
BUN: 17 mg/dL (ref 6–23)
CO2: 28 mEq/L (ref 19–32)
Calcium: 9.3 mg/dL (ref 8.4–10.5)
Chloride: 104 mEq/L (ref 96–112)
Creatinine, Ser: 0.78 mg/dL (ref 0.40–1.20)
GFR: 74.12 mL/min (ref >60.00)
Glucose, Bld: 94 mg/dL (ref 70–99)
Potassium: 4.3 mEq/L (ref 3.5–5.1)
Sodium: 139 mEq/L (ref 135–145)
Total Bilirubin: 0.5 mg/dL (ref 0.2–1.2)
Total Protein: 6.8 g/dL (ref 6.0–8.3)

## 2020-10-10 LAB — LIPID PANEL
Cholesterol: 212 mg/dL — ABNORMAL HIGH (ref 0–200)
HDL: 98.8 mg/dL (ref >39.00)
LDL Cholesterol: 95 mg/dL (ref 0–99)
NonHDL: 113.29
Total CHOL/HDL Ratio: 2
Triglycerides: 90 mg/dL (ref 0.0–149.0)
VLDL: 18 mg/dL (ref 0.0–40.0)

## 2020-10-10 LAB — VITAMIN D 25 HYDROXY (VIT D DEFICIENCY, FRACTURES): VITD: 38.91 ng/mL (ref 30.00–100.00)

## 2020-10-10 LAB — CBC WITH DIFFERENTIAL/PLATELET
Basophils Absolute: 0.1 10*3/uL (ref 0.0–0.1)
Basophils Relative: 1.2 % (ref 0.0–3.0)
Eosinophils Absolute: 0.5 10*3/uL (ref 0.0–0.7)
Eosinophils Relative: 9 % — ABNORMAL HIGH (ref 0.0–5.0)
HCT: 39.8 % (ref 36.0–46.0)
Hemoglobin: 13.6 g/dL (ref 12.0–15.0)
Lymphocytes Relative: 25.2 % (ref 12.0–46.0)
Lymphs Abs: 1.4 10*3/uL (ref 0.7–4.0)
MCHC: 34.2 g/dL (ref 30.0–36.0)
MCV: 97.6 fl (ref 78.0–100.0)
Monocytes Absolute: 0.5 10*3/uL (ref 0.1–1.0)
Monocytes Relative: 8.6 % (ref 3.0–12.0)
Neutro Abs: 3.1 10*3/uL (ref 1.4–7.7)
Neutrophils Relative %: 56 % (ref 43.0–77.0)
Platelets: 257 10*3/uL (ref 150.0–400.0)
RBC: 4.07 Mil/uL (ref 3.87–5.11)
RDW: 13.1 % (ref 11.5–15.5)
WBC: 5.5 10*3/uL (ref 4.0–10.5)

## 2020-10-10 LAB — TSH: TSH: 0.11 u[IU]/mL — ABNORMAL LOW (ref 0.35–4.50)

## 2020-10-12 ENCOUNTER — Encounter: Payer: Self-pay | Admitting: Internal Medicine

## 2020-10-12 NOTE — Progress Notes (Signed)
Subjective:    Patient ID: Margaret Craig, female    DOB: 03-31-1946, 75 y.o.   MRN: [id number]   This visit occurred during the SARS-CoV-2 public health emergency.  Safety protocols were in place, including screening questions prior to the visit, additional usage of staff PPE, and extensive cleaning of exam room while observing appropriate contact time as indicated for disinfecting solutions.    HPI She is here for a physical exam.   Yesterday she got 8 yellow jacket stings.  She took a lot of benadryl.  She did not have any anaphylaxis and did not need to use her EpiPen.  She still has some swelling associated with the stings, but the pain has resolved.   BP at home in am 90's/?, later in day may be 120-130/?  She is currently taking 5 mg daily.  Medications and allergies reviewed with patient and updated if appropriate.  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     Calcium Carbonate-Vitamin D 600-200 MG-UNIT TABS Take by mouth.     cholecalciferol (VITAMIN D3) 25 MCG (1000 UNIT) tablet Take 1,000 Units by mouth daily.     EPINEPHrine 0.3 mg/0.3 mL IJ SOAJ injection Inject 0.3 mLs (0.3 mg total) into the muscle as needed for anaphylaxis. 1 each 0   Multiple Vitamins-Minerals (MULTIVITAMIN WITH MINERALS) tablet Take 1 tablet by mouth daily.     Multiple Vitamins-Minerals (ZINC PO) Take by mouth.     No current facility-administered medications on file prior to visit.    Past Medical History:  Diagnosis Date   Allergy    Arthritis    Fainting spell    Hypertension    Thyroid disease     UTI (urinary tract infection)     Past Surgical History:  Procedure Laterality Date   COLONOSCOPY     SINUS IRRIGATION     TONSILLECTOMY      Social History   Socioeconomic History   Marital status: Married    Spouse name: Not on file   Number of children: Not on file   Years of education: Not on file   Highest education level: Not on file  Occupational History   Not on file  Tobacco Use   Smoking status: Never   Smokeless tobacco: Never  Vaping Use   Vaping Use: Never used  Substance and Sexual Activity   Alcohol use: Yes    Alcohol/week: 6.0 standard drinks    Types: 6 Standard drinks or equivalent per week   Drug use: Never   Sexual activity: Not on file  Other Topics Concern   Not on file  Social History Narrative   Not on file   Social Determinants of Health   Financial Resource Strain: Not on file  Food Insecurity: Not on file  Transportation Needs: Not on file  Physical Activity: Not on file  Stress: Not on file  Social Connections: Not on file    Family History  Problem Relation Age of Onset   Mental illness Mother    Colon cancer Mother 38   Alcohol  abuse Son    Mental illness Son    Cancer Maternal Aunt        breast   Heart disease Maternal Aunt    Colon cancer Maternal Aunt        diagnosed in her 49s   Heart disease Paternal Aunt    Alcohol abuse Paternal Uncle    Cancer Maternal Grandmother        breast   Colon cancer Maternal Grandmother    Heart disease Maternal Grandfather    Esophageal cancer Neg Hx    Stomach cancer Neg Hx    Rectal cancer Neg Hx     Review of Systems  Constitutional:  Negative for chills and fever.  Eyes:  Negative for visual disturbance.  Respiratory:  Negative for cough, shortness of breath and wheezing.   Cardiovascular:  Negative for chest pain, palpitations and leg swelling.  Gastrointestinal:  Negative for abdominal pain, blood in stool, constipation, diarrhea and nausea.       No gerd   Genitourinary:  Negative for dysuria and hematuria.  Musculoskeletal:  Positive for back pain (sees chiropractor 1/ mo, go for massage). Negative for arthralgias.       Occ sciatica  Skin:  Negative for rash.  Neurological:  Positive for numbness (occ with sciatica). Negative for dizziness, light-headedness and headaches.  Psychiatric/Behavioral:  Positive for sleep disturbance. Negative for dysphoric mood. The patient is not nervous/anxious.       Objective:   Vitals:   10/13/20 0932  BP: 110/70  Pulse: (!) 59  Temp: 97.9 F (36.6 C)  SpO2: 97%   Filed Weights   10/13/20 0932  Weight: 110 lb (49.9 kg)   Body mass index is 18.88 kg/m.  BP Readings from Last 3 Encounters:  10/13/20 110/70  12/15/19 116/72  10/12/19 106/78    Wt Readings from Last 3 Encounters:  10/13/20 110 lb (49.9 kg)  12/15/19 110 lb (49.9 kg)  10/12/19 109 lb 6.4 oz (49.6 kg)     Physical Exam Constitutional: She appears well-developed and well-nourished. No distress.  HENT:  Head: Normocephalic and atraumatic.  Right Ear: External ear normal. Normal ear canal and TM Left Ear: External ear normal.  Normal ear canal and TM Mouth/Throat: Oropharynx is clear and moist.  Eyes: Conjunctivae and EOM are normal.  Neck: Neck supple. No tracheal deviation present. No thyromegaly present.  No carotid bruit  Cardiovascular: Normal rate, regular rhythm and normal heart sounds.   No murmur heard.  No edema. Pulmonary/Chest: Effort normal and breath sounds normal. No respiratory distress. She has no wheezes. She has no rales.  Breast: deferred   Abdominal: Soft. She exhibits no distension. There is no tenderness.  Lymphadenopathy: She has no cervical adenopathy.  Skin: Skin is warm and dry. She is not diaphoretic.  Psychiatric: She has a normal mood and affect. Her behavior is normal.        Assessment & Plan:   Physical exam: Screening blood work    reviewed Immunizations  had shignrix, had  covid boosters Colonoscopy  Up to date  Mammogram  scheduled Gyn  n/a Dexa  due this July - [facility] - ordered Eye exams  Up to date  Exercise  regular - walking, yard work Weight  normal  Substance abuse  none Sees derm annually     See Problem List for Assessment and Plan of chronic medical problems.

## 2020-10-12 NOTE — Patient Instructions (Addendum)
Blood work reviewed.   Bone density ordered.     Have blood work done in about three months at the [facility] - you do not need to fast.  Hold your biotin for a few days prior to having the blood work done.     Medications changes include :   decrease lisinopril to 2.5 mg daily  Your prescription(s) have been submitted to your pharmacy. Please take as directed and contact our office if you believe you are having problem(s) with the medication(s).   Please followup in 1 year    Health Maintenance, Female Adopting a healthy lifestyle and getting preventive care are important in promoting health and wellness. Ask your health care provider about: The right schedule for you to have regular tests and exams. Things you can do on your own to prevent diseases and keep yourself healthy. What should I know about diet, weight, and exercise? Eat a healthy diet  Eat a diet that includes plenty of vegetables, fruits, low-fat dairy products, and lean protein. Do not eat a lot of foods that are high in solid fats, added sugars, or sodium.  Maintain a healthy weight Body mass index (BMI) is used to identify weight problems. It estimates body fat based on height and weight. Your health care provider can help determineyour BMI and help you achieve or maintain a healthy weight. Get regular exercise Get regular exercise. This is one of the most important things you can do for your health. Most adults should: Exercise for at least 150 minutes each week. The exercise should increase your heart rate and make you sweat (moderate-intensity exercise). Do strengthening exercises at least twice a week. This is in addition to the moderate-intensity exercise. Spend less time sitting. Even light physical activity can be beneficial. Watch cholesterol and blood lipids Have your blood tested for lipids and cholesterol at 76 years of age, then havethis test every 5 years. Have your cholesterol levels checked more often  if: Your lipid or cholesterol levels are high. You are older than 76 years of age. You are at high risk for heart disease. What should I know about cancer screening? Depending on your health history and family history, you may need to have cancer screening at various ages. This may include screening for: Breast cancer. Cervical cancer. Colorectal cancer. Skin cancer. Lung cancer. What should I know about heart disease, diabetes, and high blood pressure? Blood pressure and heart disease High blood pressure causes heart disease and increases the risk of stroke. This is more likely to develop in people who have high blood pressure readings, are of African descent, or are overweight. Have your blood pressure checked: Every 3-5 years if you are 40-41 years of age. Every year if you are 45 years old or older. Diabetes Have regular diabetes screenings. This checks your fasting blood sugar level. Have the screening done: Once every three years after age 79 if you are at a normal weight and have a low risk for diabetes. More often and at a younger age if you are overweight or have a high risk for diabetes. What should I know about preventing infection? Hepatitis B If you have a higher risk for hepatitis B, you should be screened for this virus. Talk with your health care provider to find out if you are at risk forhepatitis B infection. Hepatitis C Testing is recommended for: Everyone born from [birth year] through 1966. Anyone with known risk factors for hepatitis C. Sexually transmitted infections (STIs) Get screened  for STIs, including gonorrhea and chlamydia, if: You are sexually active and are younger than 76 years of age. You are older than 76 years of age and your health care provider tells you that you are at risk for this type of infection. Your sexual activity has changed since you were last screened, and you are at increased risk for chlamydia or gonorrhea. Ask your health care provider if  you are at risk. Ask your health care provider about whether you are at high risk for HIV. Your health care provider may recommend a prescription medicine to help prevent HIV infection. If you choose to take medicine to prevent HIV, you should first get tested for HIV. You should then be tested every 3 months for as long as you are taking the medicine. Pregnancy If you are about to stop having your period (premenopausal) and you may become pregnant, seek counseling before you get pregnant. Take 400 to 800 micrograms (mcg) of folic acid every day if you become pregnant. Ask for birth control (contraception) if you want to prevent pregnancy. Osteoporosis and menopause Osteoporosis is a disease in which the bones lose minerals and strength with aging. This can result in bone fractures. If you are 28 years old or older, or if you are at risk for osteoporosis and fractures, ask your health care provider if you should: Be screened for bone loss. Take a calcium or vitamin D supplement to lower your risk of fractures. Be given hormone replacement therapy (HRT) to treat symptoms of menopause. Follow these instructions at home: Lifestyle Do not use any products that contain nicotine or tobacco, such as cigarettes, e-cigarettes, and chewing tobacco. If you need help quitting, ask your health care provider. Do not use street drugs. Do not share needles. Ask your health care provider for help if you need support or information about quitting drugs. Alcohol use Do not drink alcohol if: Your health care provider tells you not to drink. You are pregnant, may be pregnant, or are planning to become pregnant. If you drink alcohol: Limit how much you use to 0-1 drink a day. Limit intake if you are breastfeeding. Be aware of how much alcohol is in your drink. In the [place] one drink equals one 12 oz bottle of beer (355 mL), one 5 oz glass of wine (148 mL), or one 1 oz glass of hard liquor (44 mL). General  instructions Schedule regular health, dental, and eye exams. Stay current with your vaccines. Tell your health care provider if: You often feel depressed. You have ever been abused or do not feel safe at home. Summary Adopting a healthy lifestyle and getting preventive care are important in promoting health and wellness. Follow your health care provider's instructions about healthy diet, exercising, and getting tested or screened for diseases. Follow your health care provider's instructions on monitoring your cholesterol and blood pressure. This information is not intended to replace advice given to you by your health care provider. Make sure you discuss any questions you have with your healthcare provider. Document Revised: 04/01/2018 Document Reviewed: 04/01/2018 Elsevier Patient Education  2022 [employer].

## 2020-10-13 ENCOUNTER — Other Ambulatory Visit: Payer: Self-pay

## 2020-10-13 ENCOUNTER — Ambulatory Visit (INDEPENDENT_AMBULATORY_CARE_PROVIDER_SITE_OTHER): Payer: Medicare HMO | Admitting: Internal Medicine

## 2020-10-13 VITALS — BP 110/70 | HR 59 | Temp 97.9°F | Ht 64.0 in | Wt 110.0 lb

## 2020-10-13 DIAGNOSIS — I10 Essential (primary) hypertension: Secondary | ICD-10-CM | POA: Diagnosis not present

## 2020-10-13 DIAGNOSIS — Z0000 Encounter for general adult medical examination without abnormal findings: Secondary | ICD-10-CM

## 2020-10-13 DIAGNOSIS — R7989 Other specified abnormal findings of blood chemistry: Secondary | ICD-10-CM | POA: Diagnosis not present

## 2020-10-13 DIAGNOSIS — E0590 Thyrotoxicosis, unspecified without thyrotoxic crisis or storm: Secondary | ICD-10-CM | POA: Insufficient documentation

## 2020-10-13 DIAGNOSIS — M85852 Other specified disorders of bone density and structure, left thigh: Secondary | ICD-10-CM

## 2020-10-13 MED ORDER — LISINOPRIL 5 MG PO TABS: 2.5000 mg | ORAL_TABLET | Freq: Every day | ORAL | 3 refills | Status: DC

## 2020-10-13 MED ORDER — LISINOPRIL 5 MG PO TABS
2.5000 mg | ORAL_TABLET | Freq: Every day | ORAL | 3 refills | Status: DC
Start: 2020-10-13 — End: 2020-10-13

## 2020-10-13 NOTE — Assessment & Plan Note (Signed)
Chronic DEXA due-ordered Continue regular walking/exercise Continue calcium and vitamin D

## 2020-10-13 NOTE — Assessment & Plan Note (Signed)
Chronic Blood pressure overcontrolled most of the time-often in the 90s/?  In the morning Currently taking 5 mg of lisinopril daily Will decrease this to lisinopril 2.5 mg daily CMP reviewed-normal

## 2020-10-13 NOTE — Assessment & Plan Note (Signed)
Chronic Intermittent low TSH Asymptomatic She has recently started biotin Will recheck TSH in about 3 months-hold biotin for a few days prior to this Discussed possible endocrine referral, but will hold off for now and see what her next TSH is given that she is asymptomatic

## 2020-10-30 DIAGNOSIS — M5386 Other specified dorsopathies, lumbar region: Secondary | ICD-10-CM | POA: Diagnosis not present

## 2020-10-30 DIAGNOSIS — M5414 Radiculopathy, thoracic region: Secondary | ICD-10-CM | POA: Diagnosis not present

## 2020-10-30 DIAGNOSIS — M9903 Segmental and somatic dysfunction of lumbar region: Secondary | ICD-10-CM | POA: Diagnosis not present

## 2020-10-30 DIAGNOSIS — S338XXA Sprain of other parts of lumbar spine and pelvis, initial encounter: Secondary | ICD-10-CM | POA: Diagnosis not present

## 2020-10-30 DIAGNOSIS — M9905 Segmental and somatic dysfunction of pelvic region: Secondary | ICD-10-CM | POA: Diagnosis not present

## 2020-10-30 DIAGNOSIS — M9902 Segmental and somatic dysfunction of thoracic region: Secondary | ICD-10-CM | POA: Diagnosis not present

## 2020-11-02 DIAGNOSIS — L814 Other melanin hyperpigmentation: Secondary | ICD-10-CM | POA: Diagnosis not present

## 2020-11-02 DIAGNOSIS — C44619 Basal cell carcinoma of skin of left upper limb, including shoulder: Secondary | ICD-10-CM | POA: Diagnosis not present

## 2020-11-02 DIAGNOSIS — D2272 Melanocytic nevi of left lower limb, including hip: Secondary | ICD-10-CM | POA: Diagnosis not present

## 2020-11-02 DIAGNOSIS — D1801 Hemangioma of skin and subcutaneous tissue: Secondary | ICD-10-CM | POA: Diagnosis not present

## 2020-11-02 DIAGNOSIS — L821 Other seborrheic keratosis: Secondary | ICD-10-CM | POA: Diagnosis not present

## 2020-11-02 DIAGNOSIS — L570 Actinic keratosis: Secondary | ICD-10-CM | POA: Diagnosis not present

## 2020-11-02 DIAGNOSIS — C44719 Basal cell carcinoma of skin of left lower limb, including hip: Secondary | ICD-10-CM | POA: Diagnosis not present

## 2020-11-02 DIAGNOSIS — D225 Melanocytic nevi of trunk: Secondary | ICD-10-CM | POA: Diagnosis not present

## 2020-11-02 DIAGNOSIS — D3612 Benign neoplasm of peripheral nerves and autonomic nervous system, upper limb, including shoulder: Secondary | ICD-10-CM | POA: Diagnosis not present

## 2020-11-02 DIAGNOSIS — Z85828 Personal history of other malignant neoplasm of skin: Secondary | ICD-10-CM | POA: Diagnosis not present

## 2020-11-02 DIAGNOSIS — L820 Inflamed seborrheic keratosis: Secondary | ICD-10-CM | POA: Diagnosis not present

## 2020-11-13 ENCOUNTER — Ambulatory Visit (INDEPENDENT_AMBULATORY_CARE_PROVIDER_SITE_OTHER): Payer: Medicare HMO

## 2020-11-13 ENCOUNTER — Ambulatory Visit: Payer: Medicare HMO

## 2020-11-13 ENCOUNTER — Telehealth: Payer: Self-pay

## 2020-11-13 ENCOUNTER — Other Ambulatory Visit: Payer: Self-pay

## 2020-11-13 DIAGNOSIS — Z0000 Encounter for general adult medical examination without abnormal findings: Secondary | ICD-10-CM | POA: Diagnosis not present

## 2020-11-13 NOTE — Progress Notes (Signed)
I connected with Margaret Craig. [place] today by telephone and verified that I am speaking with the correct person using two identifiers. Location patient: home Location provider: work Persons participating in the virtual visit: SHARICE RICKELS and Lisette Abu, LPN.   I discussed the limitations, risks, security and privacy concerns of performing an evaluation and management service by telephone and the availability of in person appointments. I also discussed with the patient that there may be a patient responsible charge related to this service. The patient expressed understanding and verbally consented to this telephonic visit.    Interactive audio and video telecommunications were attempted between this provider and patient, however failed, due to patient having technical difficulties OR patient did not have access to video capability.  We continued and completed visit with audio only.  Some vital signs may be absent or patient reported.   Time Spent with patient on telephone encounter: 30 minutes  Subjective:   Margaret Craig is a 75 y.o. female who presents for Medicare Annual (Subsequent) preventive examination.  Review of Systems     Cardiac Risk Factors include: advanced age (>83mn, >>62women);family history of premature cardiovascular disease;hypertension     Objective:    There were no vitals filed for this visit. There is no height or weight on file to calculate BMI.  Advanced Directives 11/13/2020  Does Patient Have a Medical Advance Directive? Yes  Type of Advance Directive Living will;Healthcare Power of Attorney  Does patient want to make changes to medical advance directive? No - Patient declined  Copy of [place] Chart? No - copy requested    Current Medications (verified) Outpatient Encounter Medications as of 11/13/2020  Medication Sig   BIOTIN PO Take by mouth.   Calcium Carbonate-Vitamin D 600-200 MG-UNIT TABS Take by  mouth.   cholecalciferol (VITAMIN D3) 25 MCG (1000 UNIT) tablet Take 1,000 Units by mouth daily.   EPINEPHrine 0.3 mg/0.3 mL IJ SOAJ injection Inject 0.3 mLs (0.3 mg total) into the muscle as needed for anaphylaxis.   lisinopril (ZESTRIL) 5 MG tablet Take 0.5 tablets (2.5 mg total) by mouth daily.   Multiple Vitamins-Minerals (MULTIVITAMIN WITH MINERALS) tablet Take 1 tablet by mouth daily.   Multiple Vitamins-Minerals (ZINC PO) Take by mouth.   No facility-administered encounter medications on file as of 11/13/2020.    Allergies (verified) Bee venom and Codeine   History: Past Medical History:  Diagnosis Date   Allergy    Arthritis    Fainting spell    Hypertension    Thyroid disease    UTI (urinary tract infection)    Past Surgical History:  Procedure Laterality Date   COLONOSCOPY     SINUS IRRIGATION     TONSILLECTOMY     Family History  Problem Relation Age of Onset   Mental illness Mother    Colon cancer Mother 986  Alcohol abuse Son    Mental illness Son    Cancer Maternal Aunt        breast   Heart disease Maternal Aunt    Colon cancer Maternal Aunt        diagnosed in her 721s  Heart disease Paternal Aunt    Alcohol abuse Paternal Uncle    Cancer Maternal Grandmother        breast   Colon cancer Maternal Grandmother    Heart disease Maternal Grandfather    Esophageal cancer Neg Hx    Stomach cancer Neg Hx  Rectal cancer Neg Hx    Social History   Socioeconomic History   Marital status: Married    Spouse name: Not on file   Number of children: Not on file   Years of education: Not on file   Highest education level: Not on file  Occupational History   Not on file  Tobacco Use   Smoking status: Never   Smokeless tobacco: Never  Vaping Use   Vaping Use: Never used  Substance and Sexual Activity   Alcohol use: Yes    Alcohol/week: 6.0 standard drinks    Types: 6 Standard drinks or equivalent per week   Drug use: Never   Sexual activity: Not on  file  Other Topics Concern   Not on file  Social History Narrative   Not on file   Social Determinants of Health   Financial Resource Strain: Low Risk    Difficulty of Paying Living Expenses: Not hard at all  Food Insecurity: No Food Insecurity   Worried About Charity fundraiser in the Last Year: Never true   [place] in the Last Year: Never true  Transportation Needs: No Transportation Needs   Lack of Transportation (Medical): No   Lack of Transportation (Non-Medical): No  Physical Activity: Sufficiently Active   Days of Exercise per Week: 7 days   Minutes of Exercise per Session: 30 min  Stress: No Stress Concern Present   Feeling of Stress : Not at all  Social Connections: Moderately Integrated   Frequency of Communication with Friends and Family: More than three times a week   Frequency of Social Gatherings with Friends and Family: More than three times a week   Attends Religious Services: More than 4 times per year   Active Member of [employer] or Organizations: Yes   Attends Music therapist: More than 4 times per year   Marital Status: Never married    Tobacco Counseling Counseling given: Not Answered   Clinical Intake:  Pre-visit preparation completed: Yes  Pain : No/denies pain     Nutritional Risks: None Diabetes: No  How often do you need to have someone help you when you read instructions, pamphlets, or other written materials from your doctor or pharmacy?: 1 - Never What is the last grade level you completed in school?: Bachelor's Degree in Education  Diabetic? no  Interpreter Needed?: No  Information entered by :: Lisette Abu, LPN   Activities of Daily Living In your present state of health, do you have any difficulty performing the following activities: 11/13/2020  Hearing? N  Vision? N  Difficulty concentrating or making decisions? N  Walking or climbing stairs? N  Dressing or bathing? N  Doing errands, shopping? N   Preparing Food and eating ? N  Using the Toilet? N  In the past six months, have you accidently leaked urine? N  Do you have problems with loss of bowel control? N  Managing your Medications? N  Managing your Finances? N  Some recent data might be hidden    Patient Care Team: Binnie Rail, MD as PCP - General (Internal Medicine)  Indicate any recent Medical Services you may have received from other than [facility] providers in the past year (date may be approximate).     Assessment:   This is a routine wellness examination for Nini.  Hearing/Vision screen Hearing Screening - Comments:: Patient denied any hearing issues.  Dietary issues and exercise activities discussed: Current Exercise Habits: Home exercise  routine, Type of exercise: walking, Time (Minutes): 30, Frequency (Times/Week): 7, Weekly Exercise (Minutes/Week): 210, Intensity: Moderate, Exercise limited by: None identified   Goals Addressed               This Visit's Progress     Patient Stated (pt-stated)        Client understands the importance of follow-up with providers by attending scheduled visits and discussed goals to eat healthier, increase physical activity, exercise the brain, socialize more,  get enough rest and make time for laughter.      Depression Screen PHQ 2/9 Scores 11/13/2020 10/13/2020 10/12/2019 10/07/2018 08/18/2018 07/01/2017 05/29/2015  PHQ - 2 Score 0 0 0 0 0 0 0    Fall Risk Fall Risk  11/13/2020 10/13/2020 07/27/2019 10/07/2018 08/18/2018  Falls in the past year? 0 0 0 0 0  Number falls in past yr: 0 0 0 0 -  Injury with Fall? 0 0 0 - -  Risk for fall due to : No Fall Risks - - - -  Follow up Falls evaluation completed - Falls evaluation completed - -    FALL RISK PREVENTION PERTAINING TO THE HOME:  Any stairs in or around the home? Yes  If so, are there any without handrails? No  Home free of loose throw rugs in walkways, pet beds, electrical cords, etc? Yes  Adequate lighting in your  home to reduce risk of falls? Yes   ASSISTIVE DEVICES UTILIZED TO PREVENT FALLS:  Life alert? No  Use of a cane, walker or w/c? No  Grab bars in the bathroom? No  Shower chair or bench in shower? No  Elevated toilet seat or a handicapped toilet? No   TIMED UP AND GO:  Was the test performed? No .  Length of time to ambulate 10 feet: 0 sec.   Gait steady and fast without use of assistive device  Cognitive Function:Normal cognitive status assessed by direct observation by this Nurse Health Advisor. No abnormalities found.          Immunizations Immunization History  Administered Date(s) Administered   Fluad Quad(high Dose 65+) 12/31/2018   Influenza, High Dose Seasonal PF 02/21/2016, 01/13/2018   Influenza,inj,Quad PF,6+ Mos 02/03/2014   Influenza-Unspecified 02/04/2015, 01/20/2017   PFIZER(Purple Top)SARS-COV-2 Vaccination 05/16/2019, 06/07/2019, 09/03/2020   Pneumococcal Conjugate-13 06/06/2014   Pneumococcal Polysaccharide-23 04/22/2013   Zoster Recombinat (Shingrix) 09/19/2016    TDAP status: Due, Education has been provided regarding the importance of this vaccine. Advised may receive this vaccine at local pharmacy or Health Dept. Aware to provide a copy of the vaccination record if obtained from local pharmacy or Health Dept. Verbalized acceptance and understanding.  Flu Vaccine status: Up to date  Pneumococcal vaccine status: Up to date  Covid-19 vaccine status: Completed vaccines  Qualifies for Shingles Vaccine? Yes   Zostavax completed No   Shingrix Completed?: Yes  Screening Tests Health Maintenance  Topic Date Due   TETANUS/TDAP  10/13/2021 (Originally 01/04/1965)   DEXA SCAN  11/16/2020   INFLUENZA VACCINE  11/20/2020   COVID-19 Vaccine (4 - Booster for Jagual series) 01/04/2021   COLONOSCOPY (Pts 45-59yr Insurance coverage will need to be confirmed)  02/02/2024   Hepatitis C Screening  Completed   PNA vac Low Risk Adult  Completed   HPV VACCINES   Aged Out   Zoster Vaccines- Shingrix  Discontinued    Health Maintenance  There are no preventive care reminders to display for this patient.  Colorectal cancer screening: Type  of screening: Colonoscopy. Completed 02/02/2019. Repeat every 5 years  Mammogram status: Completed 10/28/2019. Repeat every year  Bone Density status: Completed 11/17/2018. Results reflect: Bone density results: OSTEOPENIA. Repeat every 2 years.  Lung Cancer Screening: (Low Dose CT Chest recommended if Age 34-80 years, 30 pack-year currently smoking OR have quit w/in 15years.) does not qualify.   Lung Cancer Screening Referral: no  Additional Screening:  Hepatitis C Screening: does qualify; Completed yes  Vision Screening: Recommended annual ophthalmology exams for early detection of glaucoma and other disorders of the eye. Is the patient up to date with their annual eye exam?  Yes  Who is the provider or what is the name of the office in which the patient attends annual eye exams? [facility] If pt is not established with a provider, would they like to be referred to a provider to establish care? No .   Dental Screening: Recommended annual dental exams for proper oral hygiene  Community Resource Referral / Chronic Care Management: CRR required this visit?  No   CCM required this visit?  No      Plan:     I have personally reviewed and noted the following in the patient's chart:   Medical and social history Use of alcohol, tobacco or illicit drugs  Current medications and supplements including opioid prescriptions.  Functional ability and status Nutritional status Physical activity Advanced directives List of other physicians Hospitalizations, surgeries, and ER visits in previous 12 months Vitals Screenings to include cognitive, depression, and falls Referrals and appointments  In addition, I have reviewed and discussed with patient certain preventive protocols, quality  metrics, and best practice recommendations. A written personalized care plan for preventive services as well as general preventive health recommendations were provided to patient.     Sheral Flow, LPN   [id number]   Nurse Notes:  Patient is cogitatively intact. There were no vitals filed for this visit. There is no height or weight on file to calculate BMI. Patient stated that she has no issues with gait or balance; does not use any assistive devices.

## 2020-11-13 NOTE — Telephone Encounter (Signed)
Patient called at 8:45 regarding her 8:15 wellness visit that she had missed requesting to be called back. Wellness coach notified via teams but is with another patient at the moment.

## 2020-11-28 ENCOUNTER — Other Ambulatory Visit: Payer: Self-pay

## 2020-11-28 ENCOUNTER — Ambulatory Visit
Admission: RE | Admit: 2020-11-28 | Discharge: 2020-11-28 | Disposition: A | Discharge status: Home / Self Care | Payer: Medicare HMO | Source: Ambulatory Visit | Attending: Internal Medicine | Admitting: Internal Medicine

## 2020-11-28 DIAGNOSIS — Z1231 Encounter for screening mammogram for malignant neoplasm of breast: Secondary | ICD-10-CM

## 2020-11-29 DIAGNOSIS — M9902 Segmental and somatic dysfunction of thoracic region: Secondary | ICD-10-CM | POA: Diagnosis not present

## 2020-11-29 DIAGNOSIS — M5386 Other specified dorsopathies, lumbar region: Secondary | ICD-10-CM | POA: Diagnosis not present

## 2020-11-29 DIAGNOSIS — M9903 Segmental and somatic dysfunction of lumbar region: Secondary | ICD-10-CM | POA: Diagnosis not present

## 2020-11-29 DIAGNOSIS — M9905 Segmental and somatic dysfunction of pelvic region: Secondary | ICD-10-CM | POA: Diagnosis not present

## 2020-11-29 DIAGNOSIS — M5414 Radiculopathy, thoracic region: Secondary | ICD-10-CM | POA: Diagnosis not present

## 2020-11-29 DIAGNOSIS — S338XXA Sprain of other parts of lumbar spine and pelvis, initial encounter: Secondary | ICD-10-CM | POA: Diagnosis not present

## 2020-11-30 DIAGNOSIS — H26493 Other secondary cataract, bilateral: Secondary | ICD-10-CM | POA: Diagnosis not present

## 2020-12-06 ENCOUNTER — Other Ambulatory Visit: Payer: Self-pay | Admitting: Internal Medicine

## 2020-12-12 DIAGNOSIS — Z87892 Personal history of anaphylaxis: Secondary | ICD-10-CM | POA: Diagnosis not present

## 2020-12-12 DIAGNOSIS — Z800 Family history of malignant neoplasm of digestive organs: Secondary | ICD-10-CM | POA: Diagnosis not present

## 2020-12-12 DIAGNOSIS — I10 Essential (primary) hypertension: Secondary | ICD-10-CM | POA: Diagnosis not present

## 2020-12-12 DIAGNOSIS — Z91038 Other insect allergy status: Secondary | ICD-10-CM | POA: Diagnosis not present

## 2020-12-12 DIAGNOSIS — Z8249 Family history of ischemic heart disease and other diseases of the circulatory system: Secondary | ICD-10-CM | POA: Diagnosis not present

## 2020-12-14 ENCOUNTER — Telehealth: Payer: Self-pay | Admitting: Internal Medicine

## 2020-12-14 ENCOUNTER — Other Ambulatory Visit: Payer: Self-pay

## 2020-12-14 MED ORDER — EPINEPHRINE 0.3 MG/0.3ML IJ SOAJ: 0.3000 mg | INTRAMUSCULAR | 0 refills | Status: DC | PRN

## 2020-12-14 NOTE — Telephone Encounter (Signed)
Sent in today 

## 2020-12-14 NOTE — Telephone Encounter (Signed)
1.Medication Requested:EPINEPHrine 0.3 mg/0.3 mL IJ SOAJ injection  2. Pharmacy (Name, [facility]): [id number] - [place], [place] - [place]  Phone:  [phone] Fax:  [phone]   3. On Med List: yes  4. Last Visit with PCP: 06.24.22  5. Next visit date with PCP: n/a   Agent: Please be advised that RX refills may take up to 3 business days. We ask that you follow-up with your pharmacy.

## 2020-12-20 DIAGNOSIS — M9903 Segmental and somatic dysfunction of lumbar region: Secondary | ICD-10-CM | POA: Diagnosis not present

## 2020-12-20 DIAGNOSIS — M5414 Radiculopathy, thoracic region: Secondary | ICD-10-CM | POA: Diagnosis not present

## 2020-12-20 DIAGNOSIS — M9902 Segmental and somatic dysfunction of thoracic region: Secondary | ICD-10-CM | POA: Diagnosis not present

## 2020-12-20 DIAGNOSIS — S338XXA Sprain of other parts of lumbar spine and pelvis, initial encounter: Secondary | ICD-10-CM | POA: Diagnosis not present

## 2020-12-20 DIAGNOSIS — M9905 Segmental and somatic dysfunction of pelvic region: Secondary | ICD-10-CM | POA: Diagnosis not present

## 2020-12-20 DIAGNOSIS — M5386 Other specified dorsopathies, lumbar region: Secondary | ICD-10-CM | POA: Diagnosis not present

## 2021-01-10 DIAGNOSIS — S338XXA Sprain of other parts of lumbar spine and pelvis, initial encounter: Secondary | ICD-10-CM | POA: Diagnosis not present

## 2021-01-10 DIAGNOSIS — M9903 Segmental and somatic dysfunction of lumbar region: Secondary | ICD-10-CM | POA: Diagnosis not present

## 2021-01-10 DIAGNOSIS — M9905 Segmental and somatic dysfunction of pelvic region: Secondary | ICD-10-CM | POA: Diagnosis not present

## 2021-01-10 DIAGNOSIS — M5386 Other specified dorsopathies, lumbar region: Secondary | ICD-10-CM | POA: Diagnosis not present

## 2021-01-10 DIAGNOSIS — M5414 Radiculopathy, thoracic region: Secondary | ICD-10-CM | POA: Diagnosis not present

## 2021-01-10 DIAGNOSIS — M9902 Segmental and somatic dysfunction of thoracic region: Secondary | ICD-10-CM | POA: Diagnosis not present

## 2021-02-14 DIAGNOSIS — M9905 Segmental and somatic dysfunction of pelvic region: Secondary | ICD-10-CM | POA: Diagnosis not present

## 2021-02-14 DIAGNOSIS — M9903 Segmental and somatic dysfunction of lumbar region: Secondary | ICD-10-CM | POA: Diagnosis not present

## 2021-02-14 DIAGNOSIS — M5414 Radiculopathy, thoracic region: Secondary | ICD-10-CM | POA: Diagnosis not present

## 2021-02-14 DIAGNOSIS — S338XXA Sprain of other parts of lumbar spine and pelvis, initial encounter: Secondary | ICD-10-CM | POA: Diagnosis not present

## 2021-02-14 DIAGNOSIS — M9902 Segmental and somatic dysfunction of thoracic region: Secondary | ICD-10-CM | POA: Diagnosis not present

## 2021-02-14 DIAGNOSIS — M5386 Other specified dorsopathies, lumbar region: Secondary | ICD-10-CM | POA: Diagnosis not present

## 2021-03-07 DIAGNOSIS — S338XXA Sprain of other parts of lumbar spine and pelvis, initial encounter: Secondary | ICD-10-CM | POA: Diagnosis not present

## 2021-03-07 DIAGNOSIS — M9905 Segmental and somatic dysfunction of pelvic region: Secondary | ICD-10-CM | POA: Diagnosis not present

## 2021-03-07 DIAGNOSIS — M9903 Segmental and somatic dysfunction of lumbar region: Secondary | ICD-10-CM | POA: Diagnosis not present

## 2021-03-07 DIAGNOSIS — M5414 Radiculopathy, thoracic region: Secondary | ICD-10-CM | POA: Diagnosis not present

## 2021-03-07 DIAGNOSIS — M9902 Segmental and somatic dysfunction of thoracic region: Secondary | ICD-10-CM | POA: Diagnosis not present

## 2021-03-07 DIAGNOSIS — M5386 Other specified dorsopathies, lumbar region: Secondary | ICD-10-CM | POA: Diagnosis not present

## 2021-03-30 ENCOUNTER — Ambulatory Visit
Admission: RE | Admit: 2021-03-30 | Discharge: 2021-03-30 | Disposition: A | Discharge status: Home / Self Care | Payer: Medicare HMO | Source: Ambulatory Visit | Attending: Internal Medicine | Admitting: Internal Medicine

## 2021-03-30 DIAGNOSIS — M8589 Other specified disorders of bone density and structure, multiple sites: Secondary | ICD-10-CM | POA: Diagnosis not present

## 2021-03-30 DIAGNOSIS — Z780 Asymptomatic menopausal state: Secondary | ICD-10-CM | POA: Diagnosis not present

## 2021-03-30 DIAGNOSIS — M85852 Other specified disorders of bone density and structure, left thigh: Secondary | ICD-10-CM

## 2021-04-04 ENCOUNTER — Ambulatory Visit (INDEPENDENT_AMBULATORY_CARE_PROVIDER_SITE_OTHER): Payer: Medicare HMO | Admitting: Internal Medicine

## 2021-04-04 ENCOUNTER — Encounter: Payer: Self-pay | Admitting: Internal Medicine

## 2021-04-04 ENCOUNTER — Other Ambulatory Visit: Payer: Self-pay

## 2021-04-04 VITALS — BP 118/80 | HR 62 | Temp 97.9°F | Ht 64.0 in | Wt 111.0 lb

## 2021-04-04 DIAGNOSIS — M9905 Segmental and somatic dysfunction of pelvic region: Secondary | ICD-10-CM | POA: Diagnosis not present

## 2021-04-04 DIAGNOSIS — G8929 Other chronic pain: Secondary | ICD-10-CM | POA: Diagnosis not present

## 2021-04-04 DIAGNOSIS — M9903 Segmental and somatic dysfunction of lumbar region: Secondary | ICD-10-CM | POA: Diagnosis not present

## 2021-04-04 DIAGNOSIS — M85852 Other specified disorders of bone density and structure, left thigh: Secondary | ICD-10-CM | POA: Diagnosis not present

## 2021-04-04 DIAGNOSIS — M5386 Other specified dorsopathies, lumbar region: Secondary | ICD-10-CM | POA: Diagnosis not present

## 2021-04-04 DIAGNOSIS — S338XXA Sprain of other parts of lumbar spine and pelvis, initial encounter: Secondary | ICD-10-CM | POA: Diagnosis not present

## 2021-04-04 DIAGNOSIS — M9902 Segmental and somatic dysfunction of thoracic region: Secondary | ICD-10-CM | POA: Diagnosis not present

## 2021-04-04 DIAGNOSIS — M545 Low back pain, unspecified: Secondary | ICD-10-CM | POA: Diagnosis not present

## 2021-04-04 DIAGNOSIS — M5414 Radiculopathy, thoracic region: Secondary | ICD-10-CM | POA: Diagnosis not present

## 2021-04-04 MED ORDER — MELOXICAM 7.5 MG PO TABS: 7.5000 mg | ORAL_TABLET | Freq: Every day | ORAL | 3 refills | Status: DC

## 2021-04-04 NOTE — Patient Instructions (Addendum)
° ° ° °  Add in vitamin K for your osteoporosis - about 150 mcg daily    Medications changes include :   meloxicam 7.5 mg daily

## 2021-04-04 NOTE — Assessment & Plan Note (Signed)
Chronic Reviewed recent bone density-there has been a decrease in bone density and she has an elevated for her hip She is taking calcium and vitamin D daily and her vitamin D level has been in a good range the past 2 years Advised adding vitamin K proximately 125 mcg daily Can add weights for upper extremities She walks daily and is very active She is a low fall risk-discussed fall prevention Discussed treatment osteoporosis-she was on a bisphosphonate in the past and did have a reaction.  Discussed other options We both agree we would like to hold off on any additional treatment.  Get bone density in 2 years and reevaluate at that time

## 2021-04-04 NOTE — Assessment & Plan Note (Signed)
Chronic Managed with regular chiropractor visits and massages Has been taking Aleve over-the-counter, which worked well Would like to try meloxicam which she recently tried and tolerated well and it did work well Start meloxicam 7.5 mg daily-advised to take with food.  Discussed possible side effects of GERD, stomach ulcers, chronic kidney problems She will keep to a minimum and if able to take as needed Advised blood work once a year, sooner if any side effects

## 2021-04-04 NOTE — Progress Notes (Signed)
Subjective:    Patient ID: Margaret Craig, female    DOB: [birth year], 75 y.o.   MRN: [id number]  This visit occurred during the SARS-CoV-2 public health emergency.  Safety protocols were in place, including screening questions prior to the visit, additional usage of staff PPE, and extensive cleaning of exam room while observing appropriate contact time as indicated for disinfecting solutions.    HPI The patient is here for an acute visit.  She walks 3-4 miles a day and is very active.  She is taking her calcium and vitamin D daily.  She did want to review the bone density today.  She has chronic lower back pain.  She sees a chiropractor regularly and also does some massage on a regular basis.  She typically takes over-the-counter Aleve.  She recently tried a friend's meloxicam and was wondering if this is something she can try because it did seem to help better than the Aleve.   Medications and allergies reviewed with patient and updated if appropriate.  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     Calcium Carbonate-Vitamin D 600-200 MG-UNIT TABS Take by mouth.     cholecalciferol (VITAMIN D3) 25 MCG (1000 UNIT) tablet Take 1,000 Units by mouth daily.     EPINEPHrine 0.3 mg/0.3 mL IJ SOAJ injection Inject 0.3 mg into the muscle as needed for anaphylaxis. 1 each 0   lisinopril (ZESTRIL) 5 MG tablet Take 0.5 tablets (2.5 mg total) by mouth daily. 45 tablet 3   Multiple Vitamins-Minerals (MULTIVITAMIN WITH MINERALS) tablet Take 1 tablet by mouth daily.     Multiple Vitamins-Minerals  (ZINC PO) Take by mouth.     No current facility-administered medications on file prior to visit.    Past Medical History:  Diagnosis Date   Allergy    Arthritis    Fainting spell    Hypertension    Thyroid disease    UTI (urinary tract infection)     Past Surgical History:  Procedure Laterality Date   COLONOSCOPY     SINUS IRRIGATION     TONSILLECTOMY      Social History   Socioeconomic History   Marital status: Married    Spouse name: Not on file   Number of children: Not on file   Years of education: Not on file   Highest education level: Not on file  Occupational History   Not on file  Tobacco Use   Smoking status: Never   Smokeless tobacco: Never  Vaping Use   Vaping Use: Never used  Substance and Sexual Activity   Alcohol use: Yes    Alcohol/week: 6.0 standard drinks    Types: 6 Standard drinks or equivalent per week   Drug use: Never   Sexual activity: Not on file  Other Topics Concern   Not on file  Social History Narrative   Not on file   Social Determinants of Health   Financial Resource Strain: Low Risk    Difficulty of Paying Living Expenses: Not hard at all  Food Insecurity: No Food Insecurity   Worried About  Running Out of Food in the Last Year: Never true   Ran Out of Food in the Last Year: Never true  Transportation Needs: No Transportation Needs   Lack of Transportation (Medical): No   Lack of Transportation (Non-Medical): No  Physical Activity: Sufficiently Active   Days of Exercise per Week: 7 days   Minutes of Exercise per Session: 30 min  Stress: No Stress Concern Present   Feeling of Stress : Not at all  Social Connections: Moderately Integrated   Frequency of Communication with Friends and Family: More than three times a week   Frequency of Social Gatherings with Friends and Family: More than three times a week   Attends Religious Services: More than 4 times per year   Active Member of [employer] or Organizations: Yes   Attends  Music therapist: More than 4 times per year   Marital Status: Never married    Family History  Problem Relation Age of Onset   Mental illness Mother    Colon cancer Mother 57   Alcohol abuse Son    Mental illness Son    Cancer Maternal Aunt        breast   Heart disease Maternal Aunt    Colon cancer Maternal Aunt        diagnosed in her 1s   Heart disease Paternal Aunt    Alcohol abuse Paternal Uncle    Cancer Maternal Grandmother        breast   Colon cancer Maternal Grandmother    Heart disease Maternal Grandfather    Esophageal cancer Neg Hx    Stomach cancer Neg Hx    Rectal cancer Neg Hx     Review of Systems     Objective:   Vitals:   04/04/21 1350  BP: 118/80  Pulse: 62  Temp: 97.9 F (36.6 C)  SpO2: 97%   BP Readings from Last 3 Encounters:  04/04/21 118/80  10/13/20 110/70  12/15/19 116/72   Wt Readings from Last 3 Encounters:  04/04/21 111 lb (50.3 kg)  10/13/20 110 lb (49.9 kg)  12/15/19 110 lb (49.9 kg)   Body mass index is 19.05 kg/m.   Physical Exam         Assessment & Plan:    See Problem List for Assessment and Plan of chronic medical problems.

## 2021-05-02 DIAGNOSIS — M9902 Segmental and somatic dysfunction of thoracic region: Secondary | ICD-10-CM | POA: Diagnosis not present

## 2021-05-02 DIAGNOSIS — M9905 Segmental and somatic dysfunction of pelvic region: Secondary | ICD-10-CM | POA: Diagnosis not present

## 2021-05-02 DIAGNOSIS — M5414 Radiculopathy, thoracic region: Secondary | ICD-10-CM | POA: Diagnosis not present

## 2021-05-02 DIAGNOSIS — M5386 Other specified dorsopathies, lumbar region: Secondary | ICD-10-CM | POA: Diagnosis not present

## 2021-05-02 DIAGNOSIS — M9903 Segmental and somatic dysfunction of lumbar region: Secondary | ICD-10-CM | POA: Diagnosis not present

## 2021-05-02 DIAGNOSIS — S338XXA Sprain of other parts of lumbar spine and pelvis, initial encounter: Secondary | ICD-10-CM | POA: Diagnosis not present

## 2021-05-23 DIAGNOSIS — M9905 Segmental and somatic dysfunction of pelvic region: Secondary | ICD-10-CM | POA: Diagnosis not present

## 2021-05-23 DIAGNOSIS — M9902 Segmental and somatic dysfunction of thoracic region: Secondary | ICD-10-CM | POA: Diagnosis not present

## 2021-05-23 DIAGNOSIS — M5414 Radiculopathy, thoracic region: Secondary | ICD-10-CM | POA: Diagnosis not present

## 2021-05-23 DIAGNOSIS — S338XXA Sprain of other parts of lumbar spine and pelvis, initial encounter: Secondary | ICD-10-CM | POA: Diagnosis not present

## 2021-05-23 DIAGNOSIS — M5386 Other specified dorsopathies, lumbar region: Secondary | ICD-10-CM | POA: Diagnosis not present

## 2021-05-23 DIAGNOSIS — M9903 Segmental and somatic dysfunction of lumbar region: Secondary | ICD-10-CM | POA: Diagnosis not present

## 2021-05-29 DIAGNOSIS — M545 Low back pain, unspecified: Secondary | ICD-10-CM | POA: Diagnosis not present

## 2021-05-29 DIAGNOSIS — M25552 Pain in left hip: Secondary | ICD-10-CM | POA: Diagnosis not present

## 2021-06-07 DIAGNOSIS — M25552 Pain in left hip: Secondary | ICD-10-CM | POA: Diagnosis not present

## 2021-06-12 DIAGNOSIS — M5442 Lumbago with sciatica, left side: Secondary | ICD-10-CM | POA: Diagnosis not present

## 2021-06-19 DIAGNOSIS — M545 Low back pain, unspecified: Secondary | ICD-10-CM | POA: Diagnosis not present

## 2021-06-20 DIAGNOSIS — M9905 Segmental and somatic dysfunction of pelvic region: Secondary | ICD-10-CM | POA: Diagnosis not present

## 2021-06-20 DIAGNOSIS — M5414 Radiculopathy, thoracic region: Secondary | ICD-10-CM | POA: Diagnosis not present

## 2021-06-20 DIAGNOSIS — M9902 Segmental and somatic dysfunction of thoracic region: Secondary | ICD-10-CM | POA: Diagnosis not present

## 2021-06-20 DIAGNOSIS — M9903 Segmental and somatic dysfunction of lumbar region: Secondary | ICD-10-CM | POA: Diagnosis not present

## 2021-06-20 DIAGNOSIS — M5386 Other specified dorsopathies, lumbar region: Secondary | ICD-10-CM | POA: Diagnosis not present

## 2021-06-20 DIAGNOSIS — S338XXA Sprain of other parts of lumbar spine and pelvis, initial encounter: Secondary | ICD-10-CM | POA: Diagnosis not present

## 2021-06-21 DIAGNOSIS — L570 Actinic keratosis: Secondary | ICD-10-CM | POA: Diagnosis not present

## 2021-06-21 DIAGNOSIS — D044 Carcinoma in situ of skin of scalp and neck: Secondary | ICD-10-CM | POA: Diagnosis not present

## 2021-06-21 DIAGNOSIS — D485 Neoplasm of uncertain behavior of skin: Secondary | ICD-10-CM | POA: Diagnosis not present

## 2021-06-21 DIAGNOSIS — L821 Other seborrheic keratosis: Secondary | ICD-10-CM | POA: Diagnosis not present

## 2021-06-27 DIAGNOSIS — M545 Low back pain, unspecified: Secondary | ICD-10-CM | POA: Diagnosis not present

## 2021-07-05 DIAGNOSIS — D044 Carcinoma in situ of skin of scalp and neck: Secondary | ICD-10-CM | POA: Diagnosis not present

## 2021-07-05 DIAGNOSIS — L570 Actinic keratosis: Secondary | ICD-10-CM | POA: Diagnosis not present

## 2021-07-13 DIAGNOSIS — M5416 Radiculopathy, lumbar region: Secondary | ICD-10-CM | POA: Diagnosis not present

## 2021-07-16 DIAGNOSIS — M9905 Segmental and somatic dysfunction of pelvic region: Secondary | ICD-10-CM | POA: Diagnosis not present

## 2021-07-16 DIAGNOSIS — M5414 Radiculopathy, thoracic region: Secondary | ICD-10-CM | POA: Diagnosis not present

## 2021-07-16 DIAGNOSIS — M9903 Segmental and somatic dysfunction of lumbar region: Secondary | ICD-10-CM | POA: Diagnosis not present

## 2021-07-16 DIAGNOSIS — M5386 Other specified dorsopathies, lumbar region: Secondary | ICD-10-CM | POA: Diagnosis not present

## 2021-07-16 DIAGNOSIS — S338XXA Sprain of other parts of lumbar spine and pelvis, initial encounter: Secondary | ICD-10-CM | POA: Diagnosis not present

## 2021-07-16 DIAGNOSIS — M9902 Segmental and somatic dysfunction of thoracic region: Secondary | ICD-10-CM | POA: Diagnosis not present

## 2021-07-17 DIAGNOSIS — M5416 Radiculopathy, lumbar region: Secondary | ICD-10-CM | POA: Diagnosis not present

## 2021-07-31 DIAGNOSIS — M5416 Radiculopathy, lumbar region: Secondary | ICD-10-CM | POA: Diagnosis not present

## 2021-08-15 DIAGNOSIS — M9903 Segmental and somatic dysfunction of lumbar region: Secondary | ICD-10-CM | POA: Diagnosis not present

## 2021-08-15 DIAGNOSIS — M9902 Segmental and somatic dysfunction of thoracic region: Secondary | ICD-10-CM | POA: Diagnosis not present

## 2021-08-15 DIAGNOSIS — S338XXA Sprain of other parts of lumbar spine and pelvis, initial encounter: Secondary | ICD-10-CM | POA: Diagnosis not present

## 2021-08-15 DIAGNOSIS — M9905 Segmental and somatic dysfunction of pelvic region: Secondary | ICD-10-CM | POA: Diagnosis not present

## 2021-08-15 DIAGNOSIS — M5386 Other specified dorsopathies, lumbar region: Secondary | ICD-10-CM | POA: Diagnosis not present

## 2021-08-15 DIAGNOSIS — M5414 Radiculopathy, thoracic region: Secondary | ICD-10-CM | POA: Diagnosis not present

## 2021-08-20 DIAGNOSIS — M5414 Radiculopathy, thoracic region: Secondary | ICD-10-CM | POA: Diagnosis not present

## 2021-08-20 DIAGNOSIS — M5416 Radiculopathy, lumbar region: Secondary | ICD-10-CM | POA: Diagnosis not present

## 2021-08-20 DIAGNOSIS — M9903 Segmental and somatic dysfunction of lumbar region: Secondary | ICD-10-CM | POA: Diagnosis not present

## 2021-08-20 DIAGNOSIS — S338XXA Sprain of other parts of lumbar spine and pelvis, initial encounter: Secondary | ICD-10-CM | POA: Diagnosis not present

## 2021-08-20 DIAGNOSIS — M5386 Other specified dorsopathies, lumbar region: Secondary | ICD-10-CM | POA: Diagnosis not present

## 2021-08-20 DIAGNOSIS — M9905 Segmental and somatic dysfunction of pelvic region: Secondary | ICD-10-CM | POA: Diagnosis not present

## 2021-08-20 DIAGNOSIS — M9902 Segmental and somatic dysfunction of thoracic region: Secondary | ICD-10-CM | POA: Diagnosis not present

## 2021-08-21 ENCOUNTER — Telehealth: Payer: Self-pay | Admitting: Internal Medicine

## 2021-08-21 DIAGNOSIS — Z0000 Encounter for general adult medical examination without abnormal findings: Secondary | ICD-10-CM

## 2021-08-21 DIAGNOSIS — E0590 Thyrotoxicosis, unspecified without thyrotoxic crisis or storm: Secondary | ICD-10-CM

## 2021-08-21 DIAGNOSIS — M85852 Other specified disorders of bone density and structure, left thigh: Secondary | ICD-10-CM

## 2021-08-21 DIAGNOSIS — I10 Essential (primary) hypertension: Secondary | ICD-10-CM

## 2021-08-21 NOTE — Telephone Encounter (Signed)
Blood work ordered.

## 2021-08-21 NOTE — Telephone Encounter (Signed)
Pt requesting that lab work be completed prior to scheduled physical appt.  ? ?Pt is requesting that labs also include a thyroid check.  ?

## 2021-08-22 DIAGNOSIS — M9902 Segmental and somatic dysfunction of thoracic region: Secondary | ICD-10-CM | POA: Diagnosis not present

## 2021-08-22 DIAGNOSIS — S338XXA Sprain of other parts of lumbar spine and pelvis, initial encounter: Secondary | ICD-10-CM | POA: Diagnosis not present

## 2021-08-22 DIAGNOSIS — M5414 Radiculopathy, thoracic region: Secondary | ICD-10-CM | POA: Diagnosis not present

## 2021-08-22 DIAGNOSIS — M9903 Segmental and somatic dysfunction of lumbar region: Secondary | ICD-10-CM | POA: Diagnosis not present

## 2021-08-22 DIAGNOSIS — M9905 Segmental and somatic dysfunction of pelvic region: Secondary | ICD-10-CM | POA: Diagnosis not present

## 2021-08-22 DIAGNOSIS — M5386 Other specified dorsopathies, lumbar region: Secondary | ICD-10-CM | POA: Diagnosis not present

## 2021-08-22 NOTE — Telephone Encounter (Signed)
My-chart message sent to patient to let me know when she was available for lab appointment. ?

## 2021-08-27 DIAGNOSIS — S338XXA Sprain of other parts of lumbar spine and pelvis, initial encounter: Secondary | ICD-10-CM | POA: Diagnosis not present

## 2021-08-27 DIAGNOSIS — M9903 Segmental and somatic dysfunction of lumbar region: Secondary | ICD-10-CM | POA: Diagnosis not present

## 2021-08-27 DIAGNOSIS — M9902 Segmental and somatic dysfunction of thoracic region: Secondary | ICD-10-CM | POA: Diagnosis not present

## 2021-08-27 DIAGNOSIS — M5414 Radiculopathy, thoracic region: Secondary | ICD-10-CM | POA: Diagnosis not present

## 2021-08-27 DIAGNOSIS — M5386 Other specified dorsopathies, lumbar region: Secondary | ICD-10-CM | POA: Diagnosis not present

## 2021-08-27 DIAGNOSIS — M9905 Segmental and somatic dysfunction of pelvic region: Secondary | ICD-10-CM | POA: Diagnosis not present

## 2021-09-03 DIAGNOSIS — M9905 Segmental and somatic dysfunction of pelvic region: Secondary | ICD-10-CM | POA: Diagnosis not present

## 2021-09-03 DIAGNOSIS — M5414 Radiculopathy, thoracic region: Secondary | ICD-10-CM | POA: Diagnosis not present

## 2021-09-03 DIAGNOSIS — M9903 Segmental and somatic dysfunction of lumbar region: Secondary | ICD-10-CM | POA: Diagnosis not present

## 2021-09-03 DIAGNOSIS — M5386 Other specified dorsopathies, lumbar region: Secondary | ICD-10-CM | POA: Diagnosis not present

## 2021-09-03 DIAGNOSIS — S338XXA Sprain of other parts of lumbar spine and pelvis, initial encounter: Secondary | ICD-10-CM | POA: Diagnosis not present

## 2021-09-03 DIAGNOSIS — M9902 Segmental and somatic dysfunction of thoracic region: Secondary | ICD-10-CM | POA: Diagnosis not present

## 2021-09-05 DIAGNOSIS — M9902 Segmental and somatic dysfunction of thoracic region: Secondary | ICD-10-CM | POA: Diagnosis not present

## 2021-09-05 DIAGNOSIS — M9903 Segmental and somatic dysfunction of lumbar region: Secondary | ICD-10-CM | POA: Diagnosis not present

## 2021-09-05 DIAGNOSIS — M9905 Segmental and somatic dysfunction of pelvic region: Secondary | ICD-10-CM | POA: Diagnosis not present

## 2021-09-05 DIAGNOSIS — S338XXA Sprain of other parts of lumbar spine and pelvis, initial encounter: Secondary | ICD-10-CM | POA: Diagnosis not present

## 2021-09-05 DIAGNOSIS — M5386 Other specified dorsopathies, lumbar region: Secondary | ICD-10-CM | POA: Diagnosis not present

## 2021-09-05 DIAGNOSIS — M5414 Radiculopathy, thoracic region: Secondary | ICD-10-CM | POA: Diagnosis not present

## 2021-09-18 DIAGNOSIS — M9905 Segmental and somatic dysfunction of pelvic region: Secondary | ICD-10-CM | POA: Diagnosis not present

## 2021-09-18 DIAGNOSIS — M5414 Radiculopathy, thoracic region: Secondary | ICD-10-CM | POA: Diagnosis not present

## 2021-09-18 DIAGNOSIS — S338XXA Sprain of other parts of lumbar spine and pelvis, initial encounter: Secondary | ICD-10-CM | POA: Diagnosis not present

## 2021-09-18 DIAGNOSIS — M9903 Segmental and somatic dysfunction of lumbar region: Secondary | ICD-10-CM | POA: Diagnosis not present

## 2021-09-18 DIAGNOSIS — M5386 Other specified dorsopathies, lumbar region: Secondary | ICD-10-CM | POA: Diagnosis not present

## 2021-09-18 DIAGNOSIS — M9902 Segmental and somatic dysfunction of thoracic region: Secondary | ICD-10-CM | POA: Diagnosis not present

## 2021-09-25 DIAGNOSIS — M5416 Radiculopathy, lumbar region: Secondary | ICD-10-CM | POA: Diagnosis not present

## 2021-10-02 DIAGNOSIS — M9905 Segmental and somatic dysfunction of pelvic region: Secondary | ICD-10-CM | POA: Diagnosis not present

## 2021-10-02 DIAGNOSIS — M9902 Segmental and somatic dysfunction of thoracic region: Secondary | ICD-10-CM | POA: Diagnosis not present

## 2021-10-02 DIAGNOSIS — S338XXA Sprain of other parts of lumbar spine and pelvis, initial encounter: Secondary | ICD-10-CM | POA: Diagnosis not present

## 2021-10-02 DIAGNOSIS — M5386 Other specified dorsopathies, lumbar region: Secondary | ICD-10-CM | POA: Diagnosis not present

## 2021-10-02 DIAGNOSIS — M9903 Segmental and somatic dysfunction of lumbar region: Secondary | ICD-10-CM | POA: Diagnosis not present

## 2021-10-02 DIAGNOSIS — M5414 Radiculopathy, thoracic region: Secondary | ICD-10-CM | POA: Diagnosis not present

## 2021-10-04 ENCOUNTER — Other Ambulatory Visit: Payer: Self-pay | Admitting: Internal Medicine

## 2021-10-09 DIAGNOSIS — S338XXA Sprain of other parts of lumbar spine and pelvis, initial encounter: Secondary | ICD-10-CM | POA: Diagnosis not present

## 2021-10-09 DIAGNOSIS — M9905 Segmental and somatic dysfunction of pelvic region: Secondary | ICD-10-CM | POA: Diagnosis not present

## 2021-10-09 DIAGNOSIS — M9902 Segmental and somatic dysfunction of thoracic region: Secondary | ICD-10-CM | POA: Diagnosis not present

## 2021-10-09 DIAGNOSIS — M5414 Radiculopathy, thoracic region: Secondary | ICD-10-CM | POA: Diagnosis not present

## 2021-10-09 DIAGNOSIS — M5386 Other specified dorsopathies, lumbar region: Secondary | ICD-10-CM | POA: Diagnosis not present

## 2021-10-09 DIAGNOSIS — M9903 Segmental and somatic dysfunction of lumbar region: Secondary | ICD-10-CM | POA: Diagnosis not present

## 2021-10-10 ENCOUNTER — Other Ambulatory Visit (INDEPENDENT_AMBULATORY_CARE_PROVIDER_SITE_OTHER): Payer: Medicare HMO

## 2021-10-10 DIAGNOSIS — E0590 Thyrotoxicosis, unspecified without thyrotoxic crisis or storm: Secondary | ICD-10-CM

## 2021-10-10 DIAGNOSIS — Z0000 Encounter for general adult medical examination without abnormal findings: Secondary | ICD-10-CM | POA: Diagnosis not present

## 2021-10-10 DIAGNOSIS — M85852 Other specified disorders of bone density and structure, left thigh: Secondary | ICD-10-CM

## 2021-10-10 DIAGNOSIS — I10 Essential (primary) hypertension: Secondary | ICD-10-CM | POA: Diagnosis not present

## 2021-10-10 LAB — CBC WITH DIFFERENTIAL/PLATELET
Basophils Absolute: 0 10*3/uL (ref 0.0–0.1)
Basophils Relative: 0.9 % (ref 0.0–3.0)
Eosinophils Absolute: 0.1 10*3/uL (ref 0.0–0.7)
Eosinophils Relative: 3.2 % (ref 0.0–5.0)
HCT: 40.5 % (ref 36.0–46.0)
Hemoglobin: 13.7 g/dL (ref 12.0–15.0)
Lymphocytes Relative: 29.2 % (ref 12.0–46.0)
Lymphs Abs: 1.3 10*3/uL (ref 0.7–4.0)
MCHC: 33.8 g/dL (ref 30.0–36.0)
MCV: 99.4 fl (ref 78.0–100.0)
Monocytes Absolute: 0.4 10*3/uL (ref 0.1–1.0)
Monocytes Relative: 8.2 % (ref 3.0–12.0)
Neutro Abs: 2.5 10*3/uL (ref 1.4–7.7)
Neutrophils Relative %: 58.5 % (ref 43.0–77.0)
Platelets: 222 10*3/uL (ref 150.0–400.0)
RBC: 4.08 Mil/uL (ref 3.87–5.11)
RDW: 12.7 % (ref 11.5–15.5)
WBC: 4.3 10*3/uL (ref 4.0–10.5)

## 2021-10-10 LAB — LIPID PANEL
Cholesterol: 211 mg/dL — ABNORMAL HIGH (ref 0–200)
HDL: 85.5 mg/dL (ref >39.00)
LDL Cholesterol: 111 mg/dL — ABNORMAL HIGH (ref 0–99)
NonHDL: 125.2
Total CHOL/HDL Ratio: 2
Triglycerides: 70 mg/dL (ref 0.0–149.0)
VLDL: 14 mg/dL (ref 0.0–40.0)

## 2021-10-10 LAB — COMPREHENSIVE METABOLIC PANEL
ALT: 15 U/L (ref 0–35)
AST: 17 U/L (ref 0–37)
Albumin: 4.1 g/dL (ref 3.5–5.2)
Alkaline Phosphatase: 49 U/L (ref 39–117)
BUN: 13 mg/dL (ref 6–23)
CO2: 29 mEq/L (ref 19–32)
Calcium: 9.2 mg/dL (ref 8.4–10.5)
Chloride: 101 mEq/L (ref 96–112)
Creatinine, Ser: 0.79 mg/dL (ref 0.40–1.20)
GFR: 72.48 mL/min (ref >60.00)
Glucose, Bld: 86 mg/dL (ref 70–99)
Potassium: 4.3 mEq/L (ref 3.5–5.1)
Sodium: 136 mEq/L (ref 135–145)
Total Bilirubin: 0.5 mg/dL (ref 0.2–1.2)
Total Protein: 6.8 g/dL (ref 6.0–8.3)

## 2021-10-10 LAB — T3, FREE: T3, Free: 3.1 pg/mL (ref 2.3–4.2)

## 2021-10-10 LAB — T4, FREE: Free T4: 0.9 ng/dL (ref 0.60–1.60)

## 2021-10-10 LAB — VITAMIN D 25 HYDROXY (VIT D DEFICIENCY, FRACTURES): VITD: 43.95 ng/mL (ref 30.00–100.00)

## 2021-10-10 LAB — TSH: TSH: 0.14 u[IU]/mL — ABNORMAL LOW (ref 0.35–5.50)

## 2021-10-15 ENCOUNTER — Other Ambulatory Visit: Payer: Self-pay

## 2021-10-16 ENCOUNTER — Encounter: Payer: Self-pay | Admitting: Internal Medicine

## 2021-10-16 NOTE — Progress Notes (Signed)
Subjective:    Patient ID: Margaret Craig, female    DOB: December 07, 1945, 76 y.o.   MRN: [id number]      HPI Margaret Craig is here for a Physical exam.   Still having lower back pain. To have R SI joint injection soon.   Had L SI joint injection in the past and it helped.  This sciatica that she was experiencing did improve after an injection, but she feels like that is coming back.  She is following with orthopedics and has an appointment soon.   Medications and allergies reviewed with patient and updated if appropriate.  Current Outpatient Medications on File Prior to Visit  Medication Sig Dispense Refill   BIOTIN PO Take by mouth.     Calcium Carbonate-Vitamin D 600-200 MG-UNIT TABS Take by mouth.     cholecalciferol (VITAMIN D3) 25 MCG (1000 UNIT) tablet Take 1,000 Units by mouth daily.     EPINEPHrine 0.3 mg/0.3 mL IJ SOAJ injection Inject 0.3 mg into the muscle as needed for anaphylaxis. 1 each 0   meloxicam (MOBIC) 7.5 MG tablet Take 1 tablet (7.5 mg total) by mouth daily. Take with food 90 tablet 3   Multiple Vitamins-Minerals (MULTIVITAMIN WITH MINERALS) tablet Take 1 tablet by mouth daily.     Multiple Vitamins-Minerals (ZINC PO) Take by mouth.     vitamin k 100 MCG tablet Take 100 mcg by mouth daily.     zinc gluconate 50 MG tablet Take 50 mg by mouth daily.     No current facility-administered medications on file prior to visit.    Review of Systems  Constitutional:  Negative for fever.  Eyes:  Negative for visual disturbance.  Respiratory:  Negative for cough, shortness of breath and wheezing.   Cardiovascular:  Negative for chest pain, palpitations and leg swelling.  Gastrointestinal:  Negative for abdominal pain, blood in stool, constipation, diarrhea and nausea.  Genitourinary:  Negative for dysuria.  Musculoskeletal:  Positive for back pain. Negative for arthralgias.  Skin:  Negative for rash.  Neurological:  Negative for light-headedness and headaches.   Psychiatric/Behavioral:  Negative for dysphoric mood. The patient is not nervous/anxious.        Objective:   Vitals:   10/17/21 1004  BP: 118/70  Pulse: (!) 57  Temp: 97.9 F (36.6 C)  SpO2: 98%   Filed Weights   10/17/21 1004  Weight: 108 lb (49 kg)   Body mass index is 18.54 kg/m.  BP Readings from Last 3 Encounters:  10/17/21 118/70  04/04/21 118/80  10/13/20 110/70    Wt Readings from Last 3 Encounters:  10/17/21 108 lb (49 kg)  04/04/21 111 lb (50.3 kg)  10/13/20 110 lb (49.9 kg)       Physical Exam Constitutional: She appears well-developed and well-nourished. No distress.  HENT:  Head: Normocephalic and atraumatic.  Right Ear: External ear normal. Normal ear canal and TM Left Ear: External ear normal.  Normal ear canal and TM Mouth/Throat: Oropharynx is clear and moist.  Eyes: Conjunctivae normal.  Neck: Neck supple. No tracheal deviation present. No thyromegaly present.  No carotid bruit  Cardiovascular: Normal rate, regular rhythm and normal heart sounds.   No murmur heard.  No edema. Pulmonary/Chest: Effort normal and breath sounds normal. No respiratory distress. She has no wheezes. She has no rales.  Breast: deferred   Abdominal: Soft. She exhibits no distension. There is no tenderness.  Lymphadenopathy: She has no cervical adenopathy.  Skin: Skin is warm  and dry. She is not diaphoretic.  Psychiatric: She has a normal mood and affect. Her behavior is normal.     Lab Results  Component Value Date   WBC 4.3 10/10/2021   HGB 13.7 10/10/2021   HCT 40.5 10/10/2021   PLT 222.0 10/10/2021   GLUCOSE 86 10/10/2021   CHOL 211 (H) 10/10/2021   TRIG 70.0 10/10/2021   HDL 85.50 10/10/2021   LDLCALC 111 (H) 10/10/2021   ALT 15 10/10/2021   AST 17 10/10/2021   NA 136 10/10/2021   K 4.3 10/10/2021   CL 101 10/10/2021   CREATININE 0.79 10/10/2021   BUN 13 10/10/2021   CO2 29 10/10/2021   TSH 0.14 (L) 10/10/2021         Assessment & Plan:    Physical exam: Screening blood work  reviewed Exercise  walking Weight  normal Substance abuse  none   Reviewed recommended immunizations.   Health Maintenance  Topic Date Due   COVID-19 Vaccine (4 - Pfizer series) 11/02/2021 (Originally 10/29/2020)   TETANUS/TDAP  10/18/2022 (Originally 01/04/1965)   INFLUENZA VACCINE  11/20/2021   DEXA SCAN  03/31/2023   COLONOSCOPY (Pts 45-55yr Insurance coverage will need to be confirmed)  02/02/2024   Pneumonia Vaccine 77 Years old  Completed   Hepatitis C Screening  Completed   HPV VACCINES  Aged Out   Zoster Vaccines- Shingrix  Discontinued          See Problem List for Assessment and Plan of chronic medical problems.

## 2021-10-16 NOTE — Patient Instructions (Addendum)
Blood work was reviewed.     Medications changes include :   none   Your prescription(s) have been sent to your pharmacy.    Return in about 1 year (around 10/18/2022) for Physical Exam.    Health Maintenance, Female Adopting a healthy lifestyle and getting preventive care are important in promoting health and wellness. Ask your health care provider about: The right schedule for you to have regular tests and exams. Things you can do on your own to prevent diseases and keep yourself healthy. What should I know about diet, weight, and exercise? Eat a healthy diet  Eat a diet that includes plenty of vegetables, fruits, low-fat dairy products, and lean protein. Do not eat a lot of foods that are high in solid fats, added sugars, or sodium. Maintain a healthy weight Body mass index (BMI) is used to identify weight problems. It estimates body fat based on height and weight. Your health care provider can help determine your BMI and help you achieve or maintain a healthy weight. Get regular exercise Get regular exercise. This is one of the most important things you can do for your health. Most adults should: Exercise for at least 150 minutes each week. The exercise should increase your heart rate and make you sweat (moderate-intensity exercise). Do strengthening exercises at least twice a week. This is in addition to the moderate-intensity exercise. Spend less time sitting. Even light physical activity can be beneficial. Watch cholesterol and blood lipids Have your blood tested for lipids and cholesterol at 77 years of age, then have this test every 5 years. Have your cholesterol levels checked more often if: Your lipid or cholesterol levels are high. You are older than 77 years of age. You are at high risk for heart disease. What should I know about cancer screening? Depending on your health history and family history, you may need to have cancer screening at various ages. This  may include screening for: Breast cancer. Cervical cancer. Colorectal cancer. Skin cancer. Lung cancer. What should I know about heart disease, diabetes, and high blood pressure? Blood pressure and heart disease High blood pressure causes heart disease and increases the risk of stroke. This is more likely to develop in people who have high blood pressure readings or are overweight. Have your blood pressure checked: Every 3-5 years if you are 86-100 years of age. Every year if you are 15 years old or older. Diabetes Have regular diabetes screenings. This checks your fasting blood sugar level. Have the screening done: Once every three years after age 37 if you are at a normal weight and have a low risk for diabetes. More often and at a younger age if you are overweight or have a high risk for diabetes. What should I know about preventing infection? Hepatitis B If you have a higher risk for hepatitis B, you should be screened for this virus. Talk with your health care provider to find out if you are at risk for hepatitis B infection. Hepatitis C Testing is recommended for: Everyone born from [birth year] through 1966. Anyone with known risk factors for hepatitis C. Sexually transmitted infections (STIs) Get screened for STIs, including gonorrhea and chlamydia, if: You are sexually active and are younger than 77 years of age. You are older than 77 years of age and your health care provider tells you that you are at risk for this type of infection. Your sexual activity has changed since you were last screened, and you are  at increased risk for chlamydia or gonorrhea. Ask your health care provider if you are at risk. Ask your health care provider about whether you are at high risk for HIV. Your health care provider may recommend a prescription medicine to help prevent HIV infection. If you choose to take medicine to prevent HIV, you should first get tested for HIV. You should then be tested every 3  months for as long as you are taking the medicine. Pregnancy If you are about to stop having your period (premenopausal) and you may become pregnant, seek counseling before you get pregnant. Take 400 to 800 micrograms (mcg) of folic acid every day if you become pregnant. Ask for birth control (contraception) if you want to prevent pregnancy. Osteoporosis and menopause Osteoporosis is a disease in which the bones lose minerals and strength with aging. This can result in bone fractures. If you are 63 years old or older, or if you are at risk for osteoporosis and fractures, ask your health care provider if you should: Be screened for bone loss. Take a calcium or vitamin D supplement to lower your risk of fractures. Be given hormone replacement therapy (HRT) to treat symptoms of menopause. Follow these instructions at home: Alcohol use Do not drink alcohol if: Your health care provider tells you not to drink. You are pregnant, may be pregnant, or are planning to become pregnant. If you drink alcohol: Limit how much you have to: 0-1 drink a day. Know how much alcohol is in your drink. In the [place] one drink equals one 12 oz bottle of beer (355 mL), one 5 oz glass of wine (148 mL), or one 1 oz glass of hard liquor (44 mL). Lifestyle Do not use any products that contain nicotine or tobacco. These products include cigarettes, chewing tobacco, and vaping devices, such as e-cigarettes. If you need help quitting, ask your health care provider. Do not use street drugs. Do not share needles. Ask your health care provider for help if you need support or information about quitting drugs. General instructions Schedule regular health, dental, and eye exams. Stay current with your vaccines. Tell your health care provider if: You often feel depressed. You have ever been abused or do not feel safe at home. Summary Adopting a healthy lifestyle and getting preventive care are important in promoting health  and wellness. Follow your health care provider's instructions about healthy diet, exercising, and getting tested or screened for diseases. Follow your health care provider's instructions on monitoring your cholesterol and blood pressure. This information is not intended to replace advice given to you by your health care provider. Make sure you discuss any questions you have with your health care provider. Document Revised: 08/28/2020 Document Reviewed: 08/28/2020 Elsevier Patient Education  [place].

## 2021-10-17 ENCOUNTER — Other Ambulatory Visit: Payer: Self-pay | Admitting: Internal Medicine

## 2021-10-17 ENCOUNTER — Ambulatory Visit (INDEPENDENT_AMBULATORY_CARE_PROVIDER_SITE_OTHER): Payer: Medicare HMO | Admitting: Internal Medicine

## 2021-10-17 VITALS — BP 118/70 | HR 57 | Temp 97.9°F | Ht 64.0 in | Wt 108.0 lb

## 2021-10-17 DIAGNOSIS — G8929 Other chronic pain: Secondary | ICD-10-CM | POA: Diagnosis not present

## 2021-10-17 DIAGNOSIS — E0590 Thyrotoxicosis, unspecified without thyrotoxic crisis or storm: Secondary | ICD-10-CM

## 2021-10-17 DIAGNOSIS — M85852 Other specified disorders of bone density and structure, left thigh: Secondary | ICD-10-CM

## 2021-10-17 DIAGNOSIS — Z0000 Encounter for general adult medical examination without abnormal findings: Secondary | ICD-10-CM | POA: Diagnosis not present

## 2021-10-17 DIAGNOSIS — Z1231 Encounter for screening mammogram for malignant neoplasm of breast: Secondary | ICD-10-CM

## 2021-10-17 DIAGNOSIS — M533 Sacrococcygeal disorders, not elsewhere classified: Secondary | ICD-10-CM | POA: Diagnosis not present

## 2021-10-17 DIAGNOSIS — M545 Low back pain, unspecified: Secondary | ICD-10-CM | POA: Diagnosis not present

## 2021-10-17 DIAGNOSIS — I10 Essential (primary) hypertension: Secondary | ICD-10-CM

## 2021-10-17 MED ORDER — LISINOPRIL 5 MG PO TABS: 2.5000 mg | ORAL_TABLET | Freq: Every day | ORAL | 0 refills | Status: DC

## 2021-10-17 NOTE — Assessment & Plan Note (Addendum)
Chronic DEXA up-to-date Continue calcium, vitamin D, vitamin K and multivitamin Continue regular exercise

## 2021-10-17 NOTE — Assessment & Plan Note (Addendum)
Chronic Has had intermittent low TSH Asymptomatic TSH slightly low, but stable, free T4 and free T3 normal We will continue to monitor

## 2021-10-17 NOTE — Assessment & Plan Note (Signed)
Chronic Following with Dr Lynann Bologna Has had injection Sees a chiropractor and massage therapist

## 2021-10-17 NOTE — Assessment & Plan Note (Addendum)
Chronic Blood pressure well controlled CMP reviewed-kidney function and potassium normal Continue lisinopril 2.5 mg daily

## 2021-10-17 NOTE — Assessment & Plan Note (Signed)
chronic Seeing Dr Lynann Bologna To have injection soon

## 2021-10-24 DIAGNOSIS — M533 Sacrococcygeal disorders, not elsewhere classified: Secondary | ICD-10-CM | POA: Diagnosis not present

## 2021-10-29 DIAGNOSIS — M5386 Other specified dorsopathies, lumbar region: Secondary | ICD-10-CM | POA: Diagnosis not present

## 2021-10-29 DIAGNOSIS — M9903 Segmental and somatic dysfunction of lumbar region: Secondary | ICD-10-CM | POA: Diagnosis not present

## 2021-10-29 DIAGNOSIS — M9905 Segmental and somatic dysfunction of pelvic region: Secondary | ICD-10-CM | POA: Diagnosis not present

## 2021-10-29 DIAGNOSIS — S338XXA Sprain of other parts of lumbar spine and pelvis, initial encounter: Secondary | ICD-10-CM | POA: Diagnosis not present

## 2021-10-29 DIAGNOSIS — M9902 Segmental and somatic dysfunction of thoracic region: Secondary | ICD-10-CM | POA: Diagnosis not present

## 2021-10-29 DIAGNOSIS — M5414 Radiculopathy, thoracic region: Secondary | ICD-10-CM | POA: Diagnosis not present

## 2021-11-02 ENCOUNTER — Telehealth: Payer: Self-pay | Admitting: Internal Medicine

## 2021-11-02 ENCOUNTER — Other Ambulatory Visit: Payer: Self-pay

## 2021-11-02 MED ORDER — LISINOPRIL 5 MG PO TABS: 2.5000 mg | ORAL_TABLET | Freq: Every day | ORAL | 2 refills | Status: DC

## 2021-11-02 NOTE — Telephone Encounter (Signed)
Sent in today 

## 2021-11-02 NOTE — Telephone Encounter (Signed)
Pt  requested 90 day lisinopril (ZESTRIL)         Phone:  [phone]     [place] [id number] [facility], [place] - [address] Phone:  [phone]

## 2021-11-05 DIAGNOSIS — M9903 Segmental and somatic dysfunction of lumbar region: Secondary | ICD-10-CM | POA: Diagnosis not present

## 2021-11-05 DIAGNOSIS — S338XXA Sprain of other parts of lumbar spine and pelvis, initial encounter: Secondary | ICD-10-CM | POA: Diagnosis not present

## 2021-11-05 DIAGNOSIS — M9905 Segmental and somatic dysfunction of pelvic region: Secondary | ICD-10-CM | POA: Diagnosis not present

## 2021-11-05 DIAGNOSIS — M9902 Segmental and somatic dysfunction of thoracic region: Secondary | ICD-10-CM | POA: Diagnosis not present

## 2021-11-05 DIAGNOSIS — M5386 Other specified dorsopathies, lumbar region: Secondary | ICD-10-CM | POA: Diagnosis not present

## 2021-11-05 DIAGNOSIS — M5414 Radiculopathy, thoracic region: Secondary | ICD-10-CM | POA: Diagnosis not present

## 2021-11-06 ENCOUNTER — Telehealth: Payer: Self-pay | Admitting: Internal Medicine

## 2021-11-06 NOTE — Telephone Encounter (Signed)
LVM for pt to rtn my call to schedule AWV with [facility] call back # [phone] 

## 2021-11-14 DIAGNOSIS — M9902 Segmental and somatic dysfunction of thoracic region: Secondary | ICD-10-CM | POA: Diagnosis not present

## 2021-11-14 DIAGNOSIS — M5386 Other specified dorsopathies, lumbar region: Secondary | ICD-10-CM | POA: Diagnosis not present

## 2021-11-14 DIAGNOSIS — M5414 Radiculopathy, thoracic region: Secondary | ICD-10-CM | POA: Diagnosis not present

## 2021-11-14 DIAGNOSIS — S338XXA Sprain of other parts of lumbar spine and pelvis, initial encounter: Secondary | ICD-10-CM | POA: Diagnosis not present

## 2021-11-14 DIAGNOSIS — M9903 Segmental and somatic dysfunction of lumbar region: Secondary | ICD-10-CM | POA: Diagnosis not present

## 2021-11-14 DIAGNOSIS — M9905 Segmental and somatic dysfunction of pelvic region: Secondary | ICD-10-CM | POA: Diagnosis not present

## 2021-12-10 DIAGNOSIS — M9902 Segmental and somatic dysfunction of thoracic region: Secondary | ICD-10-CM | POA: Diagnosis not present

## 2021-12-10 DIAGNOSIS — M5414 Radiculopathy, thoracic region: Secondary | ICD-10-CM | POA: Diagnosis not present

## 2021-12-10 DIAGNOSIS — M9903 Segmental and somatic dysfunction of lumbar region: Secondary | ICD-10-CM | POA: Diagnosis not present

## 2021-12-10 DIAGNOSIS — M5386 Other specified dorsopathies, lumbar region: Secondary | ICD-10-CM | POA: Diagnosis not present

## 2021-12-10 DIAGNOSIS — S338XXA Sprain of other parts of lumbar spine and pelvis, initial encounter: Secondary | ICD-10-CM | POA: Diagnosis not present

## 2021-12-10 DIAGNOSIS — H26493 Other secondary cataract, bilateral: Secondary | ICD-10-CM | POA: Diagnosis not present

## 2021-12-10 DIAGNOSIS — M9905 Segmental and somatic dysfunction of pelvic region: Secondary | ICD-10-CM | POA: Diagnosis not present

## 2021-12-11 ENCOUNTER — Ambulatory Visit
Admission: RE | Admit: 2021-12-11 | Discharge: 2021-12-11 | Disposition: A | Discharge status: Home / Self Care | Payer: Medicare HMO | Source: Ambulatory Visit | Attending: Internal Medicine | Admitting: Internal Medicine

## 2021-12-11 DIAGNOSIS — Z1231 Encounter for screening mammogram for malignant neoplasm of breast: Secondary | ICD-10-CM

## 2021-12-14 DIAGNOSIS — M5416 Radiculopathy, lumbar region: Secondary | ICD-10-CM | POA: Diagnosis not present

## 2021-12-21 DIAGNOSIS — M48062 Spinal stenosis, lumbar region with neurogenic claudication: Secondary | ICD-10-CM | POA: Diagnosis not present

## 2021-12-31 DIAGNOSIS — M9905 Segmental and somatic dysfunction of pelvic region: Secondary | ICD-10-CM | POA: Diagnosis not present

## 2021-12-31 DIAGNOSIS — S338XXA Sprain of other parts of lumbar spine and pelvis, initial encounter: Secondary | ICD-10-CM | POA: Diagnosis not present

## 2021-12-31 DIAGNOSIS — M9903 Segmental and somatic dysfunction of lumbar region: Secondary | ICD-10-CM | POA: Diagnosis not present

## 2021-12-31 DIAGNOSIS — M9902 Segmental and somatic dysfunction of thoracic region: Secondary | ICD-10-CM | POA: Diagnosis not present

## 2021-12-31 DIAGNOSIS — M5386 Other specified dorsopathies, lumbar region: Secondary | ICD-10-CM | POA: Diagnosis not present

## 2021-12-31 DIAGNOSIS — M5414 Radiculopathy, thoracic region: Secondary | ICD-10-CM | POA: Diagnosis not present

## 2022-01-07 DIAGNOSIS — N390 Urinary tract infection, site not specified: Secondary | ICD-10-CM | POA: Diagnosis not present

## 2022-01-09 ENCOUNTER — Ambulatory Visit: Payer: Medicare HMO | Admitting: Internal Medicine

## 2022-01-15 ENCOUNTER — Ambulatory Visit: Payer: Medicare HMO | Admitting: Gastroenterology

## 2022-01-15 ENCOUNTER — Encounter: Payer: Self-pay | Admitting: Gastroenterology

## 2022-01-15 VITALS — BP 112/70 | HR 65 | Ht 62.5 in | Wt 112.0 lb

## 2022-01-15 DIAGNOSIS — K5900 Constipation, unspecified: Secondary | ICD-10-CM | POA: Diagnosis not present

## 2022-01-15 DIAGNOSIS — Z86010 Personal history of colonic polyps: Secondary | ICD-10-CM | POA: Diagnosis not present

## 2022-01-15 DIAGNOSIS — Z800 Family history of malignant neoplasm of digestive organs: Secondary | ICD-10-CM

## 2022-01-15 NOTE — Progress Notes (Signed)
    Assessment     Personal history of adenomatous colon polyps Family history of colon cancer, first-degree relative, mother at age 77 Constipation   Recommendations    Colonoscopy at 5-year interval recall colonoscopy in Oct 2025.  We discussed guidelines for surveillance colonoscopy in detail and I addressed her questions and concerns.  Miralax qd as needed   HPI    This is a 76 year old female with a personal history of colon polyps and a family history of colon cancer. Her mother and maternal aunt had colon cancer at ages 5 and 20 respectively.  She underwent colonoscopy about 3 years ago as below with 2 small adenomatous colon polyps removed. She has no active GI complaints.  CBC and CMP in June 2023 were unremarkable.    Colonoscopy Oct 2020 - Two 7 mm polyps in the sigmoid colon and in the transverse colon, removed with a cold snare. Resected and retrieved. - One 4 mm polyp in the descending colon, removed with a cold biopsy forceps. Resected and retrieved. - Mild diverticulosis in the left colon. - External and internal hemorrhoids. - Otherwise normal appearing. Path: TAs and inflammatory polyp   Labs / Imaging       Latest Ref Rng & Units 10/10/2021    9:59 AM 10/10/2020   10:05 AM 10/12/2019   11:36 AM  Hepatic Function  Total Protein 6.0 - 8.3 g/dL 6.8  6.8  7.1   Albumin 3.5 - 5.2 g/dL 4.1  4.3  4.5   AST 0 - 37 U/L $Remo'17  17  17   'hmxit$ ALT 0 - 35 U/L $Remo'15  16  15   'fXcoO$ Alk Phosphatase 39 - 117 U/L 49  54  53   Total Bilirubin 0.2 - 1.2 mg/dL 0.5  0.5  0.5        Latest Ref Rng & Units 10/10/2021    9:59 AM 10/10/2020   10:05 AM 10/12/2019   11:36 AM  CBC  WBC 4.0 - 10.5 K/uL 4.3  5.5  5.6   Hemoglobin 12.0 - 15.0 g/dL 13.7  13.6  13.5   Hematocrit 36.0 - 46.0 % 40.5  39.8  39.9   Platelets 150.0 - 400.0 K/uL 222.0  257.0  256.0     Current Medications, Allergies, Past Medical History, Past Surgical History, Family History and Social History were reviewed in  [employer] record.   Physical Exam: General: Well developed, well nourished, no acute distress Head: Normocephalic and atraumatic Eyes: Sclerae anicteric, EOMI Ears: Normal auditory acuity Mouth: Not examined Lungs: Clear throughout to auscultation Heart: Regular rate and rhythm; no murmurs, rubs or bruits Abdomen: Soft, non tender and non distended. No masses, hepatosplenomegaly or hernias noted. Normal Bowel sounds Rectal: Not done Musculoskeletal: Symmetrical with no gross deformities  Pulses:  Normal pulses noted Extremities: No clubbing, cyanosis, edema or deformities noted Neurological: Alert oriented x 4, grossly nonfocal Psychological:  Alert and cooperative. Normal mood and affect   Margaret Craig Plan, MD 01/15/2022, 9:49 AM

## 2022-01-15 NOTE — Patient Instructions (Signed)
You can take over the counter Miralax mixing 17 grams in 8 oz of water/juice daily for constipation.  You will be due for a recall colonoscopy in 01/2024. We will send you a reminder in the mail when it gets closer to that time.  The [facility] providers would like to encourage you to use [facility] to communicate with providers for non-urgent requests or questions.  Due to long hold times on the telephone, sending your provider a message by [facility] may be a faster and more efficient way to get a response.  Please allow 48 business hours for a response.  Please remember that this is for non-urgent requests.   Thank you for choosing me and Margaret Craig.  Pricilla Riffle. Dagoberto Ligas., MD., Marval Regal

## 2022-01-22 DIAGNOSIS — D2261 Melanocytic nevi of right upper limb, including shoulder: Secondary | ICD-10-CM | POA: Diagnosis not present

## 2022-01-22 DIAGNOSIS — L814 Other melanin hyperpigmentation: Secondary | ICD-10-CM | POA: Diagnosis not present

## 2022-01-22 DIAGNOSIS — D1801 Hemangioma of skin and subcutaneous tissue: Secondary | ICD-10-CM | POA: Diagnosis not present

## 2022-01-22 DIAGNOSIS — D3617 Benign neoplasm of peripheral nerves and autonomic nervous system of trunk, unspecified: Secondary | ICD-10-CM | POA: Diagnosis not present

## 2022-01-22 DIAGNOSIS — L821 Other seborrheic keratosis: Secondary | ICD-10-CM | POA: Diagnosis not present

## 2022-01-22 DIAGNOSIS — L853 Xerosis cutis: Secondary | ICD-10-CM | POA: Diagnosis not present

## 2022-01-22 DIAGNOSIS — D225 Melanocytic nevi of trunk: Secondary | ICD-10-CM | POA: Diagnosis not present

## 2022-01-22 DIAGNOSIS — L570 Actinic keratosis: Secondary | ICD-10-CM | POA: Diagnosis not present

## 2022-01-22 DIAGNOSIS — Z85828 Personal history of other malignant neoplasm of skin: Secondary | ICD-10-CM | POA: Diagnosis not present

## 2022-01-23 DIAGNOSIS — M5386 Other specified dorsopathies, lumbar region: Secondary | ICD-10-CM | POA: Diagnosis not present

## 2022-01-23 DIAGNOSIS — M5414 Radiculopathy, thoracic region: Secondary | ICD-10-CM | POA: Diagnosis not present

## 2022-01-23 DIAGNOSIS — M9903 Segmental and somatic dysfunction of lumbar region: Secondary | ICD-10-CM | POA: Diagnosis not present

## 2022-01-23 DIAGNOSIS — M9905 Segmental and somatic dysfunction of pelvic region: Secondary | ICD-10-CM | POA: Diagnosis not present

## 2022-01-23 DIAGNOSIS — M9902 Segmental and somatic dysfunction of thoracic region: Secondary | ICD-10-CM | POA: Diagnosis not present

## 2022-01-23 DIAGNOSIS — S338XXA Sprain of other parts of lumbar spine and pelvis, initial encounter: Secondary | ICD-10-CM | POA: Diagnosis not present

## 2022-01-24 DIAGNOSIS — M48062 Spinal stenosis, lumbar region with neurogenic claudication: Secondary | ICD-10-CM | POA: Diagnosis not present

## 2022-02-05 DIAGNOSIS — M5416 Radiculopathy, lumbar region: Secondary | ICD-10-CM | POA: Diagnosis not present

## 2022-02-19 DIAGNOSIS — M9905 Segmental and somatic dysfunction of pelvic region: Secondary | ICD-10-CM | POA: Diagnosis not present

## 2022-02-19 DIAGNOSIS — M9902 Segmental and somatic dysfunction of thoracic region: Secondary | ICD-10-CM | POA: Diagnosis not present

## 2022-02-19 DIAGNOSIS — M9903 Segmental and somatic dysfunction of lumbar region: Secondary | ICD-10-CM | POA: Diagnosis not present

## 2022-02-19 DIAGNOSIS — M5386 Other specified dorsopathies, lumbar region: Secondary | ICD-10-CM | POA: Diagnosis not present

## 2022-02-19 DIAGNOSIS — M5414 Radiculopathy, thoracic region: Secondary | ICD-10-CM | POA: Diagnosis not present

## 2022-02-19 DIAGNOSIS — S338XXA Sprain of other parts of lumbar spine and pelvis, initial encounter: Secondary | ICD-10-CM | POA: Diagnosis not present

## 2022-03-04 DIAGNOSIS — R0982 Postnasal drip: Secondary | ICD-10-CM | POA: Diagnosis not present

## 2022-03-04 DIAGNOSIS — J0100 Acute maxillary sinusitis, unspecified: Secondary | ICD-10-CM | POA: Diagnosis not present

## 2022-03-18 DIAGNOSIS — M5386 Other specified dorsopathies, lumbar region: Secondary | ICD-10-CM | POA: Diagnosis not present

## 2022-03-18 DIAGNOSIS — S338XXA Sprain of other parts of lumbar spine and pelvis, initial encounter: Secondary | ICD-10-CM | POA: Diagnosis not present

## 2022-03-18 DIAGNOSIS — M9902 Segmental and somatic dysfunction of thoracic region: Secondary | ICD-10-CM | POA: Diagnosis not present

## 2022-03-18 DIAGNOSIS — M9903 Segmental and somatic dysfunction of lumbar region: Secondary | ICD-10-CM | POA: Diagnosis not present

## 2022-03-18 DIAGNOSIS — M9905 Segmental and somatic dysfunction of pelvic region: Secondary | ICD-10-CM | POA: Diagnosis not present

## 2022-03-18 DIAGNOSIS — M5414 Radiculopathy, thoracic region: Secondary | ICD-10-CM | POA: Diagnosis not present

## 2022-04-08 DIAGNOSIS — M5414 Radiculopathy, thoracic region: Secondary | ICD-10-CM | POA: Diagnosis not present

## 2022-04-08 DIAGNOSIS — M9902 Segmental and somatic dysfunction of thoracic region: Secondary | ICD-10-CM | POA: Diagnosis not present

## 2022-04-08 DIAGNOSIS — M5386 Other specified dorsopathies, lumbar region: Secondary | ICD-10-CM | POA: Diagnosis not present

## 2022-04-08 DIAGNOSIS — M9903 Segmental and somatic dysfunction of lumbar region: Secondary | ICD-10-CM | POA: Diagnosis not present

## 2022-04-08 DIAGNOSIS — M9905 Segmental and somatic dysfunction of pelvic region: Secondary | ICD-10-CM | POA: Diagnosis not present

## 2022-04-08 DIAGNOSIS — S338XXA Sprain of other parts of lumbar spine and pelvis, initial encounter: Secondary | ICD-10-CM | POA: Diagnosis not present

## 2022-05-06 DIAGNOSIS — M9903 Segmental and somatic dysfunction of lumbar region: Secondary | ICD-10-CM | POA: Diagnosis not present

## 2022-05-06 DIAGNOSIS — M9905 Segmental and somatic dysfunction of pelvic region: Secondary | ICD-10-CM | POA: Diagnosis not present

## 2022-05-06 DIAGNOSIS — M9902 Segmental and somatic dysfunction of thoracic region: Secondary | ICD-10-CM | POA: Diagnosis not present

## 2022-05-06 DIAGNOSIS — S338XXA Sprain of other parts of lumbar spine and pelvis, initial encounter: Secondary | ICD-10-CM | POA: Diagnosis not present

## 2022-05-06 DIAGNOSIS — M5414 Radiculopathy, thoracic region: Secondary | ICD-10-CM | POA: Diagnosis not present

## 2022-05-06 DIAGNOSIS — M5386 Other specified dorsopathies, lumbar region: Secondary | ICD-10-CM | POA: Diagnosis not present

## 2022-05-27 DIAGNOSIS — M48062 Spinal stenosis, lumbar region with neurogenic claudication: Secondary | ICD-10-CM | POA: Diagnosis not present

## 2022-05-28 DIAGNOSIS — M9903 Segmental and somatic dysfunction of lumbar region: Secondary | ICD-10-CM | POA: Diagnosis not present

## 2022-05-28 DIAGNOSIS — S338XXA Sprain of other parts of lumbar spine and pelvis, initial encounter: Secondary | ICD-10-CM | POA: Diagnosis not present

## 2022-05-28 DIAGNOSIS — M5386 Other specified dorsopathies, lumbar region: Secondary | ICD-10-CM | POA: Diagnosis not present

## 2022-05-28 DIAGNOSIS — M9902 Segmental and somatic dysfunction of thoracic region: Secondary | ICD-10-CM | POA: Diagnosis not present

## 2022-05-28 DIAGNOSIS — M9905 Segmental and somatic dysfunction of pelvic region: Secondary | ICD-10-CM | POA: Diagnosis not present

## 2022-05-28 DIAGNOSIS — M5414 Radiculopathy, thoracic region: Secondary | ICD-10-CM | POA: Diagnosis not present

## 2022-06-04 IMAGING — MG DIGITAL SCREENING BILAT W/ TOMO W/ CAD
8 series · 9 of 24 positions shown · non-contrast
Comparison: Previous exam(s).

CLINICAL DATA: Screening.

EXAM:
DIGITAL SCREENING BILATERAL MAMMOGRAM WITH TOMO AND CAD

[R MLO synth-2D]
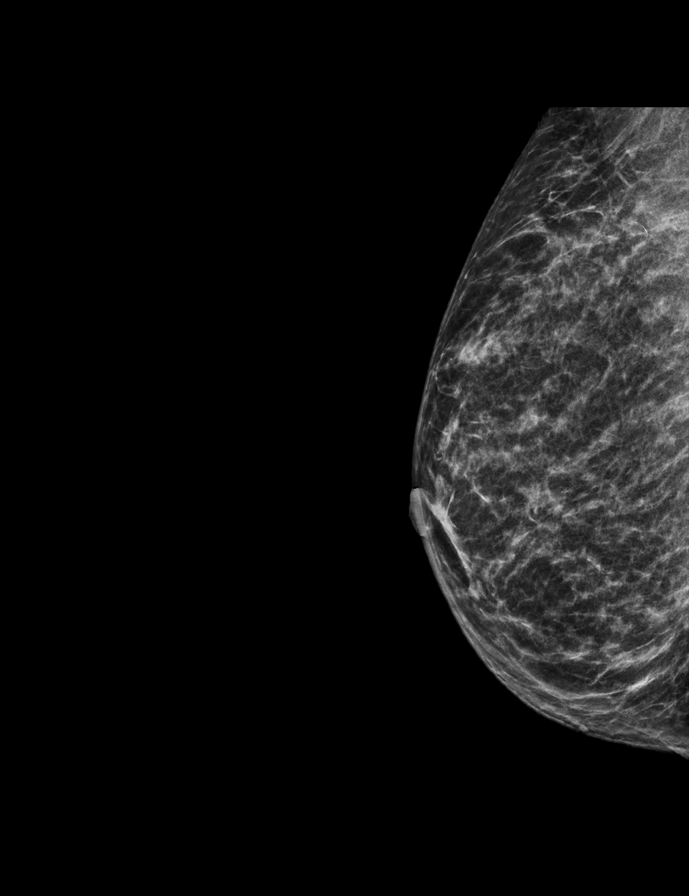

[R CC synth-2D]
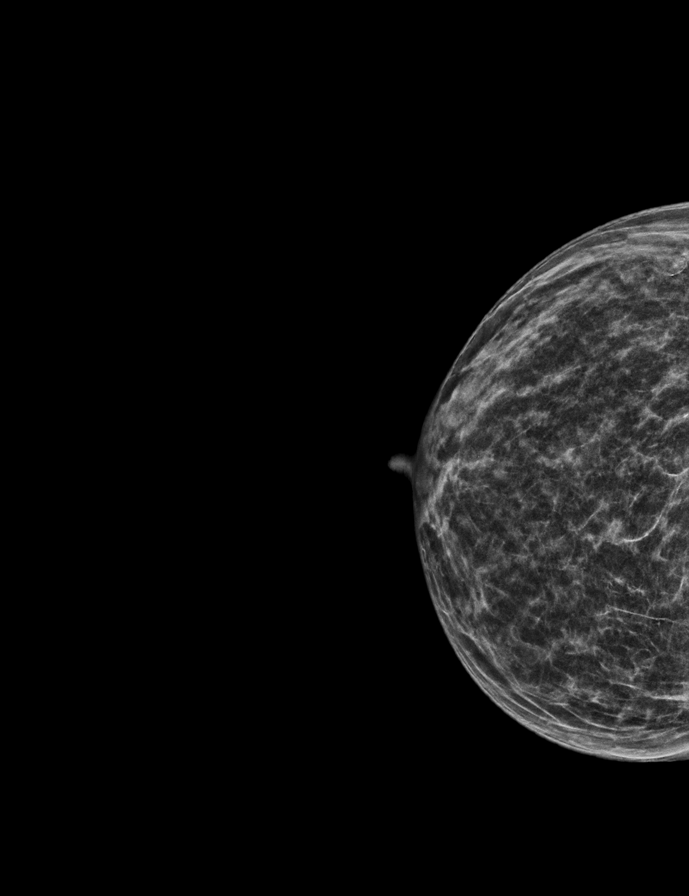

[L CC synth-2D]
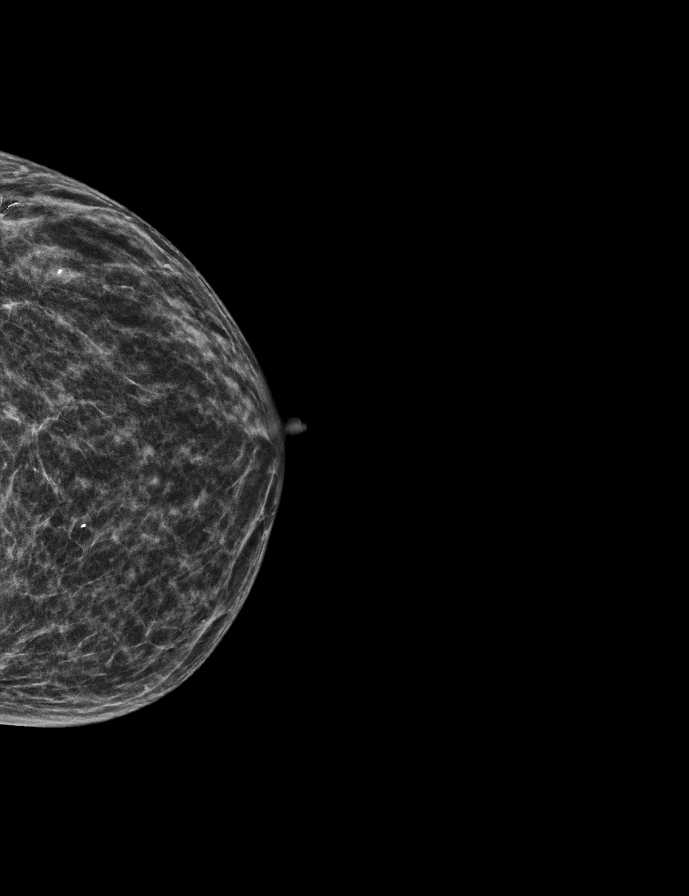

[L MLO synth-2D]
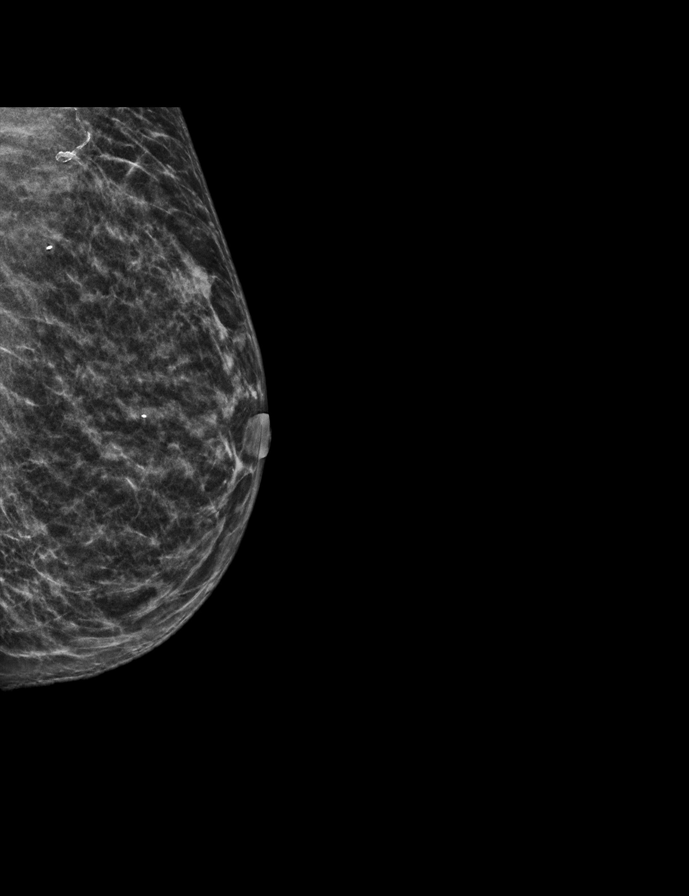

[L MLO tomo · 2 of 40 frames shown]
[frame 13/40]
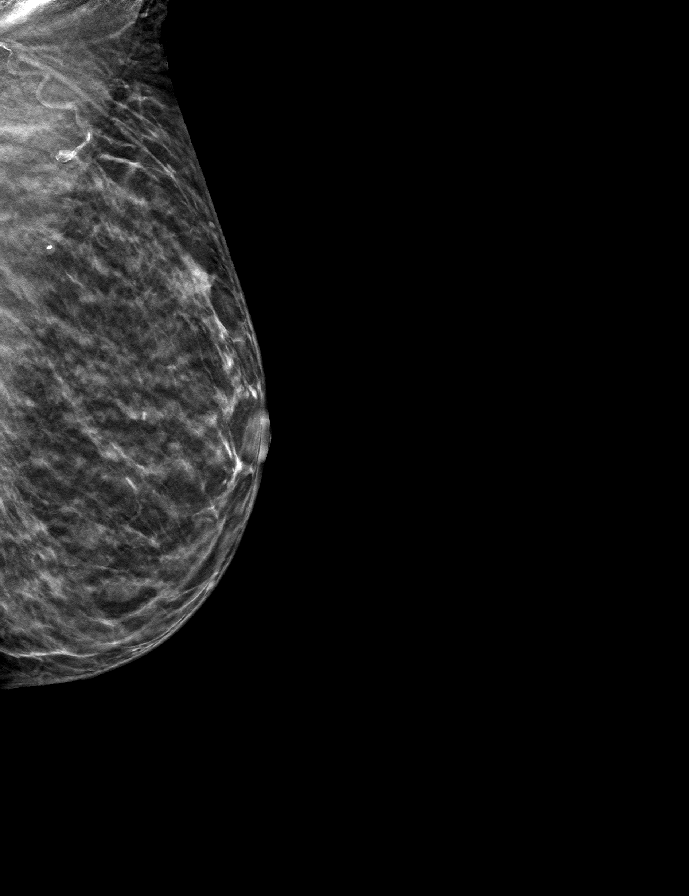
[frame 21/40]
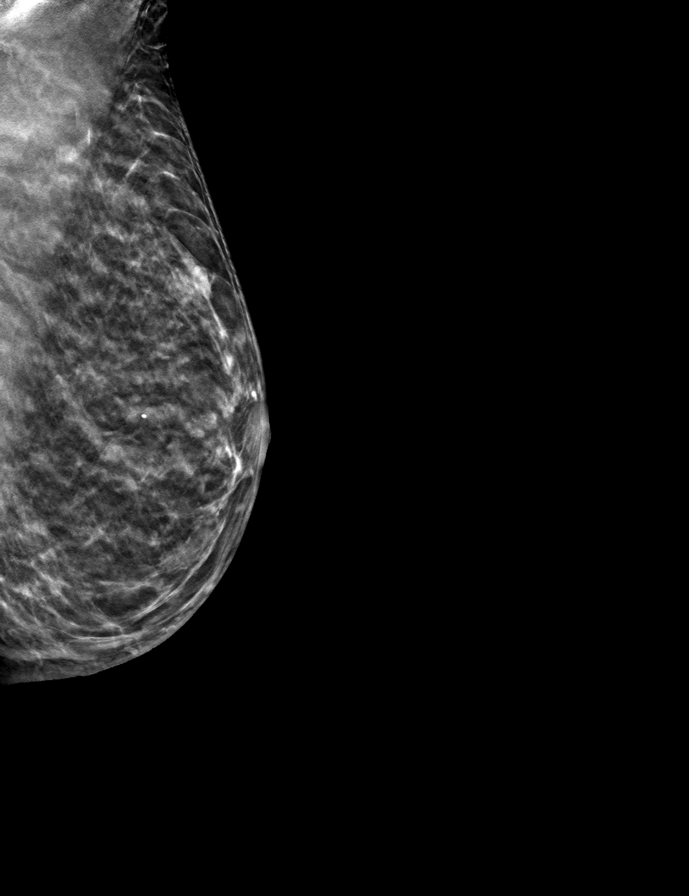

[R CC tomo · tomo slice 21/41.0]
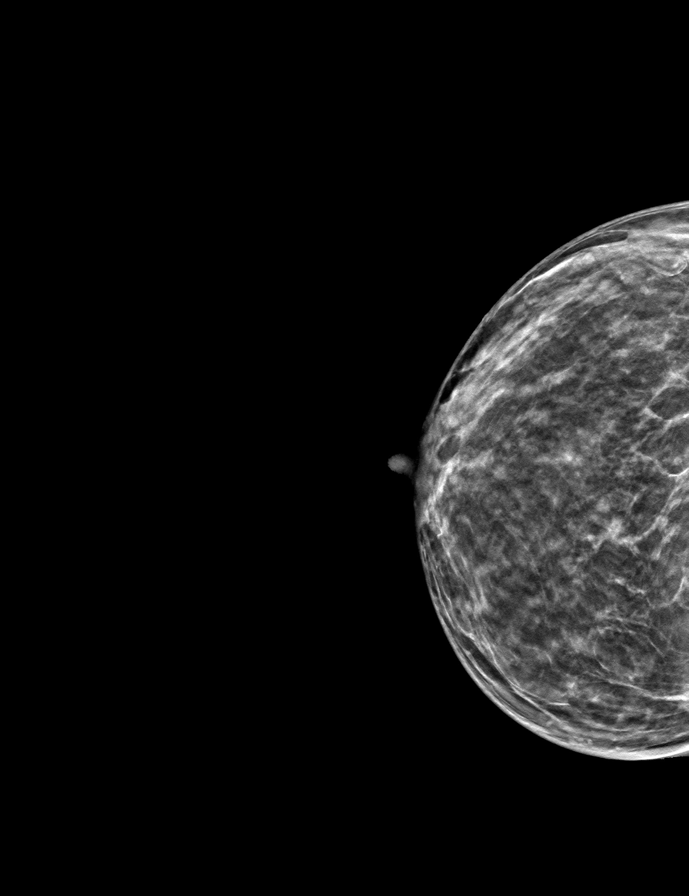

[R MLO tomo · tomo slice 21/40.0]
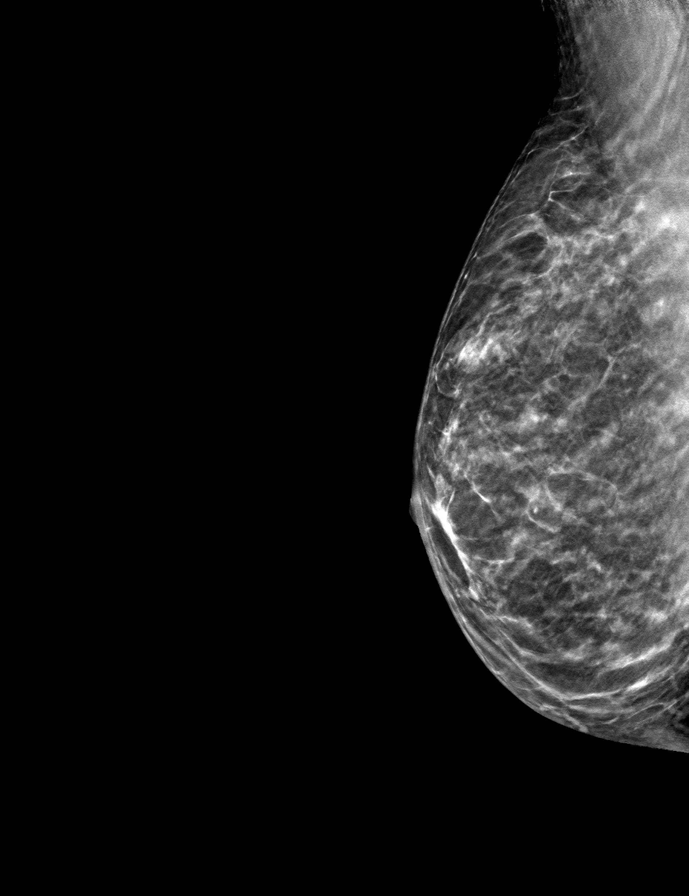

[L CC tomo · tomo slice 20/39.0]
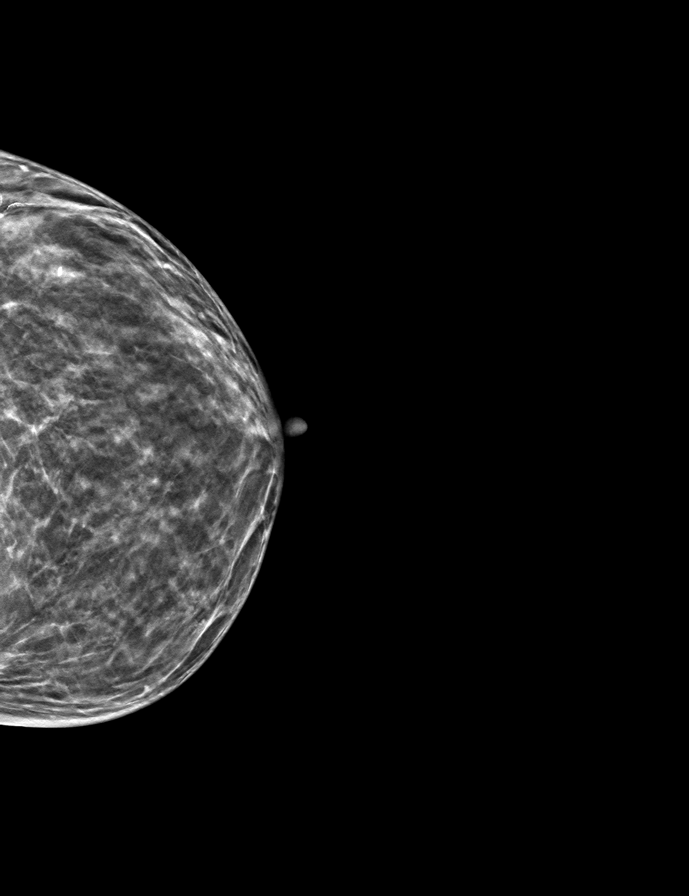

[9 of 24 positions shown; findings below may reference images not displayed]

ACR Breast Density Category c: The breast tissue is heterogeneously
dense, which may obscure small masses.
FINDINGS: There are no findings suspicious for malignancy. Images were
processed with CAD.
IMPRESSION: No mammographic evidence of malignancy. A result letter of this
screening mammogram will be mailed directly to the patient.

RECOMMENDATION:
Screening mammogram in one year. (Code:[id number])

BI-RADS CATEGORY  1: Negative.

## 2022-06-11 DIAGNOSIS — M48062 Spinal stenosis, lumbar region with neurogenic claudication: Secondary | ICD-10-CM | POA: Diagnosis not present

## 2022-06-26 DIAGNOSIS — M9905 Segmental and somatic dysfunction of pelvic region: Secondary | ICD-10-CM | POA: Diagnosis not present

## 2022-06-26 DIAGNOSIS — M5414 Radiculopathy, thoracic region: Secondary | ICD-10-CM | POA: Diagnosis not present

## 2022-06-26 DIAGNOSIS — M9902 Segmental and somatic dysfunction of thoracic region: Secondary | ICD-10-CM | POA: Diagnosis not present

## 2022-06-26 DIAGNOSIS — M5386 Other specified dorsopathies, lumbar region: Secondary | ICD-10-CM | POA: Diagnosis not present

## 2022-06-26 DIAGNOSIS — M9903 Segmental and somatic dysfunction of lumbar region: Secondary | ICD-10-CM | POA: Diagnosis not present

## 2022-06-26 DIAGNOSIS — S338XXA Sprain of other parts of lumbar spine and pelvis, initial encounter: Secondary | ICD-10-CM | POA: Diagnosis not present

## 2022-07-04 ENCOUNTER — Other Ambulatory Visit: Payer: Self-pay

## 2022-07-04 ENCOUNTER — Telehealth: Payer: Self-pay | Admitting: Internal Medicine

## 2022-07-04 MED ORDER — EPINEPHRINE 0.3 MG/0.3ML IJ SOAJ: 0.3000 mg | INTRAMUSCULAR | 0 refills | Status: DC | PRN

## 2022-07-04 NOTE — Telephone Encounter (Signed)
Prescription Request  07/04/2022  LOV: 10/17/2021  What is the name of the medication or equipment? Epi-pen  Have you contacted your pharmacy to request a refill? No   Which pharmacy would you like this sent to?  [facility] IH:[id number], [place] [address] [place] [address] Phone: [phone] Fax: [phone]    Patient notified that their request is being sent to the clinical staff for review and that they should receive a response within 2 business days.   Please advise at Mobile [phone] (mobile)

## 2022-07-04 NOTE — Telephone Encounter (Signed)
Sent in today for patient. 

## 2022-07-08 DIAGNOSIS — M48062 Spinal stenosis, lumbar region with neurogenic claudication: Secondary | ICD-10-CM | POA: Diagnosis not present

## 2022-07-17 DIAGNOSIS — M5386 Other specified dorsopathies, lumbar region: Secondary | ICD-10-CM | POA: Diagnosis not present

## 2022-07-17 DIAGNOSIS — M5414 Radiculopathy, thoracic region: Secondary | ICD-10-CM | POA: Diagnosis not present

## 2022-07-17 DIAGNOSIS — M9903 Segmental and somatic dysfunction of lumbar region: Secondary | ICD-10-CM | POA: Diagnosis not present

## 2022-07-17 DIAGNOSIS — M9902 Segmental and somatic dysfunction of thoracic region: Secondary | ICD-10-CM | POA: Diagnosis not present

## 2022-07-17 DIAGNOSIS — M9905 Segmental and somatic dysfunction of pelvic region: Secondary | ICD-10-CM | POA: Diagnosis not present

## 2022-07-17 DIAGNOSIS — S338XXA Sprain of other parts of lumbar spine and pelvis, initial encounter: Secondary | ICD-10-CM | POA: Diagnosis not present

## 2022-07-24 DIAGNOSIS — Q828 Other specified congenital malformations of skin: Secondary | ICD-10-CM | POA: Diagnosis not present

## 2022-07-24 DIAGNOSIS — L814 Other melanin hyperpigmentation: Secondary | ICD-10-CM | POA: Diagnosis not present

## 2022-07-24 DIAGNOSIS — L570 Actinic keratosis: Secondary | ICD-10-CM | POA: Diagnosis not present

## 2022-07-24 DIAGNOSIS — D2261 Melanocytic nevi of right upper limb, including shoulder: Secondary | ICD-10-CM | POA: Diagnosis not present

## 2022-07-24 DIAGNOSIS — D225 Melanocytic nevi of trunk: Secondary | ICD-10-CM | POA: Diagnosis not present

## 2022-07-24 DIAGNOSIS — D171 Benign lipomatous neoplasm of skin and subcutaneous tissue of trunk: Secondary | ICD-10-CM | POA: Diagnosis not present

## 2022-07-24 DIAGNOSIS — L821 Other seborrheic keratosis: Secondary | ICD-10-CM | POA: Diagnosis not present

## 2022-07-24 DIAGNOSIS — Z85828 Personal history of other malignant neoplasm of skin: Secondary | ICD-10-CM | POA: Diagnosis not present

## 2022-08-07 DIAGNOSIS — M9902 Segmental and somatic dysfunction of thoracic region: Secondary | ICD-10-CM | POA: Diagnosis not present

## 2022-08-07 DIAGNOSIS — S338XXA Sprain of other parts of lumbar spine and pelvis, initial encounter: Secondary | ICD-10-CM | POA: Diagnosis not present

## 2022-08-07 DIAGNOSIS — M9903 Segmental and somatic dysfunction of lumbar region: Secondary | ICD-10-CM | POA: Diagnosis not present

## 2022-08-07 DIAGNOSIS — M5386 Other specified dorsopathies, lumbar region: Secondary | ICD-10-CM | POA: Diagnosis not present

## 2022-08-07 DIAGNOSIS — M9905 Segmental and somatic dysfunction of pelvic region: Secondary | ICD-10-CM | POA: Diagnosis not present

## 2022-08-07 DIAGNOSIS — M5414 Radiculopathy, thoracic region: Secondary | ICD-10-CM | POA: Diagnosis not present

## 2022-08-21 ENCOUNTER — Other Ambulatory Visit: Payer: Self-pay | Admitting: Internal Medicine

## 2022-09-02 ENCOUNTER — Telehealth: Payer: Self-pay | Admitting: Internal Medicine

## 2022-09-02 NOTE — Telephone Encounter (Signed)
Contacted Margaret Craig to schedule their annual wellness visit. Appointment made for 09/17/2022.  [facility] Care Guide [facility] AWV TEAM Direct Dial: [phone]

## 2022-09-10 DIAGNOSIS — M9905 Segmental and somatic dysfunction of pelvic region: Secondary | ICD-10-CM | POA: Diagnosis not present

## 2022-09-10 DIAGNOSIS — M5414 Radiculopathy, thoracic region: Secondary | ICD-10-CM | POA: Diagnosis not present

## 2022-09-10 DIAGNOSIS — M5386 Other specified dorsopathies, lumbar region: Secondary | ICD-10-CM | POA: Diagnosis not present

## 2022-09-10 DIAGNOSIS — S338XXA Sprain of other parts of lumbar spine and pelvis, initial encounter: Secondary | ICD-10-CM | POA: Diagnosis not present

## 2022-09-10 DIAGNOSIS — M9903 Segmental and somatic dysfunction of lumbar region: Secondary | ICD-10-CM | POA: Diagnosis not present

## 2022-09-10 DIAGNOSIS — M9902 Segmental and somatic dysfunction of thoracic region: Secondary | ICD-10-CM | POA: Diagnosis not present

## 2022-09-17 ENCOUNTER — Ambulatory Visit (INDEPENDENT_AMBULATORY_CARE_PROVIDER_SITE_OTHER): Payer: Medicare HMO

## 2022-09-17 VITALS — Wt 110.0 lb

## 2022-09-17 DIAGNOSIS — Z0000 Encounter for general adult medical examination without abnormal findings: Secondary | ICD-10-CM

## 2022-09-17 NOTE — Patient Instructions (Signed)
Margaret Craig , Thank you for taking time to come for your Medicare Wellness Visit. I appreciate your ongoing commitment to your health goals. Please review the following plan we discussed and let me know if I can assist you in the future.   These are the goals we discussed:  Goals       Patient Stated     Patient Stated (pt-stated)      Client understands the importance of follow-up with providers by attending scheduled visits and discussed goals to eat healthier, increase physical activity, exercise the brain, socialize more,  get enough rest and make time for laughter.        This is a list of the screening recommended for you and due dates:  Health Maintenance  Topic Date Due   DTaP/Tdap/Td vaccine (1 - Tdap) Never done   COVID-19 Vaccine (5 - 2023-24 season) 10/03/2023*   Flu Shot  11/21/2022   DEXA scan (bone density measurement)  03/31/2023   Medicare Annual Wellness Visit  09/17/2023   Colon Cancer Screening  02/02/2024   Pneumonia Vaccine  Completed   Hepatitis C Screening  Completed   HPV Vaccine  Aged Out   Zoster (Shingles) Vaccine  Discontinued  *Topic was postponed. The date shown is not the original due date.    Advanced directives: Please bring a copy of your health care power of attorney and living will to the office to be added to your chart at your convenience.   Conditions/risks identified: Aim for 30 minutes of exercise or brisk walking, 6-8 glasses of water, and 5 servings of fruits and vegetables each day.   Next appointment: Follow up in one year for your annual wellness visit 09/18/2023   Preventive Care 65 Years and Older, Female Preventive care refers to lifestyle choices and visits with your health care provider that can promote health and wellness. What does preventive care include? A yearly physical exam. This is also called an annual well check. Dental exams once or twice a year. Routine eye exams. Ask your health care provider how often you should  have your eyes checked. Personal lifestyle choices, including: Daily care of your teeth and gums. Regular physical activity. Eating a healthy diet. Avoiding tobacco and drug use. Limiting alcohol use. Practicing safe sex. Taking low-dose aspirin every day. Taking vitamin and mineral supplements as recommended by your health care provider. What happens during an annual well check? The services and screenings done by your health care provider during your annual well check will depend on your age, overall health, lifestyle risk factors, and family history of disease. Counseling  Your health care provider may ask you questions about your: Alcohol use. Tobacco use. Drug use. Emotional well-being. Home and relationship well-being. Sexual activity. Eating habits. History of falls. Memory and ability to understand (cognition). Work and work Astronomer. Reproductive health. Screening  You may have the following tests or measurements: Height, weight, and BMI. Blood pressure. Lipid and cholesterol levels. These may be checked every 5 years, or more frequently if you are over 61 years old. Skin check. Lung cancer screening. You may have this screening every year starting at age 78 if you have a 30-pack-year history of smoking and currently smoke or have quit within the past 15 years. Fecal occult blood test (FOBT) of the stool. You may have this test every year starting at age 78. Flexible sigmoidoscopy or colonoscopy. You may have a sigmoidoscopy every 5 years or a colonoscopy every 10 years starting  at age 78. Hepatitis C blood test. Hepatitis B blood test. Sexually transmitted disease (STD) testing. Diabetes screening. This is done by checking your blood sugar (glucose) after you have not eaten for a while (fasting). You may have this done every 1-3 years. Bone density scan. This is done to screen for osteoporosis. You may have this done starting at age 78. Mammogram. This may be done  every 1-2 years. Talk to your health care provider about how often you should have regular mammograms. Talk with your health care provider about your test results, treatment options, and if necessary, the need for more tests. Vaccines  Your health care provider may recommend certain vaccines, such as: Influenza vaccine. This is recommended every year. Tetanus, diphtheria, and acellular pertussis (Tdap, Td) vaccine. You may need a Td booster every 10 years. Zoster vaccine. You may need this after age 78. Pneumococcal 13-valent conjugate (PCV13) vaccine. One dose is recommended after age 78. Pneumococcal polysaccharide (PPSV23) vaccine. One dose is recommended after age 78. Talk to your health care provider about which screenings and vaccines you need and how often you need them. This information is not intended to replace advice given to you by your health care provider. Make sure you discuss any questions you have with your health care provider. Document Released: 05/05/2015 Document Revised: 12/27/2015 Document Reviewed: 02/07/2015 Elsevier Interactive Patient Education  2017 [employer].  Fall Prevention in the Home Falls can cause injuries. They can happen to people of all ages. There are many things you can do to make your home safe and to help prevent falls. What can I do on the outside of my home? Regularly fix the edges of walkways and driveways and fix any cracks. Remove anything that might make you trip as you walk through a door, such as a raised step or threshold. Trim any bushes or trees on the path to your home. Use bright outdoor lighting. Clear any walking paths of anything that might make someone trip, such as rocks or tools. Regularly check to see if handrails are loose or broken. Make sure that both sides of any steps have handrails. Any raised decks and porches should have guardrails on the edges. Have any leaves, snow, or ice cleared regularly. Use sand or salt on  walking paths during winter. Clean up any spills in your garage right away. This includes oil or grease spills. What can I do in the bathroom? Use night lights. Install grab bars by the toilet and in the tub and shower. Do not use towel bars as grab bars. Use non-skid mats or decals in the tub or shower. If you need to sit down in the shower, use a plastic, non-slip stool. Keep the floor dry. Clean up any water that spills on the floor as soon as it happens. Remove soap buildup in the tub or shower regularly. Attach bath mats securely with double-sided non-slip rug tape. Do not have throw rugs and other things on the floor that can make you trip. What can I do in the bedroom? Use night lights. Make sure that you have a light by your bed that is easy to reach. Do not use any sheets or blankets that are too big for your bed. They should not hang down onto the floor. Have a firm chair that has side arms. You can use this for support while you get dressed. Do not have throw rugs and other things on the floor that can make you trip. What can  I do in the kitchen? Clean up any spills right away. Avoid walking on wet floors. Keep items that you use a lot in easy-to-reach places. If you need to reach something above you, use a strong step stool that has a grab bar. Keep electrical cords out of the way. Do not use floor polish or wax that makes floors slippery. If you must use wax, use non-skid floor wax. Do not have throw rugs and other things on the floor that can make you trip. What can I do with my stairs? Do not leave any items on the stairs. Make sure that there are handrails on both sides of the stairs and use them. Fix handrails that are broken or loose. Make sure that handrails are as long as the stairways. Check any carpeting to make sure that it is firmly attached to the stairs. Fix any carpet that is loose or worn. Avoid having throw rugs at the top or bottom of the stairs. If you do  have throw rugs, attach them to the floor with carpet tape. Make sure that you have a light switch at the top of the stairs and the bottom of the stairs. If you do not have them, ask someone to add them for you. What else can I do to help prevent falls? Wear shoes that: Do not have high heels. Have rubber bottoms. Are comfortable and fit you well. Are closed at the toe. Do not wear sandals. If you use a stepladder: Make sure that it is fully opened. Do not climb a closed stepladder. Make sure that both sides of the stepladder are locked into place. Ask someone to hold it for you, if possible. Clearly mark and make sure that you can see: Any grab bars or handrails. First and last steps. Where the edge of each step is. Use tools that help you move around (mobility aids) if they are needed. These include: Canes. Walkers. Scooters. Crutches. Turn on the lights when you go into a dark area. Replace any light bulbs as soon as they burn out. Set up your furniture so you have a clear path. Avoid moving your furniture around. If any of your floors are uneven, fix them. If there are any pets around you, be aware of where they are. Review your medicines with your doctor. Some medicines can make you feel dizzy. This can increase your chance of falling. Ask your doctor what other things that you can do to help prevent falls. This information is not intended to replace advice given to you by your health care provider. Make sure you discuss any questions you have with your health care provider. Document Released: 02/02/2009 Document Revised: 09/14/2015 Document Reviewed: 05/13/2014 Elsevier Interactive Patient Education  2017 [employer].

## 2022-09-17 NOTE — Progress Notes (Signed)
Subjective:   Margaret Craig is a 77 y.o. female who presents for Medicare Annual (Subsequent) preventive examination.  Review of Systems    I connected with  Margaret Craig on 09/17/22 by a audio enabled telemedicine application and verified that I am speaking with the correct person using two identifiers.  Patient Location: Home  Provider Location: Home Office  I discussed the limitations of evaluation and management by telemedicine. The patient expressed understanding and agreed to proceed.  Cardiac Risk Factors include: advanced age (>75men, >61 women);hypertension     Objective:    Today's Vitals   09/17/22 0845  Weight: 110 lb (49.9 kg)   Body mass index is 19.8 kg/m.     09/17/2022    8:50 AM 11/13/2020   12:20 PM  Advanced Directives  Does Patient Have a Medical Advance Directive? Yes Yes  Type of Estate agent of [place];Living will Living will;Healthcare Power of Attorney  Does patient want to make changes to medical advance directive?  No - Patient declined  Copy of Healthcare Power of Attorney in Chart? No - copy requested No - copy requested    Current Medications (verified) Outpatient Encounter Medications as of 09/17/2022  Medication Sig   BIOTIN PO Take by mouth.   Calcium Carbonate-Vitamin D 600-200 MG-UNIT TABS Take by mouth.   cholecalciferol (VITAMIN D3) 25 MCG (1000 UNIT) tablet Take 1,000 Units by mouth daily.   EPINEPHrine 0.3 mg/0.3 mL IJ SOAJ injection Inject 0.3 mg into the muscle as needed for anaphylaxis.   lisinopril (ZESTRIL) 5 MG tablet TAKE 1/2 TABLET BY MOUTH DAILY   Multiple Vitamins-Minerals (MULTIVITAMIN WITH MINERALS) tablet Take 1 tablet by mouth daily.   Multiple Vitamins-Minerals (ZINC PO) Take by mouth.   vitamin k 100 MCG tablet Take 100 mcg by mouth daily.   zinc gluconate 50 MG tablet Take 50 mg by mouth daily.   No facility-administered encounter medications on file as of  09/17/2022.    Allergies (verified) Bee venom and Codeine   History: Past Medical History:  Diagnosis Date   Allergy    Arthritis    Colon polyps    Fainting spell    Hypertension    Thyroid disease    UTI (urinary tract infection)    Past Surgical History:  Procedure Laterality Date   COLONOSCOPY     SINUS IRRIGATION     TONSILLECTOMY     Family History  Problem Relation Age of Onset   Mental illness Mother    Colon cancer Mother 21   Cancer Maternal Grandmother        breast   Heart disease Maternal Grandfather    Alcohol abuse Son    Mental illness Son    Heart disease Maternal Aunt    Colon cancer Maternal Aunt        diagnosed in her 30s   Breast cancer Maternal Aunt    Heart disease Paternal Aunt    Alcohol abuse Paternal Uncle    Esophageal cancer Neg Hx    Stomach cancer Neg Hx    Rectal cancer Neg Hx    Social History   Socioeconomic History   Marital status: Widowed    Spouse name: Not on file   Number of children: 3   Years of education: Not on file   Highest education level: Not on file  Occupational History   Not on file  Tobacco Use   Smoking status: Never   Smokeless tobacco: Never  Vaping Use   Vaping Use: Never used  Substance and Sexual Activity   Alcohol use: Yes    Alcohol/week: 6.0 standard drinks of alcohol    Types: 6 Standard drinks or equivalent per week   Drug use: Never   Sexual activity: Not on file  Other Topics Concern   Not on file  Social History Narrative   Not on file   Social Determinants of Health   Financial Resource Strain: Low Risk  (09/17/2022)   Overall Financial Resource Strain (CARDIA)    Difficulty of Paying Living Expenses: Not hard at all  Food Insecurity: No Food Insecurity (09/17/2022)   Hunger Vital Sign    Worried About Running Out of Food in the Last Year: Never true    Ran Out of Food in the Last Year: Never true  Transportation Needs: No Transportation Needs (09/17/2022)   PRAPARE -  Administrator, Civil Service (Medical): No    Lack of Transportation (Non-Medical): No  Physical Activity: Sufficiently Active (09/17/2022)   Exercise Vital Sign    Days of Exercise per Week: 7 days    Minutes of Exercise per Session: 30 min  Stress: No Stress Concern Present (09/17/2022)   Margaret Craig of Occupational Health - Occupational Stress Questionnaire    Feeling of Stress : Not at all  Social Connections: Socially Integrated (09/17/2022)   Social Connection and Isolation Panel [NHANES]    Frequency of Communication with Friends and Family: More than three times a week    Frequency of Social Gatherings with Friends and Family: More than three times a week    Attends Religious Services: More than 4 times per year    Active Member of [employer] or Organizations: Yes    Attends Engineer, structural: More than 4 times per year    Marital Status: Married    Tobacco Counseling Counseling given: Yes   Clinical Intake:  Pre-visit preparation completed: Yes  Pain : No/denies pain     BMI - recorded: 19.8 Nutritional Status: BMI of 19-24  Normal Nutritional Risks: None Diabetes: No  How often do you need to have someone help you when you read instructions, pamphlets, or other written materials from your doctor or pharmacy?: 1 - Never  Diabetic?No  Interpreter Needed?: No  Information entered by :: Margaret Craig   Activities of Daily Living    09/17/2022    8:51 AM  In your present state of health, do you have any difficulty performing the following activities:  Hearing? 0  Vision? 0  Difficulty concentrating or making decisions? 0  Walking or climbing stairs? 0  Dressing or bathing? 0  Doing errands, shopping? 0  Preparing Food and eating ? N  Using the Toilet? N  In the past six months, have you accidently leaked urine? N  Do you have problems with loss of bowel control? N  Managing your Medications? N  Managing your Finances? N   Housekeeping or managing your Housekeeping? N    Patient Care Team: Margaret Sanes, MD as PCP - General (Internal Medicine)  Indicate any recent Medical Services you may have received from other than [facility] providers in the past year (date may be approximate).     Assessment:   This is a routine wellness examination for Margaret Craig.  Hearing/Vision screen Hearing Screening - Comments:: Denies hearing difficulties   Vision Screening - Comments::  up to date with routine eye exams with  Margaret Craig  Dietary issues  and exercise activities discussed: Current Exercise Habits: Home exercise routine, Type of exercise: walking, Time (Minutes): 60, Frequency (Times/Week): 7, Weekly Exercise (Minutes/Week): 420, Exercise limited by: None identified   Goals Addressed   None   Depression Screen    09/17/2022    8:48 AM 10/17/2021   10:06 AM 11/13/2020   12:22 PM 10/13/2020    9:32 AM 10/12/2019   11:20 AM 10/07/2018    2:46 PM 08/18/2018    2:03 PM  PHQ 2/9 Scores  PHQ - 2 Score 0 0 0 0 0 0 0  PHQ- 9 Score  1         Fall Risk    09/17/2022    8:51 AM 10/17/2021   10:07 AM 11/13/2020   12:22 PM 10/13/2020    9:32 AM 07/27/2019    3:18 PM  Fall Risk   Falls in the past year? 0 0 0 0 0  Number falls in past yr: 0  0 0 0  Injury with Fall? 0  0 0 0  Risk for fall due to : No Fall Risks  No Fall Risks    Follow up Falls prevention discussed;Falls evaluation completed  Falls evaluation completed  Falls evaluation completed    FALL RISK PREVENTION PERTAINING TO THE HOME:  Any stairs in or around the home? Yes  If so, are there any without handrails? No  Home free of loose throw rugs in walkways, pet beds, electrical cords, etc? No  Adequate lighting in your home to reduce risk of falls? Yes   ASSISTIVE DEVICES UTILIZED TO PREVENT FALLS:  Life alert? No  Use of a cane, walker or w/c? No  Grab bars in the bathroom? Yes  Shower chair or bench in shower? Yes  Elevated toilet seat or a  handicapped toilet? No   TIMED UP AND GO:  Was the test performed?  NO  televisit .   Cognitive Function:        09/17/2022    8:48 AM  6CIT Screen  What Year? 0 points  What month? 0 points  What time? 0 points  Count back from 20 0 points  Months in reverse 0 points  Repeat phrase 0 points  Total Score 0 points    Immunizations Immunization History  Administered Date(s) Administered   Fluad Quad(high Dose 65+) 12/31/2018, 01/07/2022   Influenza, High Dose Seasonal PF 02/21/2016, 01/13/2018   Influenza,inj,Quad PF,6+ Mos 02/03/2014   Influenza-Unspecified 02/04/2015, 01/20/2017   PFIZER(Purple Top)SARS-COV-2 Vaccination 05/16/2019, 06/07/2019, 09/03/2020   Pfizer Covid-19 Vaccine Bivalent Booster 43yrs & up 01/30/2022   Pneumococcal Conjugate-13 06/06/2014   Pneumococcal Polysaccharide-23 04/22/2013   Zoster Recombinat (Shingrix) 09/19/2016    TDAP status: Due, Education has been provided regarding the importance of this vaccine. Advised may receive this vaccine at local pharmacy or Health Dept. Aware to provide a copy of the vaccination record if obtained from local pharmacy or Health Dept. Verbalized acceptance and understanding.  Flu Vaccine status: Up to date  Pneumococcal vaccine status: Up to date  Covid-19 vaccine status: Declined, Education has been provided regarding the importance of this vaccine but patient still declined. Advised may receive this vaccine at local pharmacy or Health Dept.or vaccine clinic. Aware to provide a copy of the vaccination record if obtained from local pharmacy or Health Dept. Verbalized acceptance and understanding.  Qualifies for Shingles Vaccine? Yes   Zostavax completed Yes   Shingrix Completed?: Yes  Screening Tests Health Maintenance  Topic Date Due  DTaP/Tdap/Td (1 - Tdap) Never done   COVID-19 Vaccine (5 - 2023-24 season) 10/03/2023 (Originally 03/27/2022)   INFLUENZA VACCINE  11/21/2022   DEXA SCAN  03/31/2023    Medicare Annual Wellness (AWV)  09/17/2023   Colonoscopy  02/02/2024   Pneumonia Vaccine 2+ Years old  Completed   Hepatitis C Screening  Completed   HPV VACCINES  Aged Out   Zoster Vaccines- Shingrix  Discontinued    Health Maintenance  Health Maintenance Due  Topic Date Due   DTaP/Tdap/Td (1 - Tdap) Never done    Colorectal cancer screening: Type of screening: Colonoscopy. Completed 02/12/19. Repeat every 5 years  Mammogram status: Completed 12/12/21. Repeat every year  Bone Density status: Completed 03/30/21. Results reflect: Bone density results: NORMAL. Repeat every 10 years.  Lung Cancer Screening: (Low Dose CT Chest recommended if Age 49-80 years, 30 pack-year currently smoking OR have quit w/in 15years.) does not qualify.   Lung Cancer Screening Referral: NO  Additional Screening:  Hepatitis C Screening: does not qualify; Completed 05/29/2015  Vision Screening: Recommended annual ophthalmology exams for early detection of glaucoma and other disorders of the eye. Is the patient up to date with their annual eye exam?  Yes  Who is the provider or what is the name of the office in which the patient attends annual eye exams? Margaret Craig If pt is not established with a provider, would they like to be referred to a provider to establish care? No .   Dental Screening: Recommended annual dental exams for proper oral hygiene  Community Resource Referral / Chronic Care Management: CRR required this visit?  No   CCM required this visit?  No      Plan:     I have personally reviewed and noted the following in the patient's chart:   Medical and social history Use of alcohol, tobacco or illicit drugs  Current medications and supplements including opioid prescriptions. Patient is not currently taking opioid prescriptions. Functional ability and status Nutritional status Physical activity Advanced directives List of other physicians Hospitalizations, surgeries, and ER  visits in previous 12 months Vitals Screenings to include cognitive, depression, and falls Referrals and appointments  In addition, I have reviewed and discussed with patient certain preventive protocols, quality metrics, and best practice recommendations. A written personalized care plan for preventive services as well as general preventive health recommendations were provided to patient.     Annabell Sabal, CMA   09/17/2022   Nurse Notes: None

## 2022-10-08 DIAGNOSIS — M9902 Segmental and somatic dysfunction of thoracic region: Secondary | ICD-10-CM | POA: Diagnosis not present

## 2022-10-08 DIAGNOSIS — M9903 Segmental and somatic dysfunction of lumbar region: Secondary | ICD-10-CM | POA: Diagnosis not present

## 2022-10-08 DIAGNOSIS — M5414 Radiculopathy, thoracic region: Secondary | ICD-10-CM | POA: Diagnosis not present

## 2022-10-08 DIAGNOSIS — S338XXA Sprain of other parts of lumbar spine and pelvis, initial encounter: Secondary | ICD-10-CM | POA: Diagnosis not present

## 2022-10-08 DIAGNOSIS — M5386 Other specified dorsopathies, lumbar region: Secondary | ICD-10-CM | POA: Diagnosis not present

## 2022-10-08 DIAGNOSIS — M9905 Segmental and somatic dysfunction of pelvic region: Secondary | ICD-10-CM | POA: Diagnosis not present

## 2022-10-18 ENCOUNTER — Telehealth (INDEPENDENT_AMBULATORY_CARE_PROVIDER_SITE_OTHER): Payer: Medicare HMO | Admitting: Nurse Practitioner

## 2022-10-18 ENCOUNTER — Encounter: Payer: Self-pay | Admitting: Nurse Practitioner

## 2022-10-18 DIAGNOSIS — U071 COVID-19: Secondary | ICD-10-CM

## 2022-10-18 LAB — COMPREHENSIVE METABOLIC PANEL
ALT: 15 U/L (ref 0–35)
AST: 20 U/L (ref 0–37)
Albumin: 4.2 g/dL (ref 3.5–5.2)
Alkaline Phosphatase: 60 U/L (ref 39–117)
BUN: 13 mg/dL (ref 6–23)
CO2: 31 mEq/L (ref 19–32)
Calcium: 9.5 mg/dL (ref 8.4–10.5)
Chloride: 100 mEq/L (ref 96–112)
Creatinine, Ser: 0.78 mg/dL (ref 0.40–1.20)
GFR: 73.07 mL/min (ref >60.00)
Glucose, Bld: 95 mg/dL (ref 70–99)
Potassium: 4.2 mEq/L (ref 3.5–5.1)
Sodium: 137 mEq/L (ref 135–145)
Total Bilirubin: 0.4 mg/dL (ref 0.2–1.2)
Total Protein: 7 g/dL (ref 6.0–8.3)

## 2022-10-18 MED ORDER — NIRMATRELVIR/RITONAVIR (PAXLOVID)TABLET: 3.0000 | ORAL_TABLET | Freq: Two times a day (BID) | ORAL | 0 refills | Status: AC

## 2022-10-18 NOTE — Assessment & Plan Note (Signed)
Acute, symptoms currently mild Due to age in higher risk category for more severe illness.  Discussed treatment options including risks vs benefit. Patient would like to proceed with paxlovid  Last GFR collected >1 year ago, patient instructed to come to lab for stat cmp and pending result will prescribe paxlovid.  Med list checked for potential drug interactions to covid Patient told to rest, hydrate, walk when she feels up to it and to isolate until symptoms have resolved.

## 2022-10-18 NOTE — Progress Notes (Signed)
Established Patient Office Visit  An audio/visual tele-health visit was completed today for this patient. I connected with  Margaret Craig on 10/18/22 utilizing audio/visual technology and verified that I am speaking with the correct person using two identifiers. The patient was located at their home, and I was located at the office of [facility] at [facility] during the encounter. I discussed the limitations of evaluation and management by telemedicine. The patient expressed understanding and agreed to proceed.    Subjective   Patient ID: Margaret Craig, female    DOB: [birth year]  Age: 77 y.o. MRN: [id number]  Chief Complaint  Patient presents with   Covid Positive    Patient arrived for virtual visit for the above.  Reports that she tested positive for COVID, symptoms started yesterday.  Overall she is feeling well she does have a mildly sore throat.  No cough or shortness of breath.  No fever.  Would like to discuss starting Paxlovid. Has not had Covid 19 infection before. Has completed 3 covid 19 vaccines.    Review of Systems  Constitutional:  Negative for chills and fever.  HENT:  Positive for sore throat.   Respiratory:  Negative for cough and shortness of breath.   Cardiovascular:  Negative for chest pain.  Gastrointestinal:  Negative for diarrhea, nausea and vomiting.  Musculoskeletal:  Negative for back pain.  Neurological:  Negative for dizziness and headaches.      Objective:     There were no vitals taken for this visit.   Physical Exam Comprehensive physical exam not completed today as office visit was conducted remotely.  Patient appears well over video, no acute respiratory distress noted..  Patient was alert and oriented, and appeared to have appropriate judgment.    Results for orders placed or performed in visit on 10/18/22  Comprehensive metabolic panel  Result Value Ref Range   Sodium 137 135 - 145 mEq/L   Potassium  4.2 3.5 - 5.1 mEq/L   Chloride 100 96 - 112 mEq/L   CO2 31 19 - 32 mEq/L   Glucose, Bld 95 70 - 99 mg/dL   BUN 13 6 - 23 mg/dL   Creatinine, Ser 9.56 0.40 - 1.20 mg/dL   Total Bilirubin 0.4 0.2 - 1.2 mg/dL   Alkaline Phosphatase 60 39 - 117 U/L   AST 20 0 - 37 U/L   ALT 15 0 - 35 U/L   Total Protein 7.0 6.0 - 8.3 g/dL   Albumin 4.2 3.5 - 5.2 g/dL   GFR 21.30 >86.57 mL/min   Calcium 9.5 8.4 - 10.5 mg/dL      The 84-ONGE ASCVD risk score (Arnett DK, et al., 2019) is: 21.1%    Assessment & Plan:   Problem List Items Addressed This Visit       Other   COVID-19 - Primary    Acute, symptoms currently mild Due to age in higher risk category for more severe illness.  Discussed treatment options including risks vs benefit. Patient would like to proceed with paxlovid  Last GFR collected >1 year ago, patient instructed to come to lab for stat cmp and pending result will prescribe paxlovid.  Med list checked for potential drug interactions to covid Patient told to rest, hydrate, walk when she feels up to it and to isolate until symptoms have resolved.       Relevant Orders   Comprehensive metabolic panel (Completed)    Update 1712: CMP results reveal GFR of  73.07. Prescription for paxlovid sent to pharmacy and mychart message sent to patient notifying her.   No follow-ups on file.    Elenore Paddy, NP

## 2022-10-21 ENCOUNTER — Encounter: Payer: Medicare HMO | Admitting: Internal Medicine

## 2022-10-29 DIAGNOSIS — M9905 Segmental and somatic dysfunction of pelvic region: Secondary | ICD-10-CM | POA: Diagnosis not present

## 2022-10-29 DIAGNOSIS — M9902 Segmental and somatic dysfunction of thoracic region: Secondary | ICD-10-CM | POA: Diagnosis not present

## 2022-10-29 DIAGNOSIS — S338XXA Sprain of other parts of lumbar spine and pelvis, initial encounter: Secondary | ICD-10-CM | POA: Diagnosis not present

## 2022-10-29 DIAGNOSIS — M5386 Other specified dorsopathies, lumbar region: Secondary | ICD-10-CM | POA: Diagnosis not present

## 2022-10-29 DIAGNOSIS — M5414 Radiculopathy, thoracic region: Secondary | ICD-10-CM | POA: Diagnosis not present

## 2022-10-29 DIAGNOSIS — M9903 Segmental and somatic dysfunction of lumbar region: Secondary | ICD-10-CM | POA: Diagnosis not present

## 2022-11-11 ENCOUNTER — Other Ambulatory Visit: Payer: Self-pay | Admitting: Internal Medicine

## 2022-11-11 ENCOUNTER — Encounter: Payer: Self-pay | Admitting: Family Medicine

## 2022-11-11 ENCOUNTER — Ambulatory Visit: Payer: Medicare HMO | Admitting: Family Medicine

## 2022-11-11 VITALS — BP 126/74 | HR 66 | Temp 97.9°F | Resp 20 | Ht 62.5 in | Wt 110.0 lb

## 2022-11-11 DIAGNOSIS — R300 Dysuria: Secondary | ICD-10-CM | POA: Diagnosis not present

## 2022-11-11 DIAGNOSIS — N3001 Acute cystitis with hematuria: Secondary | ICD-10-CM

## 2022-11-11 DIAGNOSIS — Z1231 Encounter for screening mammogram for malignant neoplasm of breast: Secondary | ICD-10-CM

## 2022-11-11 LAB — POCT URINALYSIS DIPSTICK
Bilirubin, UA: NEGATIVE
Blood, UA: POSITIVE
Glucose, UA: NEGATIVE
Ketones, UA: NEGATIVE
Nitrite, UA: NEGATIVE
Protein, UA: NEGATIVE
Spec Grav, UA: <=1.005 — AB (ref 1.010–1.025)
Urobilinogen, UA: NEGATIVE E.U./dL — AB
pH, UA: 6.5 (ref 5.0–8.0)

## 2022-11-11 MED ORDER — CEFDINIR 300 MG PO CAPS: 300.0000 mg | ORAL_CAPSULE | Freq: Two times a day (BID) | ORAL | 0 refills | Status: AC

## 2022-11-11 NOTE — Addendum Note (Signed)
Addended by: Magdalene River on: 11/11/2022 11:51 AM   Modules accepted: Orders

## 2022-11-11 NOTE — Progress Notes (Signed)
Assessment & Plan:  1. Acute cystitis with hematuria Education provided on UTIs. Encouraged adequate hydration.  - cefdinir (OMNICEF) 300 MG capsule; Take 1 capsule (300 mg total) by mouth 2 (two) times daily for 7 days.  Dispense: 14 capsule; Refill: 0  2. Dysuria Results for orders placed or performed in visit on 11/11/22  POCT urinalysis dipstick  Result Value Ref Range   Color, UA Dark orange    Clarity, UA clear    Glucose, UA Negative Negative   Bilirubin, UA neg    Ketones, UA neg    Spec Grav, UA <=1.005 (A) 1.010 - 1.025   Blood, UA pos    pH, UA 6.5 5.0 - 8.0   Protein, UA Negative Negative   Urobilinogen, UA negative (A) 0.2 or 1.0 E.U./dL   Nitrite, UA neg    Leukocytes, UA Trace (A) Negative   Appearance     Odor    - POCT urinalysis dipstick - Urine Culture   Follow up plan: Return if symptoms worsen or fail to improve.  Deliah Boston, MSN, APRN, FNP-C  Subjective:  HPI: Margaret Craig is a 77 y.o. female presenting on 11/11/2022 for Urinary Tract Infection (S/s started SAT AM - urgency and dysuria - started OTC AZO )  Patient complains of dysuria and urgency. She has had symptoms for 2 days. Patient denies back pain and fever. Patient does have a history of recurrent UTI.  Patient does not have a history of pyelonephritis.    ROS: Negative unless specifically indicated above in HPI.   Relevant past medical history reviewed and updated as indicated.   Allergies and medications reviewed and updated.   Current Outpatient Medications:    Calcium Carbonate-Vitamin D 600-200 MG-UNIT TABS, Take by mouth., Disp: , Rfl:    lisinopril (ZESTRIL) 5 MG tablet, TAKE 1/2 TABLET BY MOUTH DAILY, Disp: 45 tablet, Rfl: 2   Multiple Vitamins-Minerals (MULTIVITAMIN WITH MINERALS) tablet, Take 1 tablet by mouth daily., Disp: , Rfl:    zinc gluconate 50 MG tablet, Take 50 mg by mouth daily., Disp: , Rfl:    EPINEPHrine 0.3 mg/0.3 mL IJ SOAJ injection, Inject  0.3 mg into the muscle as needed for anaphylaxis. (Patient not taking: Reported on 11/11/2022), Disp: 1 each, Rfl: 0  Allergies  Allergen Reactions   Bee Venom    Codeine     Does not tolerate well    Objective:   BP 126/74   Pulse 66   Temp 97.9 F (36.6 C)   Resp 20   Ht 5' 2.5" (1.588 m)   Wt 110 lb (49.9 kg)   BMI 19.80 kg/m    Physical Exam Vitals reviewed.  Constitutional:      General: She is not in acute distress.    Appearance: Normal appearance. She is not ill-appearing, toxic-appearing or diaphoretic.  HENT:     Head: Normocephalic and atraumatic.  Eyes:     General: No scleral icterus.       Right eye: No discharge.        Left eye: No discharge.     Conjunctiva/sclera: Conjunctivae normal.  Cardiovascular:     Rate and Rhythm: Normal rate.  Pulmonary:     Effort: Pulmonary effort is normal. No respiratory distress.  Abdominal:     Tenderness: There is no right CVA tenderness or left CVA tenderness.  Musculoskeletal:        General: Normal range of motion.     Cervical back: Normal  range of motion.  Skin:    General: Skin is warm and dry.     Capillary Refill: Capillary refill takes less than 2 seconds.  Neurological:     General: No focal deficit present.     Mental Status: She is alert and oriented to person, place, and time. Mental status is at baseline.  Psychiatric:        Mood and Affect: Mood normal.        Behavior: Behavior normal.        Thought Content: Thought content normal.        Judgment: Judgment normal.

## 2022-11-14 LAB — URINE CULTURE

## 2022-11-20 ENCOUNTER — Encounter (INDEPENDENT_AMBULATORY_CARE_PROVIDER_SITE_OTHER): Payer: Self-pay

## 2022-12-02 NOTE — Progress Notes (Unsigned)
Subjective:    Patient ID: Margaret Craig, female    DOB: 07/24/1945, 77 y.o.   MRN: [id number]      HPI Margaret Craig is here for a Physical exam and her chronic medical problems.    More leg cramps at night-typically has both legs and can be painful-often wakes her up at night.  She thinks she is drinking of water.  Has not been able to find a pattern.  Couple of episodes ankle and leg swelling.  Both episodes occurred recently-1 was when she visited [place].  The other was with hiking in the mountains which was a little bit more swelling involved her lower legs.  After a few days the swelling did resolve-she thinks she may have lost about 5 pounds when the swelling resolved.  She denies ever experiencing any shortness of breath while she had the swelling.    Medications and allergies reviewed with patient and updated if appropriate.  Current Outpatient Medications on File Prior to Visit  Medication Sig Dispense Refill   Calcium Carbonate-Vitamin D 600-200 MG-UNIT TABS Take by mouth.     lisinopril (ZESTRIL) 5 MG tablet TAKE 1/2 TABLET BY MOUTH DAILY 45 tablet 2   Multiple Vitamins-Minerals (MULTIVITAMIN WITH MINERALS) tablet Take 1 tablet by mouth daily.     zinc gluconate 50 MG tablet Take 50 mg by mouth daily.     EPINEPHrine 0.3 mg/0.3 mL IJ SOAJ injection Inject 0.3 mg into the muscle as needed for anaphylaxis. (Patient not taking: Reported on 11/11/2022) 1 each 0   No current facility-administered medications on file prior to visit.    Review of Systems  Constitutional:  Negative for fever.  HENT:  Positive for trouble swallowing.   Eyes:  Negative for visual disturbance.  Respiratory:  Negative for cough, shortness of breath and wheezing.   Cardiovascular:  Positive for leg swelling (a few occasions). Negative for chest pain and palpitations.  Gastrointestinal:  Negative for abdominal pain, blood in stool, constipation and diarrhea.       No gerd   Genitourinary:  Negative for dysuria.  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 Negative for arthralgias.  Skin:  Negative for rash.  Neurological:  Negative for light-headedness and headaches.  Psychiatric/Behavioral:  Positive for sleep disturbance (takes tylenol pm prn only). Negative for dysphoric mood. The patient is not nervous/anxious.        Objective:   Vitals:   12/03/22 1341  BP: 132/78  Pulse: 63  Temp: 98 F (36.7 C)  SpO2: 96%   Filed Weights   12/03/22 1341  Weight: 110 lb 3.2 oz (50 kg)   Body mass index is 19.83 kg/m.  BP Readings from Last 3 Encounters:  12/03/22 132/78  11/11/22 126/74  01/15/22 112/70    Wt Readings from Last 3 Encounters:  12/03/22 110 lb 3.2 oz (50 kg)  11/11/22 110 lb (49.9 kg)  09/17/22 110 lb (49.9 kg)       Physical Exam Constitutional: She appears well-developed and well-nourished. No distress.  HENT:  Head: Normocephalic and atraumatic.  Right Ear: External ear normal. Normal ear canal and TM Left Ear: External ear normal.  Normal ear canal and TM Mouth/Throat: Oropharynx is clear and moist.  Eyes: Conjunctivae normal.  Neck: Neck supple. No tracheal deviation present. No thyromegaly present.  No carotid bruit  Cardiovascular: Normal rate, regular rhythm and normal heart sounds.   No murmur heard.  No edema. Pulmonary/Chest: Effort normal  and breath sounds normal. No respiratory distress. She has no wheezes. She has no rales.  Breast: deferred   Abdominal: Soft. She exhibits no distension. There is no tenderness.  Lymphadenopathy: She has no cervical adenopathy.  Skin: Skin is warm and dry. She is not diaphoretic.  Psychiatric: She has a normal mood and affect. Her behavior is normal.     Lab Results  Component Value Date   WBC 4.3 10/10/2021   HGB 13.7 10/10/2021   HCT 40.5 10/10/2021   PLT 222.0 10/10/2021   GLUCOSE 95 10/18/2022   CHOL 211 (H) 10/10/2021   TRIG  70.0 10/10/2021   HDL 85.50 10/10/2021   LDLCALC 111 (H) 10/10/2021   ALT 15 10/18/2022   AST 20 10/18/2022   NA 137 10/18/2022   K 4.2 10/18/2022   CL 100 10/18/2022   CREATININE 0.78 10/18/2022   BUN 13 10/18/2022   CO2 31 10/18/2022   TSH 0.14 (L) 10/10/2021         Assessment & Plan:   Physical exam: Screening blood work  ordered Exercise  hiking, walking daily Weight  normal Substance abuse  none   Reviewed recommended immunizations.   Health Maintenance  Topic Date Due   DTaP/Tdap/Td (1 - Tdap) Never done   INFLUENZA VACCINE  07/21/2023 (Originally 11/21/2022)   COVID-19 Vaccine (5 - 2023-24 season) 10/03/2023 (Originally 03/27/2022)   DEXA SCAN  03/31/2023   Medicare Annual Wellness (AWV)  09/17/2023   Colonoscopy  02/02/2024   Pneumonia Vaccine 71+ Years old  Completed   Hepatitis C Screening  Completed   HPV VACCINES  Aged Out   Zoster Vaccines- Shingrix  Discontinued          See Problem List for Assessment and Plan of chronic medical problems.

## 2022-12-02 NOTE — Patient Instructions (Addendum)
Blood work was ordered.   The lab is on the first floor.    Medications changes include :   none    A  thyroid ultrasound was ordered and someone will call you to schedule an appointment.     Return in about 1 year (around 12/03/2023) for Physical Exam.   Health Maintenance, Female Adopting a healthy lifestyle and getting preventive care are important in promoting health and wellness. Ask your health care provider about: The right schedule for you to have regular tests and exams. Things you can do on your own to prevent diseases and keep yourself healthy. What should I know about diet, weight, and exercise? Eat a healthy diet  Eat a diet that includes plenty of vegetables, fruits, low-fat dairy products, and lean protein. Do not eat a lot of foods that are high in solid fats, added sugars, or sodium. Maintain a healthy weight Body mass index (BMI) is used to identify weight problems. It estimates body fat based on height and weight. Your health care provider can help determine your BMI and help you achieve or maintain a healthy weight. Get regular exercise Get regular exercise. This is one of the most important things you can do for your health. Most adults should: Exercise for at least 150 minutes each week. The exercise should increase your heart rate and make you sweat (moderate-intensity exercise). Do strengthening exercises at least twice a week. This is in addition to the moderate-intensity exercise. Spend less time sitting. Even light physical activity can be beneficial. Watch cholesterol and blood lipids Have your blood tested for lipids and cholesterol at 78 years of age, then have this test every 5 years. Have your cholesterol levels checked more often if: Your lipid or cholesterol levels are high. You are older than 78 years of age. You are at high risk for heart disease. What should I know about cancer screening? Depending on your health history and family  history, you may need to have cancer screening at various ages. This may include screening for: Breast cancer. Cervical cancer. Colorectal cancer. Skin cancer. Lung cancer. What should I know about heart disease, diabetes, and high blood pressure? Blood pressure and heart disease High blood pressure causes heart disease and increases the risk of stroke. This is more likely to develop in people who have high blood pressure readings or are overweight. Have your blood pressure checked: Every 3-5 years if you are 78-34 years of age. Every year if you are 44 years old or older. Diabetes Have regular diabetes screenings. This checks your fasting blood sugar level. Have the screening done: Once every three years after age 59 if you are at a normal weight and have a low risk for diabetes. More often and at a younger age if you are overweight or have a high risk for diabetes. What should I know about preventing infection? Hepatitis B If you have a higher risk for hepatitis B, you should be screened for this virus. Talk with your health care provider to find out if you are at risk for hepatitis B infection. Hepatitis C Testing is recommended for: Everyone born from [birth year] through 1966. Anyone with known risk factors for hepatitis C. Sexually transmitted infections (STIs) Get screened for STIs, including gonorrhea and chlamydia, if: You are sexually active and are younger than 78 years of age. You are older than 78 years of age and your health care provider tells you that you are at risk for  this type of infection. Your sexual activity has changed since you were last screened, and you are at increased risk for chlamydia or gonorrhea. Ask your health care provider if you are at risk. Ask your health care provider about whether you are at high risk for HIV. Your health care provider may recommend a prescription medicine to help prevent HIV infection. If you choose to take medicine to prevent HIV, you  should first get tested for HIV. You should then be tested every 3 months for as long as you are taking the medicine. Pregnancy If you are about to stop having your period (premenopausal) and you may become pregnant, seek counseling before you get pregnant. Take 400 to 800 micrograms (mcg) of folic acid every day if you become pregnant. Ask for birth control (contraception) if you want to prevent pregnancy. Osteoporosis and menopause Osteoporosis is a disease in which the bones lose minerals and strength with aging. This can result in bone fractures. If you are 37 years old or older, or if you are at risk for osteoporosis and fractures, ask your health care provider if you should: Be screened for bone loss. Take a calcium or vitamin D supplement to lower your risk of fractures. Be given hormone replacement therapy (HRT) to treat symptoms of menopause. Follow these instructions at home: Alcohol use Do not drink alcohol if: Your health care provider tells you not to drink. You are pregnant, may be pregnant, or are planning to become pregnant. If you drink alcohol: Limit how much you have to: 0-1 drink a day. Know how much alcohol is in your drink. In the [place] one drink equals one 12 oz bottle of beer (355 mL), one 5 oz glass of wine (148 mL), or one 1 oz glass of hard liquor (44 mL). Lifestyle Do not use any products that contain nicotine or tobacco. These products include cigarettes, chewing tobacco, and vaping devices, such as e-cigarettes. If you need help quitting, ask your health care provider. Do not use street drugs. Do not share needles. Ask your health care provider for help if you need support or information about quitting drugs. General instructions Schedule regular health, dental, and eye exams. Stay current with your vaccines. Tell your health care provider if: You often feel depressed. You have ever been abused or do not feel safe at home. Summary Adopting a healthy  lifestyle and getting preventive care are important in promoting health and wellness. Follow your health care provider's instructions about healthy diet, exercising, and getting tested or screened for diseases. Follow your health care provider's instructions on monitoring your cholesterol and blood pressure. This information is not intended to replace advice given to you by your health care provider. Make sure you discuss any questions you have with your health care provider. Document Revised: 08/28/2020 Document Reviewed: 08/28/2020 Elsevier Patient Education  2024 [employer].

## 2022-12-03 ENCOUNTER — Encounter: Payer: Self-pay | Admitting: Internal Medicine

## 2022-12-03 ENCOUNTER — Ambulatory Visit (INDEPENDENT_AMBULATORY_CARE_PROVIDER_SITE_OTHER): Payer: Medicare HMO | Admitting: Internal Medicine

## 2022-12-03 VITALS — BP 132/78 | HR 63 | Temp 98.0°F | Ht 62.5 in | Wt 110.2 lb

## 2022-12-03 DIAGNOSIS — I10 Essential (primary) hypertension: Secondary | ICD-10-CM

## 2022-12-03 DIAGNOSIS — M85852 Other specified disorders of bone density and structure, left thigh: Secondary | ICD-10-CM | POA: Diagnosis not present

## 2022-12-03 DIAGNOSIS — E041 Nontoxic single thyroid nodule: Secondary | ICD-10-CM

## 2022-12-03 DIAGNOSIS — G4762 Sleep related leg cramps: Secondary | ICD-10-CM | POA: Diagnosis not present

## 2022-12-03 DIAGNOSIS — Z0000 Encounter for general adult medical examination without abnormal findings: Secondary | ICD-10-CM

## 2022-12-03 DIAGNOSIS — R1310 Dysphagia, unspecified: Secondary | ICD-10-CM

## 2022-12-03 DIAGNOSIS — E0590 Thyrotoxicosis, unspecified without thyrotoxic crisis or storm: Secondary | ICD-10-CM | POA: Diagnosis not present

## 2022-12-03 LAB — CBC WITH DIFFERENTIAL/PLATELET
Basophils Absolute: 0 10*3/uL (ref 0.0–0.1)
Basophils Relative: 0.9 % (ref 0.0–3.0)
Eosinophils Absolute: 0.1 10*3/uL (ref 0.0–0.7)
Eosinophils Relative: 2.7 % (ref 0.0–5.0)
HCT: 37.4 % (ref 36.0–46.0)
Hemoglobin: 12.3 g/dL (ref 12.0–15.0)
Lymphocytes Relative: 26.9 % (ref 12.0–46.0)
Lymphs Abs: 1.3 10*3/uL (ref 0.7–4.0)
MCHC: 32.9 g/dL (ref 30.0–36.0)
MCV: 99.4 fl (ref 78.0–100.0)
Monocytes Absolute: 0.5 10*3/uL (ref 0.1–1.0)
Monocytes Relative: 10.8 % (ref 3.0–12.0)
Neutro Abs: 2.9 10*3/uL (ref 1.4–7.7)
Neutrophils Relative %: 58.7 % (ref 43.0–77.0)
Platelets: 267 10*3/uL (ref 150.0–400.0)
RBC: 3.77 Mil/uL — ABNORMAL LOW (ref 3.87–5.11)
RDW: 13.6 % (ref 11.5–15.5)
WBC: 5 10*3/uL (ref 4.0–10.5)

## 2022-12-03 LAB — COMPREHENSIVE METABOLIC PANEL
ALT: 16 U/L (ref 0–35)
AST: 19 U/L (ref 0–37)
Albumin: 4.3 g/dL (ref 3.5–5.2)
Alkaline Phosphatase: 48 U/L (ref 39–117)
BUN: 19 mg/dL (ref 6–23)
CO2: 28 mEq/L (ref 19–32)
Calcium: 9.5 mg/dL (ref 8.4–10.5)
Chloride: 103 mEq/L (ref 96–112)
Creatinine, Ser: 0.89 mg/dL (ref 0.40–1.20)
GFR: 62.32 mL/min (ref >60.00)
Glucose, Bld: 91 mg/dL (ref 70–99)
Potassium: 4.2 mEq/L (ref 3.5–5.1)
Sodium: 141 mEq/L (ref 135–145)
Total Bilirubin: 0.3 mg/dL (ref 0.2–1.2)
Total Protein: 6.8 g/dL (ref 6.0–8.3)

## 2022-12-03 LAB — LIPID PANEL
Cholesterol: 217 mg/dL — ABNORMAL HIGH (ref 0–200)
HDL: 90.4 mg/dL (ref >39.00)
LDL Cholesterol: 111 mg/dL — ABNORMAL HIGH (ref 0–99)
NonHDL: 126.64
Total CHOL/HDL Ratio: 2
Triglycerides: 76 mg/dL (ref 0.0–149.0)
VLDL: 15.2 mg/dL (ref 0.0–40.0)

## 2022-12-03 LAB — VITAMIN D 25 HYDROXY (VIT D DEFICIENCY, FRACTURES): VITD: 49.2 ng/mL (ref 30.00–100.00)

## 2022-12-03 LAB — T4, FREE: Free T4: 0.97 ng/dL (ref 0.60–1.60)

## 2022-12-03 LAB — TSH: TSH: 0.13 u[IU]/mL — ABNORMAL LOW (ref 0.35–5.50)

## 2022-12-03 LAB — T3, FREE: T3, Free: 3.4 pg/mL (ref 2.3–4.2)

## 2022-12-03 NOTE — Assessment & Plan Note (Signed)
Chronic Prefers not to take any medication DEXA up-to-date, but due the end of the year We will hold off on ordering that until her next visit, sooner if she wishes Continue regular exercise Continue calcium and vitamin D Check vitamin D level

## 2022-12-03 NOTE — Assessment & Plan Note (Addendum)
Chronic Blood pressure well controlled Recent CMP reviewed-kidney function and potassium normal Continue lisinopril 2.5 mg daily

## 2022-12-03 NOTE — Assessment & Plan Note (Signed)
History of nodule Has been biopsied in the past Given subclinical hypothyroidism recheck thyroid ultrasound Also states some difficulty swallowing at times which really started after she had throat swelling from a wasp sting Difficulty swallowing does not occur often Ultrasound thyroid

## 2022-12-03 NOTE — Assessment & Plan Note (Signed)
Chronic Has had intermittent low TSH Asymptomatic TSH slightly low, but stable, free T4 and free T3 normal Recheck labs today Consider endocrine referral given osteoporosis

## 2022-12-03 NOTE — Assessment & Plan Note (Signed)
New Occasional difficulty swallowing-does not occur often Has noticed this with her calcium pill and some foods Denies any difficulty swallowing liquids No GERD Discussed possible referral versus monitoring and since this does not occur frequently we will monitor for now, see what the thyroid ultrasound shows and if this persists or increases refer to GI

## 2022-12-03 NOTE — Assessment & Plan Note (Signed)
New Experiencing more frequent nocturnal leg cramping Discussed most common causes-dehydration, too much exercise, electrolyte abnormality or orthopedic issues Could be dehydration-will try to increase her water intake especially on days where she is doing a lot of hiking-cramping may improve as the weather gets cooler Can try adding some electrolytes or extra potassium to see if that helps CMP

## 2022-12-04 DIAGNOSIS — M5414 Radiculopathy, thoracic region: Secondary | ICD-10-CM | POA: Diagnosis not present

## 2022-12-04 DIAGNOSIS — S338XXA Sprain of other parts of lumbar spine and pelvis, initial encounter: Secondary | ICD-10-CM | POA: Diagnosis not present

## 2022-12-04 DIAGNOSIS — M9902 Segmental and somatic dysfunction of thoracic region: Secondary | ICD-10-CM | POA: Diagnosis not present

## 2022-12-04 DIAGNOSIS — M9903 Segmental and somatic dysfunction of lumbar region: Secondary | ICD-10-CM | POA: Diagnosis not present

## 2022-12-04 DIAGNOSIS — M5386 Other specified dorsopathies, lumbar region: Secondary | ICD-10-CM | POA: Diagnosis not present

## 2022-12-04 DIAGNOSIS — M9905 Segmental and somatic dysfunction of pelvic region: Secondary | ICD-10-CM | POA: Diagnosis not present

## 2022-12-05 NOTE — Addendum Note (Signed)
Addended by: Pincus Sanes on: 12/05/2022 07:30 PM   Modules accepted: Orders

## 2022-12-09 ENCOUNTER — Ambulatory Visit
Admission: RE | Admit: 2022-12-09 | Discharge: 2022-12-09 | Disposition: A | Discharge status: Home / Self Care | Payer: Medicare HMO | Source: Ambulatory Visit | Attending: Internal Medicine | Admitting: Internal Medicine

## 2022-12-09 DIAGNOSIS — E0590 Thyrotoxicosis, unspecified without thyrotoxic crisis or storm: Secondary | ICD-10-CM

## 2022-12-09 DIAGNOSIS — E041 Nontoxic single thyroid nodule: Secondary | ICD-10-CM

## 2022-12-09 DIAGNOSIS — E042 Nontoxic multinodular goiter: Secondary | ICD-10-CM | POA: Diagnosis not present

## 2022-12-16 ENCOUNTER — Ambulatory Visit: Payer: Medicare HMO

## 2022-12-16 ENCOUNTER — Ambulatory Visit
Admission: RE | Admit: 2022-12-16 | Discharge: 2022-12-16 | Disposition: A | Discharge status: Home / Self Care | Payer: Medicare HMO | Source: Ambulatory Visit | Attending: Internal Medicine | Admitting: Internal Medicine

## 2022-12-16 DIAGNOSIS — Z1231 Encounter for screening mammogram for malignant neoplasm of breast: Secondary | ICD-10-CM

## 2022-12-26 DIAGNOSIS — H26493 Other secondary cataract, bilateral: Secondary | ICD-10-CM | POA: Diagnosis not present

## 2022-12-26 DIAGNOSIS — H04123 Dry eye syndrome of bilateral lacrimal glands: Secondary | ICD-10-CM | POA: Diagnosis not present

## 2023-01-01 DIAGNOSIS — M9902 Segmental and somatic dysfunction of thoracic region: Secondary | ICD-10-CM | POA: Diagnosis not present

## 2023-01-01 DIAGNOSIS — S338XXA Sprain of other parts of lumbar spine and pelvis, initial encounter: Secondary | ICD-10-CM | POA: Diagnosis not present

## 2023-01-01 DIAGNOSIS — M9903 Segmental and somatic dysfunction of lumbar region: Secondary | ICD-10-CM | POA: Diagnosis not present

## 2023-01-01 DIAGNOSIS — M9905 Segmental and somatic dysfunction of pelvic region: Secondary | ICD-10-CM | POA: Diagnosis not present

## 2023-01-01 DIAGNOSIS — M5386 Other specified dorsopathies, lumbar region: Secondary | ICD-10-CM | POA: Diagnosis not present

## 2023-01-01 DIAGNOSIS — M5414 Radiculopathy, thoracic region: Secondary | ICD-10-CM | POA: Diagnosis not present

## 2023-02-11 DIAGNOSIS — M5414 Radiculopathy, thoracic region: Secondary | ICD-10-CM | POA: Diagnosis not present

## 2023-02-11 DIAGNOSIS — M9903 Segmental and somatic dysfunction of lumbar region: Secondary | ICD-10-CM | POA: Diagnosis not present

## 2023-02-11 DIAGNOSIS — S338XXA Sprain of other parts of lumbar spine and pelvis, initial encounter: Secondary | ICD-10-CM | POA: Diagnosis not present

## 2023-02-11 DIAGNOSIS — M9902 Segmental and somatic dysfunction of thoracic region: Secondary | ICD-10-CM | POA: Diagnosis not present

## 2023-02-11 DIAGNOSIS — M9905 Segmental and somatic dysfunction of pelvic region: Secondary | ICD-10-CM | POA: Diagnosis not present

## 2023-02-11 DIAGNOSIS — M5386 Other specified dorsopathies, lumbar region: Secondary | ICD-10-CM | POA: Diagnosis not present

## 2023-03-11 DIAGNOSIS — M9905 Segmental and somatic dysfunction of pelvic region: Secondary | ICD-10-CM | POA: Diagnosis not present

## 2023-03-11 DIAGNOSIS — M5414 Radiculopathy, thoracic region: Secondary | ICD-10-CM | POA: Diagnosis not present

## 2023-03-11 DIAGNOSIS — M9902 Segmental and somatic dysfunction of thoracic region: Secondary | ICD-10-CM | POA: Diagnosis not present

## 2023-03-11 DIAGNOSIS — S338XXA Sprain of other parts of lumbar spine and pelvis, initial encounter: Secondary | ICD-10-CM | POA: Diagnosis not present

## 2023-03-11 DIAGNOSIS — M5386 Other specified dorsopathies, lumbar region: Secondary | ICD-10-CM | POA: Diagnosis not present

## 2023-03-11 DIAGNOSIS — M9903 Segmental and somatic dysfunction of lumbar region: Secondary | ICD-10-CM | POA: Diagnosis not present

## 2023-04-07 DIAGNOSIS — M48062 Spinal stenosis, lumbar region with neurogenic claudication: Secondary | ICD-10-CM | POA: Diagnosis not present

## 2023-04-08 DIAGNOSIS — S338XXA Sprain of other parts of lumbar spine and pelvis, initial encounter: Secondary | ICD-10-CM | POA: Diagnosis not present

## 2023-04-08 DIAGNOSIS — M9902 Segmental and somatic dysfunction of thoracic region: Secondary | ICD-10-CM | POA: Diagnosis not present

## 2023-04-08 DIAGNOSIS — M5386 Other specified dorsopathies, lumbar region: Secondary | ICD-10-CM | POA: Diagnosis not present

## 2023-04-08 DIAGNOSIS — M9905 Segmental and somatic dysfunction of pelvic region: Secondary | ICD-10-CM | POA: Diagnosis not present

## 2023-04-08 DIAGNOSIS — M9903 Segmental and somatic dysfunction of lumbar region: Secondary | ICD-10-CM | POA: Diagnosis not present

## 2023-04-08 DIAGNOSIS — M5414 Radiculopathy, thoracic region: Secondary | ICD-10-CM | POA: Diagnosis not present

## 2023-04-10 DIAGNOSIS — M545 Low back pain, unspecified: Secondary | ICD-10-CM | POA: Diagnosis not present

## 2023-04-23 HISTORY — PX: BACK SURGERY: SHX140

## 2023-05-06 DIAGNOSIS — M5416 Radiculopathy, lumbar region: Secondary | ICD-10-CM | POA: Diagnosis not present

## 2023-05-07 ENCOUNTER — Other Ambulatory Visit: Payer: Self-pay | Admitting: Internal Medicine

## 2023-05-07 DIAGNOSIS — L723 Sebaceous cyst: Secondary | ICD-10-CM | POA: Diagnosis not present

## 2023-05-07 DIAGNOSIS — D225 Melanocytic nevi of trunk: Secondary | ICD-10-CM | POA: Diagnosis not present

## 2023-05-07 DIAGNOSIS — L570 Actinic keratosis: Secondary | ICD-10-CM | POA: Diagnosis not present

## 2023-05-07 DIAGNOSIS — L821 Other seborrheic keratosis: Secondary | ICD-10-CM | POA: Diagnosis not present

## 2023-05-07 DIAGNOSIS — L814 Other melanin hyperpigmentation: Secondary | ICD-10-CM | POA: Diagnosis not present

## 2023-05-07 DIAGNOSIS — D2261 Melanocytic nevi of right upper limb, including shoulder: Secondary | ICD-10-CM | POA: Diagnosis not present

## 2023-05-07 DIAGNOSIS — Z85828 Personal history of other malignant neoplasm of skin: Secondary | ICD-10-CM | POA: Diagnosis not present

## 2023-05-07 DIAGNOSIS — D2272 Melanocytic nevi of left lower limb, including hip: Secondary | ICD-10-CM | POA: Diagnosis not present

## 2023-05-07 DIAGNOSIS — D3617 Benign neoplasm of peripheral nerves and autonomic nervous system of trunk, unspecified: Secondary | ICD-10-CM | POA: Diagnosis not present

## 2023-05-13 DIAGNOSIS — S338XXA Sprain of other parts of lumbar spine and pelvis, initial encounter: Secondary | ICD-10-CM | POA: Diagnosis not present

## 2023-05-13 DIAGNOSIS — M9902 Segmental and somatic dysfunction of thoracic region: Secondary | ICD-10-CM | POA: Diagnosis not present

## 2023-05-13 DIAGNOSIS — M5386 Other specified dorsopathies, lumbar region: Secondary | ICD-10-CM | POA: Diagnosis not present

## 2023-05-13 DIAGNOSIS — M5414 Radiculopathy, thoracic region: Secondary | ICD-10-CM | POA: Diagnosis not present

## 2023-05-13 DIAGNOSIS — M9905 Segmental and somatic dysfunction of pelvic region: Secondary | ICD-10-CM | POA: Diagnosis not present

## 2023-05-13 DIAGNOSIS — M9903 Segmental and somatic dysfunction of lumbar region: Secondary | ICD-10-CM | POA: Diagnosis not present

## 2023-05-15 ENCOUNTER — Other Ambulatory Visit: Payer: Self-pay | Admitting: Orthopedic Surgery

## 2023-05-28 NOTE — Pre-Procedure Instructions (Signed)
 Surgical Instructions   Your procedure is scheduled on Wednesday, February 12th. Report to [facility] Main Entrance A at 06:30 A.M., then check in with the Admitting office. Any questions or running late day of surgery: call [phone]  Questions prior to your surgery date: call [phone], Monday-Friday, 8am-4pm. If you experience any cold or flu symptoms such as cough, fever, chills, shortness of breath, etc. between now and your scheduled surgery, please notify us  at the above number.     Remember:  Do not eat after midnight the night before your surgery  You may drink clear liquids until 05:30 AM the morning of your surgery.   Clear liquids allowed are: Water, Non-Citrus Juices (without pulp), Carbonated Beverages, Clear Tea (no milk, honey, etc.), Black Coffee Only (NO MILK, CREAM OR POWDERED CREAMER of any kind), and Gatorade.  Patient Instructions  The night before surgery:  No food after midnight. ONLY clear liquids after midnight  The day of surgery (if you do NOT have diabetes):  Drink ONE (1) Pre-Surgery Clear Ensure by 05:30 AM the morning of surgery. Drink in one sitting. Do not sip.  This drink was given to you during your hospital  pre-op appointment visit.  Nothing else to drink after completing the  Pre-Surgery Clear Ensure.          If you have questions, please contact your surgeon's office.    Take these medicines the morning of surgery with A SIP OF WATER: NONE    One week prior to surgery, STOP taking any Aspirin (unless otherwise instructed by your surgeon) Aleve, Naproxen, Ibuprofen, Motrin, Advil, Goody's, BC's, all herbal medications, fish oil, and non-prescription vitamins.                     Do NOT Smoke (Tobacco/Vaping) for 24 hours prior to your procedure.  If you use a CPAP at night, you may bring your mask/headgear for your overnight stay.   You will be asked to remove any contacts, glasses, piercing's, hearing aid's,  dentures/partials prior to surgery. Please bring cases for these items if needed.    Patients discharged the day of surgery will not be allowed to drive home, and someone needs to stay with them for 24 hours.  SURGICAL WAITING ROOM VISITATION Patients may have no more than 2 support people in the waiting area - these visitors may rotate.   Pre-op nurse will coordinate an appropriate time for 1 ADULT support person, who may not rotate, to accompany patient in pre-op.  Children under the age of 5 must have an adult with them who is not the patient and must remain in the main waiting area with an adult.  If the patient needs to stay at the hospital during part of their recovery, the visitor guidelines for inpatient rooms apply.  Please refer to the [facility] website for the visitor guidelines for any additional information.   If you received a COVID test during your pre-op visit  it is requested that you wear a mask when out in public, stay away from anyone that may not be feeling well and notify your surgeon if you develop symptoms. If you have been in contact with anyone that has tested positive in the last 10 days please notify you surgeon.      Pre-operative 5 CHG Bathing Instructions   You can play a key role in reducing the risk of infection after surgery. Your skin needs to be as free of germs as  possible. You can reduce the number of germs on your skin by washing with CHG (chlorhexidine  gluconate) soap before surgery. CHG is an antiseptic soap that kills germs and continues to kill germs even after washing.   DO NOT use if you have an allergy to chlorhexidine /CHG or antibacterial soaps. If your skin becomes reddened or irritated, stop using the CHG and notify one of our RNs at [phone].   Please shower with the CHG soap starting 4 days before surgery using the following schedule:     Please keep in mind the following:  DO NOT shave, including legs and underarms, starting the  day of your first shower.   You may shave your face at any point before/day of surgery.  Place clean sheets on your bed the day you start using CHG soap. Use a clean washcloth (not used since being washed) for each shower. DO NOT sleep with pets once you start using the CHG.   CHG Shower Instructions:  Wash your face and private area with normal soap. If you choose to wash your hair, wash first with your normal shampoo.  After you use shampoo/soap, rinse your hair and body thoroughly to remove shampoo/soap residue.  Turn the water OFF and apply about 3 tablespoons (45 ml) of CHG soap to a CLEAN washcloth.  Apply CHG soap ONLY FROM YOUR NECK DOWN TO YOUR TOES (washing for 3-5 minutes)  DO NOT use CHG soap on face, private areas, open wounds, or sores.  Pay special attention to the area where your surgery is being performed.  If you are having back surgery, having someone wash your back for you may be helpful. Wait 2 minutes after CHG soap is applied, then you may rinse off the CHG soap.  Pat dry with a clean towel  Put on clean clothes/pajamas   If you choose to wear lotion, please use ONLY the CHG-compatible lotions that are listed below.  Additional instructions for the day of surgery: DO NOT APPLY any lotions, deodorants, cologne, or perfumes.   Do not bring valuables to the hospital. [facility] is not responsible for any belongings/valuables. Do not wear nail polish, gel polish, artificial nails, or any other type of covering on natural nails (fingers and toes) Do not wear jewelry or makeup Put on clean/comfortable clothes.  Please brush your teeth.  Ask your nurse before applying any prescription medications to the skin.     CHG Compatible Lotions   Aveeno Moisturizing lotion  Cetaphil Moisturizing Cream  Cetaphil Moisturizing Lotion  Clairol Herbal Essence Moisturizing Lotion, Dry Skin  Clairol Herbal Essence Moisturizing Lotion, Extra Dry Skin  Clairol Herbal Essence  Moisturizing Lotion, Normal Skin  Curel Age Defying Therapeutic Moisturizing Lotion with Alpha Hydroxy  Curel Extreme Care Body Lotion  Curel Soothing Hands Moisturizing Hand Lotion  Curel Therapeutic Moisturizing Cream, Fragrance-Free  Curel Therapeutic Moisturizing Lotion, Fragrance-Free  Curel Therapeutic Moisturizing Lotion, Original Formula  Eucerin Daily Replenishing Lotion  Eucerin Dry Skin Therapy Plus Alpha Hydroxy Crme  Eucerin Dry Skin Therapy Plus Alpha Hydroxy Lotion  Eucerin Original Crme  Eucerin Original Lotion  Eucerin Plus Crme Eucerin Plus Lotion  Eucerin TriLipid Replenishing Lotion  Keri Anti-Bacterial Hand Lotion  Keri Deep Conditioning Original Lotion Dry Skin Formula Softly Scented  Keri Deep Conditioning Original Lotion, Fragrance Free Sensitive Skin Formula  Keri Lotion Fast Absorbing Fragrance Free Sensitive Skin Formula  Keri Lotion Fast Absorbing Softly Scented Dry Skin Formula  Keri Original Lotion  Keri Skin Renewal Lotion  Keri Silky Smooth Lotion  Keri Silky Smooth Sensitive Skin Lotion  Nivea Body Creamy Conditioning Patent Examiner Moisturizing Lotion Nivea Crme  Nivea Skin Firming Lotion  NutraDerm 30 Skin Lotion  NutraDerm Skin Lotion  NutraDerm Therapeutic Skin Cream  NutraDerm Therapeutic Skin Lotion  ProShield Protective Hand Cream  Provon moisturizing lotion  Please read over the following fact sheets that you were given.

## 2023-05-29 ENCOUNTER — Encounter (HOSPITAL_COMMUNITY)
Admission: RE | Admit: 2023-05-29 | Discharge: 2023-05-29 | Disposition: A | Discharge status: Home / Self Care | Payer: Medicare HMO | Source: Ambulatory Visit | Attending: Orthopedic Surgery | Admitting: Orthopedic Surgery

## 2023-05-29 ENCOUNTER — Other Ambulatory Visit: Payer: Self-pay

## 2023-05-29 ENCOUNTER — Encounter (HOSPITAL_COMMUNITY): Payer: Self-pay

## 2023-05-29 VITALS — BP 123/74 | HR 62 | Temp 97.9°F | Resp 18 | Ht 63.0 in | Wt 111.3 lb

## 2023-05-29 DIAGNOSIS — Z01812 Encounter for preprocedural laboratory examination: Secondary | ICD-10-CM | POA: Diagnosis present

## 2023-05-29 DIAGNOSIS — R9431 Abnormal electrocardiogram [ECG] [EKG]: Secondary | ICD-10-CM | POA: Insufficient documentation

## 2023-05-29 DIAGNOSIS — Z01818 Encounter for other preprocedural examination: Secondary | ICD-10-CM | POA: Diagnosis not present

## 2023-05-29 DIAGNOSIS — I10 Essential (primary) hypertension: Secondary | ICD-10-CM | POA: Insufficient documentation

## 2023-05-29 DIAGNOSIS — Z01810 Encounter for preprocedural cardiovascular examination: Secondary | ICD-10-CM | POA: Diagnosis present

## 2023-05-29 HISTORY — DX: Other specified postprocedural states: Z98.890

## 2023-05-29 HISTORY — DX: Nausea with vomiting, unspecified: R11.2

## 2023-05-29 LAB — BASIC METABOLIC PANEL
Anion gap: 11 (ref 5–15)
BUN: 20 mg/dL (ref 8–23)
CO2: 27 mmol/L (ref 22–32)
Calcium: 9.9 mg/dL (ref 8.9–10.3)
Chloride: 103 mmol/L (ref 98–111)
Creatinine, Ser: 0.82 mg/dL (ref 0.44–1.00)
GFR, Estimated: >60 mL/min (ref >60)
Glucose, Bld: 85 mg/dL (ref 70–99)
Potassium: 4.7 mmol/L (ref 3.5–5.1)
Sodium: 141 mmol/L (ref 135–145)

## 2023-05-29 LAB — CBC
HCT: 41.2 % (ref 36.0–46.0)
Hemoglobin: 13.9 g/dL (ref 12.0–15.0)
MCH: 33.2 pg (ref 26.0–34.0)
MCHC: 33.7 g/dL (ref 30.0–36.0)
MCV: 98.3 fL (ref 80.0–100.0)
Platelets: 258 10*3/uL (ref 150–400)
RBC: 4.19 MIL/uL (ref 3.87–5.11)
RDW: 12.6 % (ref 11.5–15.5)
WBC: 4.9 10*3/uL (ref 4.0–10.5)
nRBC: 0 % (ref 0.0–0.2)

## 2023-05-29 LAB — SURGICAL PCR SCREEN
MRSA, PCR: NEGATIVE
Staphylococcus aureus: NEGATIVE

## 2023-05-29 LAB — TYPE AND SCREEN
ABO/RH(D): O POS
Antibody Screen: NEGATIVE

## 2023-05-29 NOTE — Progress Notes (Signed)
 PCP - Dr. Glade Hope Cardiologist - denies  PPM/ICD - denies   Chest x-ray - 04/29/07 EKG - 05/29/23 Stress Test - denies ECHO - denies Cardiac Cath - denies  Sleep Study - denies   DM- denies  Last dose of GLP1 agonist-  n/a   ASA/Blood Thinner Instructions: n/a   ERAS Protcol - yes PRE-SURGERY Ensure given at PAT  COVID TEST- n/a   Anesthesia review: no  Patient denies shortness of breath, fever, cough and chest pain at PAT appointment   All instructions explained to the patient, with a verbal understanding of the material. Patient agrees to go over the instructions while at home for a better understanding.  The opportunity to ask questions was provided.

## 2023-06-04 ENCOUNTER — Other Ambulatory Visit: Payer: Self-pay

## 2023-06-04 ENCOUNTER — Inpatient Hospital Stay (HOSPITAL_COMMUNITY): Payer: Medicare HMO | Admitting: Certified Registered Nurse Anesthetist

## 2023-06-04 ENCOUNTER — Encounter (HOSPITAL_COMMUNITY)
Admission: RE | Disposition: A | Discharge status: Home / Self Care | Payer: Self-pay | Source: Home / Self Care | Attending: Orthopedic Surgery

## 2023-06-04 ENCOUNTER — Inpatient Hospital Stay (HOSPITAL_COMMUNITY): Payer: Medicare HMO

## 2023-06-04 ENCOUNTER — Encounter (HOSPITAL_COMMUNITY): Payer: Self-pay | Admitting: Orthopedic Surgery

## 2023-06-04 ENCOUNTER — Inpatient Hospital Stay (HOSPITAL_COMMUNITY)
Admission: RE | Admit: 2023-06-04 | Discharge: 2023-06-05 | DRG: 402 | Disposition: A | Discharge status: Home / Self Care | Payer: Medicare HMO | Attending: Orthopedic Surgery | Admitting: Orthopedic Surgery

## 2023-06-04 DIAGNOSIS — Z811 Family history of alcohol abuse and dependence: Secondary | ICD-10-CM

## 2023-06-04 DIAGNOSIS — M5416 Radiculopathy, lumbar region: Secondary | ICD-10-CM | POA: Diagnosis not present

## 2023-06-04 DIAGNOSIS — Z79899 Other long term (current) drug therapy: Secondary | ICD-10-CM | POA: Diagnosis not present

## 2023-06-04 DIAGNOSIS — G8929 Other chronic pain: Secondary | ICD-10-CM | POA: Diagnosis not present

## 2023-06-04 DIAGNOSIS — Z9689 Presence of other specified functional implants: Secondary | ICD-10-CM | POA: Diagnosis not present

## 2023-06-04 DIAGNOSIS — M48061 Spinal stenosis, lumbar region without neurogenic claudication: Secondary | ICD-10-CM

## 2023-06-04 DIAGNOSIS — Z885 Allergy status to narcotic agent status: Secondary | ICD-10-CM

## 2023-06-04 DIAGNOSIS — E041 Nontoxic single thyroid nodule: Secondary | ICD-10-CM | POA: Diagnosis not present

## 2023-06-04 DIAGNOSIS — Z803 Family history of malignant neoplasm of breast: Secondary | ICD-10-CM

## 2023-06-04 DIAGNOSIS — I10 Essential (primary) hypertension: Secondary | ICD-10-CM | POA: Diagnosis present

## 2023-06-04 DIAGNOSIS — Z860100 Personal history of colon polyps, unspecified: Secondary | ICD-10-CM | POA: Diagnosis not present

## 2023-06-04 DIAGNOSIS — Z818 Family history of other mental and behavioral disorders: Secondary | ICD-10-CM | POA: Diagnosis not present

## 2023-06-04 DIAGNOSIS — Z800 Family history of malignant neoplasm of digestive organs: Secondary | ICD-10-CM | POA: Diagnosis not present

## 2023-06-04 DIAGNOSIS — Z91030 Bee allergy status: Secondary | ICD-10-CM | POA: Diagnosis not present

## 2023-06-04 DIAGNOSIS — Z8249 Family history of ischemic heart disease and other diseases of the circulatory system: Secondary | ICD-10-CM

## 2023-06-04 DIAGNOSIS — R1310 Dysphagia, unspecified: Secondary | ICD-10-CM | POA: Diagnosis not present

## 2023-06-04 HISTORY — PX: ANTERIOR LAT LUMBAR FUSION: SHX1168

## 2023-06-04 HISTORY — PX: POSTERIOR FUSION PEDICLE SCREW PLACEMENT: SHX2186

## 2023-06-04 LAB — ABO/RH: ABO/RH(D): O POS

## 2023-06-04 SURGERY — ANTERIOR LATERAL LUMBAR FUSION 1 LEVEL
Anesthesia: General | Site: Spine Lumbar

## 2023-06-04 MED ORDER — SODIUM CHLORIDE 0.9% FLUSH: 3.0000 mL | INTRAVENOUS | Status: DC | PRN

## 2023-06-04 MED ORDER — SODIUM CHLORIDE 0.9 % IV SOLN: 12.5000 mg | INTRAVENOUS | Status: DC | PRN

## 2023-06-04 MED ORDER — ONDANSETRON HCL 4 MG/2ML IJ SOLN
INTRAMUSCULAR | Status: DC | PRN
  Administered 2023-06-04: 4 mg via INTRAVENOUS

## 2023-06-04 MED ORDER — HYDROCODONE-ACETAMINOPHEN 5-325 MG PO TABS: 1.0000 | ORAL_TABLET | ORAL | Status: DC | PRN

## 2023-06-04 MED ORDER — ZINC SULFATE 220 (50 ZN) MG PO CAPS
220.0000 mg | ORAL_CAPSULE | Freq: Every day | ORAL | Status: DC
  Filled 2023-06-04: qty 1

## 2023-06-04 MED ORDER — CHLORHEXIDINE GLUCONATE 0.12 % MT SOLN
15.0000 mL | Freq: Once | OROMUCOSAL | Status: AC
  Administered 2023-06-04: 15 mL via OROMUCOSAL
  Filled 2023-06-04: qty 15

## 2023-06-04 MED ORDER — PHENOL 1.4 % MT LIQD: 1.0000 | OROMUCOSAL | Status: DC | PRN

## 2023-06-04 MED ORDER — LACTATED RINGERS IV SOLN
INTRAVENOUS | Status: DC
Start: 2023-06-04 — End: 2023-06-04

## 2023-06-04 MED ORDER — DOCUSATE SODIUM 100 MG PO CAPS
100.0000 mg | ORAL_CAPSULE | Freq: Two times a day (BID) | ORAL | Status: DC
  Administered 2023-06-04: 100 mg via ORAL
  Filled 2023-06-04: qty 1

## 2023-06-04 MED ORDER — BUPIVACAINE LIPOSOME 1.3 % IJ SUSP
INTRAMUSCULAR | Status: AC
  Filled 2023-06-04: qty 20

## 2023-06-04 MED ORDER — HYDROMORPHONE HCL 1 MG/ML IJ SOLN
INTRAMUSCULAR | Status: AC
  Administered 2023-06-04: 0.5 mg via INTRAVENOUS
  Filled 2023-06-04: qty 1

## 2023-06-04 MED ORDER — PROPOFOL 10 MG/ML IV BOLUS
INTRAVENOUS | Status: DC | PRN
  Administered 2023-06-04 (×2): 30 mg via INTRAVENOUS
  Administered 2023-06-04: 120 mg via INTRAVENOUS

## 2023-06-04 MED ORDER — ORAL CARE MOUTH RINSE: 15.0000 mL | Freq: Once | OROMUCOSAL | Status: AC

## 2023-06-04 MED ORDER — BUPIVACAINE-EPINEPHRINE (PF) 0.25% -1:200000 IJ SOLN
INTRAMUSCULAR | Status: DC | PRN
  Administered 2023-06-04: 40 mL

## 2023-06-04 MED ORDER — SODIUM CHLORIDE 0.9% FLUSH
3.0000 mL | Freq: Two times a day (BID) | INTRAVENOUS | Status: DC
  Administered 2023-06-04: 3 mL via INTRAVENOUS

## 2023-06-04 MED ORDER — DEXAMETHASONE SODIUM PHOSPHATE 10 MG/ML IJ SOLN
INTRAMUSCULAR | Status: AC
  Filled 2023-06-04: qty 1

## 2023-06-04 MED ORDER — METHOCARBAMOL 500 MG PO TABS
500.0000 mg | ORAL_TABLET | Freq: Once | ORAL | Status: AC
  Administered 2023-06-04: 500 mg via ORAL

## 2023-06-04 MED ORDER — ONDANSETRON HCL 4 MG/2ML IJ SOLN
INTRAMUSCULAR | Status: AC
  Filled 2023-06-04: qty 2

## 2023-06-04 MED ORDER — EPINEPHRINE 0.3 MG/0.3ML IJ SOAJ: 0.3000 mg | INTRAMUSCULAR | Status: DC | PRN

## 2023-06-04 MED ORDER — OXYCODONE HCL 5 MG PO TABS: 5.0000 mg | ORAL_TABLET | Freq: Once | ORAL | Status: DC | PRN

## 2023-06-04 MED ORDER — FENTANYL CITRATE (PF) 250 MCG/5ML IJ SOLN
INTRAMUSCULAR | Status: AC
  Filled 2023-06-04: qty 5

## 2023-06-04 MED ORDER — AMISULPRIDE (ANTIEMETIC) 5 MG/2ML IV SOLN: 10.0000 mg | Freq: Once | INTRAVENOUS | Status: DC | PRN

## 2023-06-04 MED ORDER — EPHEDRINE SULFATE-NACL 50-0.9 MG/10ML-% IV SOSY
PREFILLED_SYRINGE | INTRAVENOUS | Status: DC | PRN
  Administered 2023-06-04: 5 mg via INTRAVENOUS
  Administered 2023-06-04: 10 mg via INTRAVENOUS

## 2023-06-04 MED ORDER — ADULT MULTIVITAMIN W/MINERALS CH: 1.0000 | ORAL_TABLET | Freq: Every day | ORAL | Status: DC

## 2023-06-04 MED ORDER — HYDROMORPHONE HCL 1 MG/ML IJ SOLN
INTRAMUSCULAR | Status: AC
Start: 2023-06-04 — End: 2023-06-05
  Filled 2023-06-04: qty 1

## 2023-06-04 MED ORDER — MENTHOL 3 MG MT LOZG: 1.0000 | LOZENGE | OROMUCOSAL | Status: DC | PRN

## 2023-06-04 MED ORDER — CEFAZOLIN SODIUM-DEXTROSE 2-4 GM/100ML-% IV SOLN
2.0000 g | INTRAVENOUS | Status: AC
  Administered 2023-06-04: 2 g via INTRAVENOUS
  Filled 2023-06-04: qty 100

## 2023-06-04 MED ORDER — PHENYLEPHRINE HCL-NACL 20-0.9 MG/250ML-% IV SOLN
INTRAVENOUS | Status: DC | PRN
  Administered 2023-06-04: 25 ug/min via INTRAVENOUS

## 2023-06-04 MED ORDER — LISINOPRIL 2.5 MG PO TABS
2.5000 mg | ORAL_TABLET | Freq: Every day | ORAL | Status: DC
  Filled 2023-06-04 (×2): qty 1

## 2023-06-04 MED ORDER — ACETAMINOPHEN 325 MG PO TABS: 650.0000 mg | ORAL_TABLET | ORAL | Status: DC | PRN

## 2023-06-04 MED ORDER — OYSTER SHELL CALCIUM/D3 500-5 MG-MCG PO TABS: 1.0000 | ORAL_TABLET | Freq: Every day | ORAL | Status: DC

## 2023-06-04 MED ORDER — ONDANSETRON HCL 4 MG PO TABS
4.0000 mg | ORAL_TABLET | Freq: Four times a day (QID) | ORAL | Status: DC | PRN
  Administered 2023-06-05: 4 mg via ORAL
  Filled 2023-06-04: qty 1

## 2023-06-04 MED ORDER — THROMBIN 20000 UNITS EX SOLR
CUTANEOUS | Status: AC
  Filled 2023-06-04: qty 20000

## 2023-06-04 MED ORDER — 0.9 % SODIUM CHLORIDE (POUR BTL) OPTIME
TOPICAL | Status: DC | PRN
  Administered 2023-06-04: 1000 mL

## 2023-06-04 MED ORDER — OXYCODONE-ACETAMINOPHEN 5-325 MG PO TABS
ORAL_TABLET | ORAL | Status: AC
  Filled 2023-06-04: qty 1

## 2023-06-04 MED ORDER — BUPIVACAINE-EPINEPHRINE (PF) 0.25% -1:200000 IJ SOLN
INTRAMUSCULAR | Status: AC
  Filled 2023-06-04: qty 30

## 2023-06-04 MED ORDER — METHOCARBAMOL 500 MG PO TABS
500.0000 mg | ORAL_TABLET | Freq: Four times a day (QID) | ORAL | Status: DC | PRN
  Administered 2023-06-04 – 2023-06-05 (×2): 500 mg via ORAL
  Filled 2023-06-04 (×2): qty 1

## 2023-06-04 MED ORDER — BUPIVACAINE LIPOSOME 1.3 % IJ SUSP: INTRAMUSCULAR | Status: DC | PRN

## 2023-06-04 MED ORDER — ALUM & MAG HYDROXIDE-SIMETH 200-200-20 MG/5ML PO SUSP: 30.0000 mL | Freq: Four times a day (QID) | ORAL | Status: DC | PRN

## 2023-06-04 MED ORDER — SODIUM CHLORIDE 0.9 % IV SOLN: 250.0000 mL | INTRAVENOUS | Status: DC

## 2023-06-04 MED ORDER — FENTANYL CITRATE (PF) 250 MCG/5ML IJ SOLN
INTRAMUSCULAR | Status: DC | PRN
  Administered 2023-06-04: 50 ug via INTRAVENOUS
  Administered 2023-06-04: 100 ug via INTRAVENOUS
  Administered 2023-06-04 (×2): 50 ug via INTRAVENOUS

## 2023-06-04 MED ORDER — THROMBIN 20000 UNITS EX SOLR
CUTANEOUS | Status: DC | PRN
  Administered 2023-06-04 (×2): 20 mL

## 2023-06-04 MED ORDER — METHOCARBAMOL 1000 MG/10ML IJ SOLN: 500.0000 mg | Freq: Four times a day (QID) | INTRAMUSCULAR | Status: DC | PRN

## 2023-06-04 MED ORDER — SUCCINYLCHOLINE CHLORIDE 200 MG/10ML IV SOSY
PREFILLED_SYRINGE | INTRAVENOUS | Status: DC | PRN
Start: 2023-06-04 — End: 2023-06-04
  Administered 2023-06-04: 100 mg via INTRAVENOUS

## 2023-06-04 MED ORDER — BISACODYL 5 MG PO TBEC: 5.0000 mg | DELAYED_RELEASE_TABLET | Freq: Every day | ORAL | Status: DC | PRN

## 2023-06-04 MED ORDER — VITAMIN C 500 MG PO TABS: 500.0000 mg | ORAL_TABLET | Freq: Every day | ORAL | Status: DC

## 2023-06-04 MED ORDER — OXYCODONE-ACETAMINOPHEN 5-325 MG PO TABS
1.0000 | ORAL_TABLET | ORAL | Status: DC | PRN
  Administered 2023-06-04 (×2): 2 via ORAL
  Administered 2023-06-04: 1 via ORAL
  Administered 2023-06-05 (×3): 2 via ORAL
  Filled 2023-06-04 (×5): qty 2

## 2023-06-04 MED ORDER — MIDAZOLAM HCL 2 MG/2ML IJ SOLN
INTRAMUSCULAR | Status: DC | PRN
  Administered 2023-06-04: 2 mg via INTRAVENOUS

## 2023-06-04 MED ORDER — SURGIFLO WITH THROMBIN (HEMOSTATIC MATRIX KIT) OPTIME
TOPICAL | Status: DC | PRN
  Administered 2023-06-04: 1

## 2023-06-04 MED ORDER — SUCCINYLCHOLINE CHLORIDE 200 MG/10ML IV SOSY
PREFILLED_SYRINGE | INTRAVENOUS | Status: AC
  Filled 2023-06-04: qty 10

## 2023-06-04 MED ORDER — CEFAZOLIN SODIUM-DEXTROSE 2-4 GM/100ML-% IV SOLN
2.0000 g | Freq: Three times a day (TID) | INTRAVENOUS | Status: AC
  Administered 2023-06-04 – 2023-06-05 (×2): 2 g via INTRAVENOUS
  Filled 2023-06-04 (×2): qty 100

## 2023-06-04 MED ORDER — ONDANSETRON HCL 4 MG/2ML IJ SOLN
4.0000 mg | Freq: Four times a day (QID) | INTRAMUSCULAR | Status: DC | PRN
  Administered 2023-06-04: 4 mg via INTRAVENOUS
  Filled 2023-06-04: qty 2

## 2023-06-04 MED ORDER — OXYCODONE HCL 5 MG/5ML PO SOLN: 5.0000 mg | Freq: Once | ORAL | Status: DC | PRN

## 2023-06-04 MED ORDER — LIDOCAINE 2% (20 MG/ML) 5 ML SYRINGE
INTRAMUSCULAR | Status: AC
  Filled 2023-06-04: qty 5

## 2023-06-04 MED ORDER — MORPHINE SULFATE (PF) 2 MG/ML IV SOLN
1.0000 mg | INTRAVENOUS | Status: DC | PRN
  Administered 2023-06-04: 1 mg via INTRAVENOUS
  Filled 2023-06-04: qty 1

## 2023-06-04 MED ORDER — FLEET ENEMA RE ENEM: 1.0000 | ENEMA | Freq: Once | RECTAL | Status: DC | PRN

## 2023-06-04 MED ORDER — BUPIVACAINE-EPINEPHRINE 0.25% -1:200000 IJ SOLN
INTRAMUSCULAR | Status: DC | PRN
  Administered 2023-06-04: 10 mL

## 2023-06-04 MED ORDER — PROPOFOL 10 MG/ML IV BOLUS
INTRAVENOUS | Status: AC
  Filled 2023-06-04: qty 20

## 2023-06-04 MED ORDER — MIDAZOLAM HCL 2 MG/2ML IJ SOLN
INTRAMUSCULAR | Status: AC
  Filled 2023-06-04: qty 2

## 2023-06-04 MED ORDER — ZOLPIDEM TARTRATE 5 MG PO TABS: 5.0000 mg | ORAL_TABLET | Freq: Every evening | ORAL | Status: DC | PRN

## 2023-06-04 MED ORDER — ACETAMINOPHEN 650 MG RE SUPP: 650.0000 mg | RECTAL | Status: DC | PRN

## 2023-06-04 MED ORDER — DEXAMETHASONE SODIUM PHOSPHATE 10 MG/ML IJ SOLN
INTRAMUSCULAR | Status: DC | PRN
  Administered 2023-06-04: 10 mg via INTRAVENOUS

## 2023-06-04 MED ORDER — PROPOFOL 500 MG/50ML IV EMUL
INTRAVENOUS | Status: DC | PRN
  Administered 2023-06-04: 50 ug/kg/min via INTRAVENOUS
  Administered 2023-06-04: 100 ug/kg/min via INTRAVENOUS

## 2023-06-04 MED ORDER — SODIUM CHLORIDE 0.9% FLUSH: 3.0000 mL | Freq: Two times a day (BID) | INTRAVENOUS | Status: DC

## 2023-06-04 MED ORDER — METHOCARBAMOL 500 MG PO TABS
ORAL_TABLET | ORAL | Status: AC
  Filled 2023-06-04: qty 1

## 2023-06-04 MED ORDER — HYDROMORPHONE HCL 1 MG/ML IJ SOLN
0.2500 mg | INTRAMUSCULAR | Status: DC | PRN
  Administered 2023-06-04: 0.5 mg via INTRAVENOUS

## 2023-06-04 MED ORDER — SENNOSIDES-DOCUSATE SODIUM 8.6-50 MG PO TABS: 1.0000 | ORAL_TABLET | Freq: Every evening | ORAL | Status: DC | PRN

## 2023-06-04 MED ORDER — LIDOCAINE 2% (20 MG/ML) 5 ML SYRINGE
INTRAMUSCULAR | Status: DC | PRN
  Administered 2023-06-04: 40 mg via INTRAVENOUS

## 2023-06-04 MED ORDER — GLYCOPYRROLATE PF 0.2 MG/ML IJ SOSY
PREFILLED_SYRINGE | INTRAMUSCULAR | Status: DC | PRN
  Administered 2023-06-04: .2 mg via INTRAVENOUS

## 2023-06-04 SURGICAL SUPPLY — 90 items
BAG COUNTER SPONGE SURGICOUNT (BAG) ×6 IMPLANT
BENZOIN TINCTURE PRP APPL 2/3 (GAUZE/BANDAGES/DRESSINGS) ×3 IMPLANT
BLADE CLIPPER SURG (BLADE) IMPLANT
BLADE SURG 10 STRL SS (BLADE) ×3 IMPLANT
BUR PRESCISION 1.7 ELITE (BURR) ×3 IMPLANT
BUR ROUND FLUTED 5 RND (BURR) ×3 IMPLANT
BUR ROUND PRECISION 4.0 (BURR) IMPLANT
BUR SABER RD CUTTING 3.0 (BURR) IMPLANT
CNTNR URN SCR LID CUP LEK RST (MISCELLANEOUS) ×3 IMPLANT
COVER BACK TABLE 80X110 HD (DRAPES) ×3 IMPLANT
COVER MAYO STAND STRL (DRAPES) ×6 IMPLANT
COVER SURGICAL LIGHT HANDLE (MISCELLANEOUS) ×6 IMPLANT
DRAIN CHANNEL 15F RND FF W/TCR (WOUND CARE) IMPLANT
DRAPE C-ARM 42X72 X-RAY (DRAPES) ×6 IMPLANT
DRAPE C-ARMOR (DRAPES) ×3 IMPLANT
DRAPE POUCH INSTRU U-SHP 10X18 (DRAPES) ×6 IMPLANT
DRAPE SURG 17X23 STRL (DRAPES) ×24 IMPLANT
DURAPREP 26ML APPLICATOR (WOUND CARE) ×6 IMPLANT
ELECT BLADE 4.0 EZ CLEAN MEGAD (MISCELLANEOUS) ×2 IMPLANT
ELECT BLADE 6.5 EXT (BLADE) ×3 IMPLANT
ELECT CAUTERY BLADE 6.4 (BLADE) ×6 IMPLANT
ELECT NVM5 SURFACE MEP/EMG (ELECTRODE) IMPLANT
ELECT REM PT RETURN 9FT ADLT (ELECTROSURGICAL) ×4 IMPLANT
ELECTRODE BLDE 4.0 EZ CLN MEGD (MISCELLANEOUS) ×3 IMPLANT
ELECTRODE REM PT RTRN 9FT ADLT (ELECTROSURGICAL) ×6 IMPLANT
EVACUATOR SILICONE 100CC (DRAIN) IMPLANT
FILTER STRAW FLUID ASPIR (MISCELLANEOUS) ×3 IMPLANT
GAUZE 4X4 16PLY ~~LOC~~+RFID DBL (SPONGE) ×6 IMPLANT
GAUZE SPONGE 4X4 12PLY STRL (GAUZE/BANDAGES/DRESSINGS) ×3 IMPLANT
GLOVE BIO SURGEON STRL SZ 6.5 (GLOVE) ×9 IMPLANT
GLOVE BIO SURGEON STRL SZ8 (GLOVE) ×6 IMPLANT
GLOVE BIOGEL PI IND STRL 7.0 (GLOVE) ×6 IMPLANT
GLOVE BIOGEL PI IND STRL 8 (GLOVE) ×6 IMPLANT
GLOVE SURG ENC MOIS LTX SZ6.5 (GLOVE) ×9 IMPLANT
GOWN STRL REUS W/ TWL LRG LVL3 (GOWN DISPOSABLE) ×9 IMPLANT
GOWN STRL REUS W/ TWL XL LVL3 (GOWN DISPOSABLE) ×9 IMPLANT
GUIDEWIRE SHARP VIPER II (WIRE) IMPLANT
IV CATH 14GX2 1/4 (CATHETERS) ×6 IMPLANT
KIT ALARA NEURO ACCESS (KITS) IMPLANT
KIT BASIN OR (CUSTOM PROCEDURE TRAY) ×6 IMPLANT
KIT DILATOR XLIF 5 (KITS) IMPLANT
KIT POSITION SURG JACKSON T1 (MISCELLANEOUS) ×3 IMPLANT
KIT SURGICAL ACCESS MAXCESS (KITS) IMPLANT
KIT TURNOVER KIT B (KITS) ×6 IMPLANT
MARKER SKIN DUAL TIP RULER LAB (MISCELLANEOUS) ×9 IMPLANT
MIX DBX 10CC 35% BONE (Bone Implant) IMPLANT
MODULE EMG NDL SSEP NVM5 (NEUROSURGERY SUPPLIES) IMPLANT
MODULE EMG NEEDLE SSEP NVM5 (NEUROSURGERY SUPPLIES) ×2 IMPLANT
NDL 18GX1X1/2 (RX/OR ONLY) (NEEDLE) ×3 IMPLANT
NDL 22X1.5 STRL (OR ONLY) (MISCELLANEOUS) ×6 IMPLANT
NDL HYPO 25GX1X1/2 BEV (NEEDLE) ×6 IMPLANT
NDL SPNL 18GX3.5 QUINCKE PK (NEEDLE) ×9 IMPLANT
NEEDLE 18GX1X1/2 (RX/OR ONLY) (NEEDLE) ×2 IMPLANT
NEEDLE 22X1.5 STRL (OR ONLY) (MISCELLANEOUS) ×4 IMPLANT
NEEDLE HYPO 25GX1X1/2 BEV (NEEDLE) ×4 IMPLANT
NEEDLE SPNL 18GX3.5 QUINCKE PK (NEEDLE) ×6 IMPLANT
NS IRRIG 1000ML POUR BTL (IV SOLUTION) ×9 IMPLANT
PACK LAMINECTOMY ORTHO (CUSTOM PROCEDURE TRAY) ×6 IMPLANT
PACK UNIVERSAL I (CUSTOM PROCEDURE TRAY) ×6 IMPLANT
PAD ARMBOARD 7.5X6 YLW CONV (MISCELLANEOUS) ×12 IMPLANT
PATTIES SURGICAL .5 X1 (DISPOSABLE) ×3 IMPLANT
PATTIES SURGICAL .5X1.5 (GAUZE/BANDAGES/DRESSINGS) ×3 IMPLANT
PUTTY BONE DBX 2.5 MIS (Bone Implant) IMPLANT
PUTTY DBX 2.5CC (Putty) ×2 IMPLANT
PUTTY DBX 2.5CC DEPUY (Putty) IMPLANT
ROD VIPER II LORDOSED 5.5X40 (Rod) IMPLANT
SCREW SET SINGLE INNER MIS (Screw) IMPLANT
SCREW XTAB POLY VIPER 7X45 (Screw) IMPLANT
SPACER RISE-L 18X50 7-14MM (Spacer) IMPLANT
SPONGE INTESTINAL PEANUT (DISPOSABLE) ×9 IMPLANT
SPONGE SURGIFOAM ABS GEL 100 (HEMOSTASIS) ×3 IMPLANT
SPONGE T-LAP 4X18 ~~LOC~~+RFID (SPONGE) ×3 IMPLANT
STAPLER VISISTAT 35W (STAPLE) ×3 IMPLANT
STRIP CLOSURE SKIN 1/2X4 (GAUZE/BANDAGES/DRESSINGS) ×6 IMPLANT
SURGIFLO W/THROMBIN 8M KIT (HEMOSTASIS) IMPLANT
SUT MNCRL AB 4-0 PS2 18 (SUTURE) ×6 IMPLANT
SUT VIC AB 0 CT1 18XCR BRD 8 (SUTURE) ×6 IMPLANT
SUT VIC AB 1 CT1 18XCR BRD 8 (SUTURE) ×6 IMPLANT
SUT VIC AB 2-0 CT2 18 VCP726D (SUTURE) ×6 IMPLANT
SYR 20ML LL LF (SYRINGE) ×6 IMPLANT
SYR BULB IRRIG 60ML STRL (SYRINGE) ×6 IMPLANT
SYR CONTROL 10ML LL (SYRINGE) ×9 IMPLANT
SYR TB 1ML LUER SLIP (SYRINGE) ×3 IMPLANT
TAP CANN VIPER2 DL 6.0 (TAP) IMPLANT
TAPE CLOTH 4X10 WHT NS (GAUZE/BANDAGES/DRESSINGS) IMPLANT
TOWEL GREEN STERILE (TOWEL DISPOSABLE) ×3 IMPLANT
TOWEL GREEN STERILE FF (TOWEL DISPOSABLE) ×3 IMPLANT
TRAY FOLEY MTR SLVR 16FR STAT (SET/KITS/TRAYS/PACK) ×6 IMPLANT
WATER STERILE IRR 1000ML POUR (IV SOLUTION) ×6 IMPLANT
YANKAUER SUCT BULB TIP NO VENT (SUCTIONS) ×6 IMPLANT

## 2023-06-04 NOTE — Anesthesia Procedure Notes (Addendum)
Procedure Name: Intubation Date/Time: 06/04/2023 8:54 AM  Performed by: Shary Decamp, CRNAPre-anesthesia Checklist: Patient identified, Patient being monitored, Timeout performed, Emergency Drugs available and Suction available Patient Re-evaluated:Patient Re-evaluated prior to induction Oxygen Delivery Method: Circle System Utilized Preoxygenation: Pre-oxygenation with 100% oxygen Induction Type: IV induction Ventilation: Mask ventilation without difficulty Laryngoscope Size: Miller and 2 Grade View: Grade I Tube type: Oral Tube size: 7.0 mm Number of attempts: 1 Airway Equipment and Method: Stylet Placement Confirmation: ETT inserted through vocal cords under direct vision, positive ETCO2 and breath sounds checked- equal and bilateral Secured at: 21 cm Tube secured with: Tape Dental Injury: Teeth and Oropharynx as per pre-operative assessment

## 2023-06-04 NOTE — Transfer of Care (Signed)
Immediate Anesthesia Transfer of Care Note  Patient: Margaret Craig  Procedure(s) Performed: LEFT-SIDED LATERAL INTERBODY FUSION LUMBAR FOUR - LUMBAR FIVE WITH INSTRUMENTATION AND ALLOGRAFT (Left: Spine Lumbar) POSTERIOR SPINAL FUSION LUMBAR FOUR - LUMBAR FIVE WITH INSTRUMENTATION AND ALLOGRAFT (Spine Lumbar)  Patient Location: PACU  Anesthesia Type:General  Level of Consciousness: drowsy, patient cooperative, and responds to stimulation  Airway & Oxygen Therapy: Patient Spontanous Breathing and Patient connected to face mask oxygen  Post-op Assessment: Report given to RN, Post -op Vital signs reviewed and stable, and Patient moving all extremities X 4  Post vital signs: Reviewed and stable  Last Vitals:  Vitals Value Taken Time  BP 135/93 06/04/23 1216  Temp 36.4 C 06/04/23 1215  Pulse 89 06/04/23 1222  Resp 31 06/04/23 1222  SpO2 84 % 06/04/23 1222  Vitals shown include unfiled device data.  Last Pain:  Vitals:   06/04/23 0706  TempSrc:   PainSc: 7       Patients Stated Pain Goal: 2 (06/04/23 0706)  Complications: No notable events documented.

## 2023-06-04 NOTE — Anesthesia Preprocedure Evaluation (Signed)
Anesthesia Evaluation    History of Anesthesia Complications (+) PONV and history of anesthetic complications  Airway Mallampati: II  TM Distance: >3 FB Neck ROM: Full    Dental no notable dental hx.    Pulmonary    Pulmonary exam normal breath sounds clear to auscultation       Cardiovascular hypertension, Pt. on medications Normal cardiovascular exam Rhythm:Regular Rate:Normal     Neuro/Psych    GI/Hepatic   Endo/Other    Renal/GU      Musculoskeletal  (+) Arthritis , Osteoarthritis,    Abdominal   Peds  Hematology   Anesthesia Other Findings   Reproductive/Obstetrics                             Anesthesia Physical Anesthesia Plan  ASA: 2  Anesthesia Plan: General   Post-op Pain Management:    Induction: Intravenous  PONV Risk Score and Plan: 4 or greater and Ondansetron, Dexamethasone, Midazolam, Droperidol and Treatment may vary due to age or medical condition  Airway Management Planned: Oral ETT  Additional Equipment:   Intra-op Plan:   Post-operative Plan: Extubation in OR  Informed Consent: I have reviewed the patients History and Physical, chart, labs and discussed the procedure including the risks, benefits and alternatives for the proposed anesthesia with the patient or authorized representative who has indicated his/her understanding and acceptance.     Dental advisory given  Plan Discussed with: CRNA  Anesthesia Plan Comments:        Anesthesia Quick Evaluation

## 2023-06-04 NOTE — H&P (Signed)
PREOPERATIVE H&P  Chief Complaint: Right leg pain  HPI: Margaret Craig is a 78 y.o. female who presents with ongoing pain in the right leg  MRI reveals severe stenosis at L4/5 and prominent right-sided segmental collapse at L4/5   Patient has failed multiple forms of conservative care and continues to have pain (see office notes for additional details regarding the patient's full course of treatment)  Past Medical History:  Diagnosis Date   Allergy    Arthritis    Colon polyps    Fainting spell    Hypertension    PONV (postoperative nausea and vomiting)    Thyroid disease    UTI (urinary tract infection)    Past Surgical History:  Procedure Laterality Date   COLONOSCOPY     SINUS IRRIGATION     TONSILLECTOMY     Social History   Socioeconomic History   Marital status: Widowed    Spouse name: Not on file   Number of children: 2   Years of education: Not on file   Highest education level: Not on file  Occupational History   Not on file  Tobacco Use   Smoking status: Never   Smokeless tobacco: Never  Vaping Use   Vaping status: Never Used  Substance and Sexual Activity   Alcohol use: Yes    Alcohol/week: 2.0 standard drinks of alcohol    Types: 2 Glasses of wine per week    Comment: 2-3 glasses per week   Drug use: Never   Sexual activity: Not on file  Other Topics Concern   Not on file  Social History Narrative   1 child deceased. 2 living   Social Drivers of Corporate investment banker Strain: Low Risk  (09/17/2022)   Overall Financial Resource Strain (CARDIA)    Difficulty of Paying Living Expenses: Not hard at all  Food Insecurity: No Food Insecurity (09/17/2022)   Hunger Vital Sign    Worried About Running Out of Food in the Last Year: Never true    Ran Out of Food in the Last Year: Never true  Transportation Needs: No Transportation Needs (09/17/2022)   PRAPARE - Administrator, Civil Service (Medical): No    Lack of  Transportation (Non-Medical): No  Physical Activity: Sufficiently Active (09/17/2022)   Exercise Vital Sign    Days of Exercise per Week: 7 days    Minutes of Exercise per Session: 30 min  Stress: No Stress Concern Present (09/17/2022)   [employer] of Occupational Health - Occupational Stress Questionnaire    Feeling of Stress : Not at all  Social Connections: Socially Integrated (09/17/2022)   Social Connection and Isolation Panel [NHANES]    Frequency of Communication with Friends and Family: More than three times a week    Frequency of Social Gatherings with Friends and Family: More than three times a week    Attends Religious Services: More than 4 times per year    Active Member of [employer] or Organizations: Yes    Attends Engineer, structural: More than 4 times per year    Marital Status: Married   Family History  Problem Relation Age of Onset   Mental illness Mother    Colon cancer Mother 97   Cancer Maternal Grandmother        breast   Heart disease Maternal Grandfather    Alcohol abuse Son    Mental illness Son    Heart disease Maternal Aunt  Colon cancer Maternal Aunt        diagnosed in her 63s   Breast cancer Maternal Aunt    Heart disease Paternal Aunt    Alcohol abuse Paternal Uncle    Esophageal cancer Neg Hx    Stomach cancer Neg Hx    Rectal cancer Neg Hx    Allergies  Allergen Reactions   Wasp Venom Anaphylaxis   Codeine     Can't think straight, insomnia    Prior to Admission medications   Medication Sig Start Date End Date Taking? Authorizing Provider  ascorbic acid (VITAMIN C) 500 MG tablet Take 500 mg by mouth daily.   Yes [provider]  Calcium Citrate-Vitamin D (CALCIUM + D PO) Take 1 tablet by mouth daily.   Yes [provider]  EPINEPHrine 0.3 mg/0.3 mL IJ SOAJ injection Inject 0.3 mg into the muscle as needed for anaphylaxis. 07/04/22  Yes Burns, Bobette Mo, MD  ibuprofen (ADVIL) 200 MG tablet Take 400 mg by mouth 2  (two) times daily.   Yes [provider]  lisinopril (ZESTRIL) 5 MG tablet TAKE 1/2 TABLET BY MOUTH DAILY 05/08/23  Yes Burns, Bobette Mo, MD  Multiple Vitamins-Minerals (MULTIVITAMIN WITH MINERALS) tablet Take 1 tablet by mouth daily.   Yes [provider]  Zinc 30 MG CAPS Take 30 mg by mouth daily.   Yes [provider]     All other systems have been reviewed and were otherwise negative with the exception of those mentioned in the HPI and as above.  Physical Exam: Vitals:   06/04/23 0723 06/04/23 0736  BP: (!) 164/96 (!) 154/92  Pulse:    Resp:    Temp:    SpO2:      Body mass index is 19.49 kg/m.  General: Alert, no acute distress Cardiovascular: No pedal edema Respiratory: No cyanosis, no use of accessory musculature Skin: No lesions in the area of chief complaint Neurologic: Sensation intact distally Psychiatric: Patient is competent for consent with normal mood and affect Lymphatic: No axillary or cervical lymphadenopathy   Assessment/Plan: LUMBAR RADICULOPATHY Plan for Procedure(s): LEFT-SIDED LATERAL INTERBODY FUSION LUMBAR 4 - LUMBAR 5 WITH INSTRUMENTATION AND ALLOGRAFT POSTERIOR SPINAL FUSION LUMBAR 4 - LUMBAR 5 WITH INSTRUMENTATION AND ALLOGRAFT   Jackelyn Hoehn, MD 06/04/2023 7:39 AM

## 2023-06-04 NOTE — Op Note (Signed)
NAME: Margaret Craig [facility]   MEDICAL RECORD NO: [id number]   DATE OF BIRTH: 31-Dec-1945   DATE OF PROCEDURE:  06/04/2023                                                    OPERATIVE REPORT     PREOPERATIVE DIAGNOSES: 1.  Right-sided L4 radiculopathy 2.  Right-sided L4/5 segmental collapse 3.  L4/5 spinal stenosis   POSTOPERATIVE DIAGNOSES: 1.  Right-sided L4 radiculopathy 2.  Right-sided L4/5 segmental collapse 3.  L4/5 spinal stenosis   PROCEDURE:    1.  Left-sided lateral interbody fusion via a direct lateral retroperitoneal approach. 2.  Insertion of interbody device x1 (Globus expandable intervertebral spacer). 3.  Use of morselized allograft -- DBX mix.  4.  Posterior spinal fusion, L4/5 5.  Placement of posterior instrumentation, L4/5  6.  Intraoperative use of fluoroscopy.   SURGEON:  Estill Bamberg, MD   ASSISTANT:  Jason Coop PA-C.   ANESTHESIA:  General endotracheal anesthesia.   COMPLICATIONS:  None.   DISPOSITION:  Stable.   ESTIMATED BLOOD LOSS:  Minimal.   INDICATIONS:  Briefly, Margaret Craig is a very pleasant 78 year-old female who did present to me with ongoing chronic pain in the right leg. MRI and Xrays were as noted above. Given the patient's ongoing symptoms, and lack of improvement with appropriate conservative treatment measures, I did discuss proceeding with the procedure reflected above. The patient was fully aware of the risks and limitations of surgery, and did wish to proceed.   DESCRIPTION OF PROCEDURE:  On 06/04/2023 the patient was brought to surgery and general endotracheal anesthesia was administered.  The patient was placed in the lateral decubitus position, with the left side up.  Neurologic monitoring leads were placed by the monitoring technician.  The patient's torso and lower extremities were secured to the bed.  The patient's hips and knees were flexed in order to lessen the tension on the psoas musculature.  The left flank was then  prepped and draped in the usual sterile fashion.  The bed was slightly flexed, in order to optimize exposure to the L4-5 intervertebral space. After a timeout procedure was performed, a left-sided transverse incision was made over the left flank overlying the L4-5 intervertebral space.  I did dissect just above the iliac crest, and through the external and internal oblique musculature, and transversalis musculature and fascia.  The retroperitoneal space was encountered.  The peritoneum was bluntly swept anteriorly, and the psoas was readily identified.  I did use a series of dilators to dock over the L4-5 intervertebral space.  I did use neurologic monitoring while placing the dilators, in order to ensure that there were no neurologic structures in the immediate vicinity of the dilators.  The lumbar plexus was noted to be posterior.  A self-retaining retractor was placed, and was attached to a rigid arm.  The retractor was very gently dilated and a shim was placed into the lower aspect of the L4/5 intertebral space.  I then used a knife to perform an annulotomy at the lateral aspect of the L4-5 intervertebral space.  I then used a series of curettes and pituitary rongeurs in order to perform a thorough and complete L4-5 intervertebral diskectomy.  The contralateral annulus was released.  I then placed a series of intervertebral spacer trials, and  I did feel that an 18 x 50 mm spacer would be the most appropriate fit. The appropriate spacer was then packed with DBX mix and tamped into position, and expanded to 11.2 mm in height, after which point additional allograft was packed into the intervertebral spacer.  I was very pleased with the final resting position of the intervertebral spacer. I was very pleased with the final AP and lateral fluoroscopic images, as excellent restoration of disk height identified on both AP and lateral images was noted. At this point, the wound was copiously irrigated.  The fascia,  internal, and external oblique musculature was closed using #1 Vicryl.  The subcutaneous layer was  closed using 2-0 Vicryl and the skin was closed using 4-0 Monocryl.  Benzoin and Steri-Strips were applied followed by sterile dressing.    At this point, I marked out the L4 and L5 pedicles on the left, using AP and lateral fluoroscopy.  A left-sided paramedian incision was made, just lateral to the L4 and L5 pedicles.  The posterior lateral gutter was exposed and decorticated using a high-speed bur.  The remainder of the vision was then packed into the posterior lateral gutter.  I then cannulated the L4 and L5 pedicles using Jamshidi needles, after which point guidewires were placed.  7 x 45 mm screws were then advanced over the guidewires into the L4 and L5 pedicles.  A 40 mm rod was secured into the tulip heads of the screws. Caps were then placed in a final locking procedure was performed.  I was very pleased with the final AP and lateral fluoroscopic images. The subcutaneous layer was then closed using 2-0 Vicryl and the skin was closed using 4-0 Monocryl.  Benzoin and Steri-Strips were applied followed by sterile dressing.      Of note, I  did use neurologic monitoring throughout the entire surgery, and there was no sustained EMG activity noted throughout the entire surgery. All instrument counts were correct at the termination of the procedure.   Of note, Jason Coop was my assistant throughout surgery, and did aid in retraction, suctioning, and closure.  Estill Bamberg, MD

## 2023-06-04 NOTE — Anesthesia Postprocedure Evaluation (Signed)
Anesthesia Post Note  Patient: Hannia Matchett Zenz  Procedure(s) Performed: LEFT-SIDED LATERAL INTERBODY FUSION LUMBAR FOUR - LUMBAR FIVE WITH INSTRUMENTATION AND ALLOGRAFT (Left: Spine Lumbar) POSTERIOR SPINAL FUSION LUMBAR FOUR - LUMBAR FIVE WITH INSTRUMENTATION AND ALLOGRAFT (Spine Lumbar)     Patient location during evaluation: PACU Anesthesia Type: General Level of consciousness: awake and alert Pain management: pain level controlled Vital Signs Assessment: post-procedure vital signs reviewed and stable Respiratory status: spontaneous breathing, nonlabored ventilation and respiratory function stable Cardiovascular status: blood pressure returned to baseline and stable Postop Assessment: no apparent nausea or vomiting Anesthetic complications: no   No notable events documented.  Last Vitals:  Vitals:   06/04/23 1230 06/04/23 1245  BP: 125/89 113/83  Pulse: 78 71  Resp: 20 15  Temp:    SpO2: 100% 95%    Last Pain:  Vitals:   06/04/23 1245  TempSrc:   PainSc: 10-Worst pain ever                 Lowella Curb

## 2023-06-05 MED ORDER — OXYCODONE-ACETAMINOPHEN 5-325 MG PO TABS
1.0000 | ORAL_TABLET | ORAL | 0 refills | Status: DC | PRN
Start: 2023-06-05 — End: 2023-12-05

## 2023-06-05 MED ORDER — TIZANIDINE HCL 2 MG PO TABS: 2.0000 mg | ORAL_TABLET | Freq: Four times a day (QID) | ORAL | 2 refills | Status: DC | PRN

## 2023-06-05 NOTE — Plan of Care (Signed)
Pt doing well. Pt given D/C instructions with verbal understanding. Rx's were sent to the pharmacy by MD. Pt's incision is clean and dry with no sign of infection. Pt's IV was removed prior to D/C. Pt received RW from [employer] per MD order. Pt D/C'd home via wheelchair per MD order. Pt is stable @ D/C and has no other needs at this time. Rema Fendt, RN

## 2023-06-05 NOTE — Progress Notes (Signed)
    Patient doing well Has been ambulating well Has completed OT and PT   Physical Exam: Vitals:   06/05/23 0005 06/05/23 0450  BP: 127/86 139/86  Pulse: 66 (!) 50  Resp: 16 18  Temp: 97.6 F (36.4 C) 97.9 F (36.6 C)  SpO2: 100% 99%    Dressing in place NVI (full DF, PF, HF and KE strengthg on MMT)  POD #1 s/p L4/5 lateral/posterior fusion, doing well  - up with PT/OT, encourage ambulation - Percocet for pain, Tizanidine for muscle spasms - d/c home today with f/u in 2 weeks

## 2023-06-05 NOTE — Evaluation (Signed)
Occupational Therapy Evaluation Patient Details Name: Margaret Craig MRN: [id number] DOB: 16-Feb-1946 Today's Date: 06/05/2023   History of Present Illness   Margaret Craig is a 78 y.o. female who presents with ongoing pain in the R leg. MRI reveals stenosis at L4-5 and prominent right-sided segmental collapse at L4-5. Underwent surgical procedure on 06/04/2023: Left-sided lateral interbody fusion at L4-5; posterior spinal fusion at L4-5. PMHx includes arthritis, HTN, PONV, thyroid disease, fainting spell, and colon polyps.     Clinical Impressions Pt was evaluated s/p admission list above. At baseline was lives alone, drives, and completed all ADLs/IADLs, yard work independently without use of DME. Upon evaluation, pt was limited by LLE pain, LLE weakness, knowledge of precautions, compensatory strategies, and activity tolerance. Pt educated on back precautions and compensatory strategies to safely perform functional mobility and ADLs. Pt verbalized understanding. After education, pt was able to complete functional mobility tasks with up to CGA, increased time, and verbal cues for sequencing. Pt was able to complete upper body and lower body dressing tasks with up to MIN A due to LLE pain. Pt has supportive family that will assist pt once discharged home. Pt does not have acute OT needs and recommends pt discharge home with family support without follow-up OT services.     If plan is discharge home, recommend the following:   A little help with walking and/or transfers;A little help with bathing/dressing/bathroom;Assistance with cooking/housework;Assist for transportation;Help with stairs or ramp for entrance     Functional Status Assessment   Patient has had a recent decline in their functional status and demonstrates the ability to make significant improvements in function in a reasonable and predictable amount of time.     Equipment Recommendations   Other (comment)  (RW?)     Recommendations for Other Services         Precautions/Restrictions   Precautions Precautions: Back;Fall Precaution Booklet Issued: Yes (comment) Recall of Precautions/Restrictions: Intact Precaution/Restrictions Comments: Pt provided with handout and reviewed precautions with pt. Required Braces or Orthoses: Spinal Brace Spinal Brace: Thoracolumbosacral orthotic Restrictions Weight Bearing Restrictions Per Provider Order: No     Mobility Bed Mobility Overal bed mobility: Needs Assistance Bed Mobility: Rolling, Sidelying to Sit, Sit to Sidelying Rolling: Contact guard assist Sidelying to sit: Contact guard assist     Sit to sidelying: Contact guard assist General bed mobility comments: Pt educated on log rolling technique. Pt required increased time and verbal cues for sequencing. CGA provided for safety.    Transfers Overall transfer level: Needs assistance Equipment used: Rolling walker (2 wheels) Transfers: Sit to/from Stand Sit to Stand: Contact guard assist           General transfer comment: Pt performed sit to stand transfer from EOB to RW with CGA for safety due to reports of buckeling of LLE with mobility. Pt educated on B hand placement.      Balance Overall balance assessment: Needs assistance Sitting-balance support: No upper extremity supported, Feet supported Sitting balance-Leahy Scale: Good Sitting balance - Comments: completed lower body dressing sitting EOB using figure-4 method   Standing balance support: No upper extremity supported, During functional activity Standing balance-Leahy Scale: Fair Standing balance comment: completed clothing management while standing unsupported                           ADL either performed or assessed with clinical judgement   ADL Overall ADL's : Needs assistance/impaired Eating/Feeding: Independent  Grooming: Contact guard assist;Cueing for safety;Standing   Upper Body  Bathing: Supervision/ safety;Cueing for safety;Sitting   Lower Body Bathing: Cueing for compensatory techniques;Cueing for back precautions;Sitting/lateral leans;Minimal assistance   Upper Body Dressing : Minimal assistance;Sitting;Cueing for sequencing Upper Body Dressing Details (indicate cue type and reason): Assistance provided for donning TLSO Lower Body Dressing: Minimal assistance;Adhering to back precautions;Sit to/from stand;Cueing for safety Lower Body Dressing Details (indicate cue type and reason): Increased pain in LLE Toilet Transfer: Contact guard assist;Ambulation;Regular Toilet;Rolling walker (2 wheels)   Toileting- Clothing Manipulation and Hygiene: Contact guard assist;Cueing for safety;Cueing for back precautions;Sit to/from stand;Sitting/lateral lean   Tub/ Shower Transfer: Contact guard assist;Adhering to back precautions;Cueing for safety;Ambulation;Rolling walker (2 wheels);Shower seat   Functional mobility during ADLs: Contact guard assist;Cueing for safety;Rolling walker (2 wheels);Cueing for sequencing General ADL Comments: Pt with increased pain in LLE after recent procedure. Pt able to achieve figure-4 but MIN A provided to don shoe onto L foot due to pain with compensatory technique. CGA provided while standing due to reports of weakness and buckeling of LLE. Verbal cues for sequencing provided during functional mobility to ensure pt adhered to back precautions.     Vision Baseline Vision/History: 0 No visual deficits Ability to See in Adequate Light: 0 Adequate Patient Visual Report: No change from baseline Vision Assessment?: No apparent visual deficits     Perception Perception: Within Functional Limits       Praxis Praxis: Not tested       Pertinent Vitals/Pain Pain Assessment Pain Assessment: Faces Faces Pain Scale: Hurts whole lot Pain Location: LLE Pain Descriptors / Indicators: Aching, Guarding, Grimacing Pain Intervention(s): Limited  activity within patient's tolerance, Monitored during session     Extremity/Trunk Assessment Upper Extremity Assessment Upper Extremity Assessment: Overall WFL for tasks assessed   Lower Extremity Assessment Lower Extremity Assessment: Defer to PT evaluation   Cervical / Trunk Assessment Cervical / Trunk Assessment: Back Surgery   Communication Communication Communication: No apparent difficulties   Cognition Arousal: Alert Behavior During Therapy: WFL for tasks assessed/performed Cognition: No apparent impairments             OT - Cognition Comments: Pt answered questions appropriately, able to follow multi-step commands, and asked appropriate questions pertaining to situation. Pt able to recall precautions.                 Following commands: Intact       Cueing  General Comments   Cueing Techniques: Verbal cues      Exercises     Shoulder Instructions      Home Living Family/patient expects to be discharged to:: Private residence Living Arrangements: Alone Available Help at Discharge: Family;Available 24 hours/day (Will stay with friend for ~2days when discharged and then she will return to her home with daugher who lives out of town. The following week, her 2nd daughter w) Type of Home: House Home Access: Stairs to enter [employer] of Steps: 2 Entrance Stairs-Rails: None Home Layout: Two level;Able to live on main level with bedroom/bathroom Alternate Level Stairs-Number of Steps: flight Alternate Level Stairs-Rails: Right Bathroom Shower/Tub: Walk-in shower;Tub/shower unit   Bathroom Toilet: Standard     Home Equipment: Shower seat   Additional Comments: Has a recliner that she will sleep in when discharged home. Reports she will work on having grab bars installed in shower.      Prior Functioning/Environment Prior Level of Function : Independent/Modified Independent;Driving  Mobility Comments: completed  functional mobility independently without use of DME ADLs Comments: completed ADLs independently without DME. Completed yard work    OT Problem List: Decreased strength;Decreased activity tolerance;Impaired balance (sitting and/or standing);Decreased safety awareness;Decreased knowledge of use of DME or AE;Decreased knowledge of precautions;Pain   OT Treatment/Interventions: Self-care/ADL training;DME and/or AE instruction;Therapeutic activities;Patient/family education;Balance training;Therapeutic exercise      OT Goals(Current goals can be found in the care plan section)   Acute Rehab OT Goals Patient Stated Goal: to get better OT Goal Formulation: With patient Time For Goal Achievement: 06/05/23 Potential to Achieve Goals: Good ADL Goals Additional ADL Goal #1: Pt will complete all ADLs MOD I   OT Frequency:  Min 1X/week    Co-evaluation              AM-PAC OT "6 Clicks" Daily Activity     Outcome Measure Help from another person eating meals?: None Help from another person taking care of personal grooming?: A Little Help from another person toileting, which includes using toliet, bedpan, or urinal?: A Little Help from another person bathing (including washing, rinsing, drying)?: A Little Help from another person to put on and taking off regular upper body clothing?: A Little Help from another person to put on and taking off regular lower body clothing?: A Little 6 Click Score: 19   End of Session Equipment Utilized During Treatment: Rolling walker (2 wheels);Gait belt Nurse Communication: Mobility status  Activity Tolerance: Patient limited by pain Patient left: in bed;with call bell/phone within reach  OT Visit Diagnosis: Unsteadiness on feet (R26.81);Other abnormalities of gait and mobility (R26.89);Muscle weakness (generalized) (M62.81);Pain Pain - Right/Left: Left Pain - part of body: Leg                Time: 0810 [phone] OT Time Calculation (min): 25  min Charges:  OT General Charges $OT Visit: 1 Visit OT Evaluation $OT Eval Moderate Complexity: 1 [id number], MOTS Lakina Mcintire 06/05/2023, 9:21 AM

## 2023-06-05 NOTE — Evaluation (Signed)
Physical Therapy Evaluation Patient Details Name: Margaret Craig MRN: [id number] DOB: 1945-10-18 Today's Date: 06/05/2023  History of Present Illness  Pt is a 78 y.o. female who presents s/p ALIF L4-5 and PSF L4-5 on 06/04/2023. PMH significant for arthritis, HTN, thyroid disease, fainting spell.   Clinical Impression  Pt admitted with above diagnosis. At the time of PT eval, pt was able to demonstrate transfers and ambulation with gross CGA and RW for support. Pt was educated on precautions, brace application/wearing schedule, appropriate activity progression, and car transfer. Pt currently with functional limitations due to the deficits listed below (see PT Problem List). Pt will benefit from skilled PT to increase their independence and safety with mobility to allow discharge to the venue listed below.          If plan is discharge home, recommend the following: A little help with walking and/or transfers;A little help with bathing/dressing/bathroom;Assistance with cooking/housework;Assist for transportation;Help with stairs or ramp for entrance   Can travel by private vehicle        Equipment Recommendations Rolling walker (2 wheels)  Recommendations for Other Services       Functional Status Assessment Patient has had a recent decline in their functional status and demonstrates the ability to make significant improvements in function in a reasonable and predictable amount of time.     Precautions / Restrictions Precautions Precautions: Back;Fall Precaution Booklet Issued: Yes (comment) Recall of Precautions/Restrictions: Intact Precaution/Restrictions Comments: Reviewed handout and pt was cued for precautions during functional mobility. Required Braces or Orthoses: Spinal Brace Spinal Brace: Thoracolumbosacral orthotic Restrictions Weight Bearing Restrictions Per Provider Order: No      Mobility  Bed Mobility Overal bed mobility: Needs Assistance Bed Mobility:  Rolling, Sidelying to Sit, Sit to Sidelying Rolling: Contact guard assist Sidelying to sit: Contact guard assist     Sit to sidelying: Contact guard assist General bed mobility comments: Multimodal cues for optimal log roll technique.    Transfers Overall transfer level: Needs assistance Equipment used: Rolling walker (2 wheels) Transfers: Sit to/from Stand Sit to Stand: Contact guard assist           General transfer comment: VC's for hand placement on seated surface for safety.    Ambulation/Gait Ambulation/Gait assistance: Contact guard assist Gait Distance (Feet): 300 Feet Assistive device: Rolling walker (2 wheels) Gait Pattern/deviations: Step-through pattern, Decreased stride length, Trunk flexed Gait velocity: Decreased Gait velocity interpretation: <1.8 ft/sec, indicate of risk for recurrent falls   General Gait Details: VC's for sequencing and general safety. No assist required. No knee buckling noted.  Hands on guarding provided throughout for safety as pt reports knee buckling earlier today.  Stairs  2 stairs with CGA and B rails. VC's for sequencing and general safety.           Wheelchair Mobility     Tilt Bed    Modified Rankin (Stroke Patients Only)       Balance Overall balance assessment: Needs assistance Sitting-balance support: No upper extremity supported, Feet supported Sitting balance-Leahy Scale: Fair Sitting balance - Comments: completed lower body dressing sitting EOB using figure-4 method   Standing balance support: No upper extremity supported, During functional activity Standing balance-Leahy Scale: Fair Standing balance comment: completed clothing management while standing unsupported                             Pertinent Vitals/Pain Pain Assessment Faces Pain Scale: Hurts whole lot Pain  Location: LLE Pain Descriptors / Indicators: Aching, Guarding, Grimacing    Home Living Family/patient expects to be  discharged to:: Private residence Living Arrangements: Alone Available Help at Discharge: Family;Available 24 hours/day (Will stay with friend for ~2days when discharged and then she will return to her home with daugher who lives out of town. The following week, her 2nd daughter w) Type of Home: House Home Access: Stairs to enter Entrance Stairs-Rails: None Entrance Stairs-Number of Steps: 2 Alternate Level Stairs-Number of Steps: flight Home Layout: Two level;Able to live on main level with bedroom/bathroom Home Equipment: Shower seat Additional Comments: Has a recliner that she will sleep in when discharged home. Reports she will work on having grab bars installed in shower.    Prior Function Prior Level of Function : Independent/Modified Independent;Driving             Mobility Comments: completed functional mobility independently without use of DME ADLs Comments: completed ADLs independently without DME. Completed yard work     Extremity/Trunk Assessment   Upper Extremity Assessment Upper Extremity Assessment: Defer to OT evaluation    Lower Extremity Assessment Lower Extremity Assessment: Generalized weakness;LLE deficits/detail LLE Deficits / Details: Pt reports increased pain in L hip s/p surgery. No drop foot, knee buckling, or sensation changes noted/reported during session.    Cervical / Trunk Assessment Cervical / Trunk Assessment: Back Surgery  Communication   Communication Communication: No apparent difficulties    Cognition Arousal: Alert Behavior During Therapy: WFL for tasks assessed/performed                             Following commands: Intact       Cueing Cueing Techniques: Verbal cues     General Comments      Exercises     Assessment/Plan    PT Assessment Patient needs continued PT services  PT Problem List Decreased strength;Decreased activity tolerance;Decreased mobility;Decreased balance;Decreased knowledge of use of  DME;Decreased safety awareness;Decreased knowledge of precautions;Pain       PT Treatment Interventions DME instruction;Gait training;Stair training;Functional mobility training;Therapeutic activities;Balance training;Therapeutic exercise;Patient/family education    PT Goals (Current goals can be found in the Care Plan section)  Acute Rehab PT Goals Patient Stated Goal: Home today PT Goal Formulation: With patient Time For Goal Achievement: 06/12/23 Potential to Achieve Goals: Good    Frequency Min 5X/week     Co-evaluation               AM-PAC PT "6 Clicks" Mobility  Outcome Measure Help needed turning from your back to your side while in a flat bed without using bedrails?: A Little Help needed moving from lying on your back to sitting on the side of a flat bed without using bedrails?: A Little Help needed moving to and from a bed to a chair (including a wheelchair)?: A Little Help needed standing up from a chair using your arms (e.g., wheelchair or bedside chair)?: A Little Help needed to walk in hospital room?: A Little Help needed climbing 3-5 steps with a railing? : A Little 6 Click Score: 18    End of Session Equipment Utilized During Treatment: Gait belt;Back brace Activity Tolerance: Patient tolerated treatment well Patient left: in bed;with call bell/phone within reach Nurse Communication: Mobility status PT Visit Diagnosis: Unsteadiness on feet (R26.81);Pain Pain - part of body:  (back)    Time: 4540-9811 PT Time Calculation (min) (ACUTE ONLY): 37 min   Charges:  PT Evaluation $PT Eval Low Complexity: 1 Low PT Treatments $Gait Training: 8-22 mins PT General Charges $$ ACUTE PT VISIT: 1 Visit         Conni Slipper, PT, DPT Acute Rehabilitation Services Secure Chat Preferred Office: [phone]   Margaret Craig 06/05/2023, 12:28 PM

## 2023-06-05 NOTE — Discharge Summary (Signed)
Patient ID: Margaret Craig MRN: [id number] DOB/AGE: 1945-06-23 78 y.o.  Admit date: 06/04/2023 Discharge date: 06/05/2023  Admission Diagnoses:  Principal Problem:   Radiculopathy of lumbar region   Discharge Diagnoses:  Same  Past Medical History:  Diagnosis Date   Allergy    Arthritis    Colon polyps    Fainting spell    Hypertension    PONV (postoperative nausea and vomiting)    Thyroid disease    UTI (urinary tract infection)     Surgeries: Procedure(s): LEFT-SIDED LATERAL INTERBODY FUSION LUMBAR FOUR - LUMBAR FIVE WITH INSTRUMENTATION AND ALLOGRAFT POSTERIOR SPINAL FUSION LUMBAR FOUR - LUMBAR FIVE WITH INSTRUMENTATION AND ALLOGRAFT on 06/04/2023   Consultants: None  Discharged Condition: Improved  Hospital Course: Margaret Craig is an 78 y.o. female who was admitted 06/04/2023 for operative treatment of Radiculopathy of lumbar region. Patient has severe unremitting pain that affects sleep, daily activities, and work/hobbies. After pre-op clearance the patient was taken to the operating room on 06/04/2023 and underwent  Procedure(s): LEFT-SIDED LATERAL INTERBODY FUSION LUMBAR FOUR - LUMBAR FIVE WITH INSTRUMENTATION AND ALLOGRAFT POSTERIOR SPINAL FUSION LUMBAR FOUR - LUMBAR FIVE WITH INSTRUMENTATION AND ALLOGRAFT.    Patient was given perioperative antibiotics:  Anti-infectives (From admission, onward)    Start     Dose/Rate Route Frequency Ordered Stop   06/04/23 1700  ceFAZolin (ANCEF) IVPB 2g/100 mL premix        2 g 200 mL/hr over 30 Minutes Intravenous Every 8 hours 06/04/23 1445 06/05/23 0031   06/04/23 0645  ceFAZolin (ANCEF) IVPB 2g/100 mL premix        2 g 200 mL/hr over 30 Minutes Intravenous On call to O.R. 06/04/23 7829 06/04/23 5621        Patient was given sequential compression devices, early ambulation to prevent DVT.  Patient benefited maximally from hospital stay and there were no complications.    Recent vital signs:  Patient Vitals for the past 24 hrs:  BP Temp Temp src Pulse Resp SpO2  06/05/23 0735 118/85 97.9 F (36.6 C) Oral (!) 57 16 100 %  06/05/23 0450 139/86 97.9 F (36.6 C) Oral (!) 50 18 99 %  06/05/23 0005 127/86 97.6 F (36.4 C) Oral 66 16 100 %  06/04/23 1917 132/79 97.9 F (36.6 C) Oral 76 18 98 %  06/04/23 1547 116/71 (!) 97.5 F (36.4 C) Oral (!) 58 16 100 %  06/04/23 1439 129/75 -- -- (!) 57 18 100 %  06/04/23 1400 118/77 -- -- 68 14 95 %     Discharge Medications:   Allergies as of 06/05/2023       Reactions   Wasp Venom Anaphylaxis   Codeine    Can't think straight, insomnia         Medication List     TAKE these medications    ascorbic acid 500 MG tablet Commonly known as: VITAMIN C Take 500 mg by mouth daily.   CALCIUM + D PO Take 1 tablet by mouth daily.   EPINEPHrine 0.3 mg/0.3 mL Soaj injection Commonly known as: EPI-PEN Inject 0.3 mg into the muscle as needed for anaphylaxis.   lisinopril 5 MG tablet Commonly known as: ZESTRIL TAKE 1/2 TABLET BY MOUTH DAILY   multivitamin with minerals tablet Take 1 tablet by mouth daily.   oxyCODONE-acetaminophen 5-325 MG tablet Commonly known as: PERCOCET/ROXICET Take 1-2 tablets by mouth every 4 (four) hours as needed for severe pain (pain score 7-10).  tiZANidine 2 MG tablet Commonly known as: ZANAFLEX Take 1 tablet (2 mg total) by mouth every 6 (six) hours as needed for muscle spasms.   Zinc 30 MG Caps Take 30 mg by mouth daily.               Durable Medical Equipment  (From admission, onward)           Start     Ordered   06/05/23 1108  For home use only DME Walker rolling  Once       Question Answer Comment  Walker: With 5 Inch Wheels   Patient needs a walker to treat with the following condition Status post spinal surgery      06/05/23 1107            Diagnostic Studies: DG Lumbar Spine 2-3 Views Result Date: 06/04/2023 CLINICAL DATA:  Elective surgery. EXAM: LUMBAR SPINE -  2-3 VIEW COMPARISON:  None Available. FINDINGS: Two fluoroscopic spot views of the lumbar spine submitted from the operating room. Interbody spacer at L4-L5 with left-sided pedicle screw and posterior rod. Fluoroscopy time 4 minutes 43 seconds. Dose 88.93 mGy. IMPRESSION: Procedural fluoroscopy during lumbar surgery. Electronically Signed   By: Narda Rutherford M.D.   On: 06/04/2023 12:40   DG C-Arm 1-60 Min-No Report Result Date: 06/04/2023 Fluoroscopy was utilized by the requesting physician.  No radiographic interpretation.   DG C-Arm 1-60 Min-No Report Result Date: 06/04/2023 Fluoroscopy was utilized by the requesting physician.  No radiographic interpretation.   DG C-Arm 1-60 Min-No Report Result Date: 06/04/2023 Fluoroscopy was utilized by the requesting physician.  No radiographic interpretation.    Disposition: Discharge disposition: 01-Home or Self Care        POD #1 s/p L4/5 lateral/posterior fusion, doing well   - up with PT/OT, encourage ambulation - Percocet for pain, Tizanidine for muscle spasms -Scripts for pain sent to pharmacy electronically  -D/C instructions sheet printed and in chart -D/C today  -F/U in office 2 weeks   Signed: Eilene Ghazi Natalyia Innes 06/05/2023, 1:59 PM

## 2023-06-06 ENCOUNTER — Telehealth: Payer: Self-pay

## 2023-06-06 MED FILL — Thrombin For Soln 20000 Unit: CUTANEOUS | Qty: 1 | Status: AC

## 2023-06-06 NOTE — Transitions of Care (Post Inpatient/ED Visit) (Signed)
   06/06/2023  Name: Margaret Craig MRN: [id number] DOB: [birth year]  Today's TOC FU Call Status: Today's TOC FU Call Status:: Successful TOC FU Call Completed TOC FU Call Complete Date: 06/06/23 Patient's Name and Date of Birth confirmed.  Transition Care Management Follow-up Telephone Call Date of Discharge: 06/05/23 Discharge Facility: [facility]) Type of Discharge: Inpatient Admission Primary Inpatient Discharge Diagnosis:: radiculopathy How have you been since you were released from the hospital?: Same Any questions or concerns?: No  Items Reviewed: Did you receive and understand the discharge instructions provided?: Yes Medications obtained,verified, and reconciled?: Yes (Medications Reviewed) Any new allergies since your discharge?: No Dietary orders reviewed?: Yes Do you have support at home?: Yes People in Home: friend(s)  Medications Reviewed Today: Medications Reviewed Today     Reviewed by Karena Addison, LPN (Licensed Practical Nurse) on 06/06/23 at 1008  Med List Status: <None>   Medication Order Taking? Sig Documenting Provider Last Dose Status Informant  ascorbic acid (VITAMIN C) 500 MG tablet [id number] No Take 500 mg by mouth daily. [provider] 05/29/2023 Active Self  Calcium Citrate-Vitamin D (CALCIUM + D PO) [id number] No Take 1 tablet by mouth daily. [provider] 05/29/2023 Active Self  EPINEPHrine 0.3 mg/0.3 mL IJ SOAJ injection [id number] No Inject 0.3 mg into the muscle as needed for anaphylaxis. Pincus Sanes, MD Not Taking Active Self  lisinopril (ZESTRIL) 5 MG tablet [id number] No TAKE 1/2 TABLET BY MOUTH DAILY Pincus Sanes, MD 06/03/2023 Active Self  Multiple Vitamins-Minerals (MULTIVITAMIN WITH MINERALS) tablet [id number] No Take 1 tablet by mouth daily. [provider] 05/29/2023 Active Self  oxyCODONE-acetaminophen (PERCOCET/ROXICET) 5-325 MG tablet [id number]  Take 1-2 tablets by mouth every 4 (four) hours as  needed for severe pain (pain score 7-10). Estill Bamberg, MD  Active   tiZANidine (ZANAFLEX) 2 MG tablet [id number]  Take 1 tablet (2 mg total) by mouth every 6 (six) hours as needed for muscle spasms. Estill Bamberg, MD  Active   Zinc 30 MG CAPS [id number] No Take 30 mg by mouth daily. [provider] 05/29/2023 Active Self            Home Care and Equipment/Supplies: Were Home Health Services Ordered?: NA Any new equipment or medical supplies ordered?: Yes Name of Medical supply agency?: unknown Were you able to get the equipment/medical supplies?: Yes Do you have any questions related to the use of the equipment/supplies?: No  Functional Questionnaire: Do you need assistance with bathing/showering or dressing?: No Do you need assistance with meal preparation?: No Do you need assistance with eating?: No Do you have difficulty maintaining continence: No Do you need assistance with getting out of bed/getting out of a chair/moving?: No Do you have difficulty managing or taking your medications?: No  Follow up appointments reviewed: PCP Follow-up appointment confirmed?: NA Specialist Hospital Follow-up appointment confirmed?: Yes Date of Specialist follow-up appointment?: 06/20/23 Follow-Up Specialty Provider:: surgeon Do you need transportation to your follow-up appointment?: No Do you understand care options if your condition(s) worsen?: Yes-patient verbalized understanding    SIGNATURE Karena Addison, LPN [facility] Nurse Health Advisor Direct Dial [phone]

## 2023-06-09 ENCOUNTER — Encounter (HOSPITAL_COMMUNITY): Payer: Self-pay | Admitting: Orthopedic Surgery

## 2023-06-20 DIAGNOSIS — M5416 Radiculopathy, lumbar region: Secondary | ICD-10-CM | POA: Diagnosis not present

## 2023-06-30 DIAGNOSIS — M5416 Radiculopathy, lumbar region: Secondary | ICD-10-CM | POA: Diagnosis not present

## 2023-07-14 DIAGNOSIS — M25552 Pain in left hip: Secondary | ICD-10-CM | POA: Diagnosis not present

## 2023-07-28 DIAGNOSIS — M79652 Pain in left thigh: Secondary | ICD-10-CM | POA: Diagnosis not present

## 2023-08-01 DIAGNOSIS — M545 Low back pain, unspecified: Secondary | ICD-10-CM | POA: Diagnosis not present

## 2023-08-01 DIAGNOSIS — M25552 Pain in left hip: Secondary | ICD-10-CM | POA: Diagnosis not present

## 2023-08-14 ENCOUNTER — Other Ambulatory Visit: Payer: Self-pay | Admitting: Internal Medicine

## 2023-08-26 ENCOUNTER — Ambulatory Visit (INDEPENDENT_AMBULATORY_CARE_PROVIDER_SITE_OTHER): Admitting: Internal Medicine

## 2023-08-26 ENCOUNTER — Other Ambulatory Visit: Payer: Self-pay

## 2023-08-26 ENCOUNTER — Encounter: Payer: Self-pay | Admitting: Internal Medicine

## 2023-08-26 ENCOUNTER — Ambulatory Visit: Payer: Self-pay | Admitting: *Deleted

## 2023-08-26 VITALS — BP 122/78 | HR 69 | Temp 98.0°F | Ht 63.0 in | Wt 111.0 lb

## 2023-08-26 DIAGNOSIS — E041 Nontoxic single thyroid nodule: Secondary | ICD-10-CM

## 2023-08-26 DIAGNOSIS — R300 Dysuria: Secondary | ICD-10-CM | POA: Diagnosis not present

## 2023-08-26 DIAGNOSIS — N3000 Acute cystitis without hematuria: Secondary | ICD-10-CM | POA: Insufficient documentation

## 2023-08-26 DIAGNOSIS — I10 Essential (primary) hypertension: Secondary | ICD-10-CM

## 2023-08-26 DIAGNOSIS — N952 Postmenopausal atrophic vaginitis: Secondary | ICD-10-CM | POA: Diagnosis not present

## 2023-08-26 DIAGNOSIS — M85852 Other specified disorders of bone density and structure, left thigh: Secondary | ICD-10-CM

## 2023-08-26 DIAGNOSIS — N3001 Acute cystitis with hematuria: Secondary | ICD-10-CM | POA: Diagnosis not present

## 2023-08-26 LAB — POC URINALSYSI DIPSTICK (AUTOMATED)
Bilirubin, UA: POSITIVE
Glucose, UA: NEGATIVE
Ketones, UA: POSITIVE
Nitrite, UA: POSITIVE
Protein, UA: POSITIVE — AB
Spec Grav, UA: 1.025 (ref 1.010–1.025)
Urobilinogen, UA: 2 U/dL — AB
pH, UA: 6 (ref 5.0–8.0)

## 2023-08-26 MED ORDER — ESTRADIOL 0.1 MG/GM VA CREA: 1.0000 | TOPICAL_CREAM | VAGINAL | 12 refills | Status: DC

## 2023-08-26 MED ORDER — CEPHALEXIN 500 MG PO CAPS
500.0000 mg | ORAL_CAPSULE | Freq: Two times a day (BID) | ORAL | 0 refills | Status: DC
Start: 2023-08-26 — End: 2023-12-04

## 2023-08-26 NOTE — Progress Notes (Signed)
 Subjective:    Patient ID: Margaret Craig, female    DOB: 1945-09-01, 78 y.o.   MRN: [id number]      HPI Margaret Craig is here for  Chief Complaint  Patient presents with   Urinary Tract Infection    Burning and frequency    ? UTI:  Her symptoms started  2 days ago.  She states dysuria, urinary frequency, and urinary urgency.  She denies any hematuria, abdominal pain, nausea, fever.  She is still recovering from her recent back surgery so her back does hurt, but there is nothing unexpected.  Her last UTI was in July of last summer.  She feels like she is very hygienic and not sure why she got this urinary tract infection.   10/2022 urine culture-pansensitive Ecoli   Medications and allergies reviewed with patient and updated if appropriate.  Current Outpatient Medications on File Prior to Visit  Medication Sig Dispense Refill   ascorbic acid (VITAMIN C ) 500 MG tablet Take 500 mg by mouth daily.     Calcium  Citrate-Vitamin D  (CALCIUM  + D PO) Take 1 tablet by mouth daily.     EPINEPHrine  0.3 mg/0.3 mL IJ SOAJ injection INJECT INTO THE MIDDLE OF THE OUTER THIGH AND HOLD FOR 10 SECONDS AS NEEDED FOR SEVERE ALLERGIC REACTION THEN CALL 911 IF USED 2 each 2   lisinopril  (ZESTRIL ) 5 MG tablet TAKE 1/2 TABLET BY MOUTH DAILY 45 tablet 2   meloxicam  (MOBIC ) 15 MG tablet Take 15 mg by mouth daily.     Multiple Vitamins-Minerals (MULTIVITAMIN WITH MINERALS) tablet Take 1 tablet by mouth daily.     oxyCODONE -acetaminophen  (PERCOCET/ROXICET) 5-325 MG tablet Take 1-2 tablets by mouth every 4 (four) hours as needed for severe pain (pain score 7-10). 30 tablet 0   tiZANidine  (ZANAFLEX ) 2 MG tablet Take 1 tablet (2 mg total) by mouth every 6 (six) hours as needed for muscle spasms. 24 tablet 2   Zinc  30 MG CAPS Take 30 mg by mouth daily.     No current facility-administered medications on file prior to visit.    Review of Systems  Constitutional:  Negative for fever.   Gastrointestinal:  Negative for abdominal pain and nausea.  Genitourinary:  Positive for dysuria, frequency and urgency. Negative for hematuria.  Musculoskeletal:  Negative for back pain.       Objective:   Vitals:   08/26/23 1511  BP: 122/78  Pulse: 69  Temp: 98 F (36.7 C)  SpO2: 94%   BP Readings from Last 3 Encounters:  08/26/23 122/78  06/05/23 118/85  05/29/23 123/74   Wt Readings from Last 3 Encounters:  08/26/23 111 lb (50.3 kg)  06/04/23 110 lb (49.9 kg)  05/29/23 111 lb 4.8 oz (50.5 kg)   Body mass index is 19.66 kg/m.    Physical Exam Constitutional:      General: She is not in acute distress.    Appearance: Normal appearance. She is not ill-appearing.  HENT:     Head: Normocephalic.  Eyes:     Conjunctiva/sclera: Conjunctivae normal.  Abdominal:     General: There is no distension.     Palpations: Abdomen is soft.     Tenderness: There is no abdominal tenderness. There is no guarding or rebound.  Skin:    General: Skin is warm and dry.  Neurological:     Mental Status: She is alert.            Assessment & Plan:  See Problem List for Assessment and Plan of chronic medical problems.

## 2023-08-26 NOTE — Telephone Encounter (Signed)
 Copied from CRM [id number]. Topic: Clinical - Red Word Triage >> Aug 26, 2023  8:04 AM Antwanette L wrote: Red Word that prompted transfer to Nurse Triage: Patient is having severe pain. Patient is experiencing a pain/ burning sensation when peeing Reason for Disposition  Age > 50 years  Answer Assessment - Initial Assessment Questions 1. SEVERITY: "How bad is the pain?"  (e.g., Scale 1-10; mild, moderate, or severe)   - MILD (1-3): complains slightly about urination hurting   - MODERATE (4-7): interferes with normal activities     - SEVERE (8-10): excruciating, unwilling or unable to urinate because of the pain      I'm having burning with urination.    2. FREQUENCY: "How many times have you had painful urination today?"      Yes every time 3. PATTERN: "Is pain present every time you urinate or just sometimes?"      Yes 4. ONSET: "When did the painful urination start?"      Started yesterday.   I'm peeing every hour. 5. FEVER: "Do you have a fever?" If Yes, ask: "What is your temperature, how was it measured, and when did it start?"     No 6. PAST UTI: "Have you had a urine infection before?" If Yes, ask: "When was the last time?" and "What happened that time?"      Yes   A year ago 7. CAUSE: "What do you think is causing the painful urination?"  (e.g., UTI, scratch, Herpes sore)     UTI 8. OTHER SYMPTOMS: "Do you have any other symptoms?" (e.g., blood in urine, flank pain, genital sores, urgency, vaginal discharge)     No blood in urine or odor 9. PREGNANCY: "Is there any chance you are pregnant?" "When was your last menstrual period?"     N/A due to age  Protocols used: Urination Pain - Female-A-AH  Chief Complaint: Burning with urination Symptoms: frequency, up every hour last night with painful urination Frequency: Since Monday afternoon Pertinent Negatives: Patient denies blood in urine or flank pain, no fever Disposition: [] ED /[] Urgent Care (no appt availability in office) /  [x] Appointment(In office/virtual)/ []  [facility]/ [] Home Care/ [] Refused Recommended Disposition /[] [facility]/ []  Follow-up with PCP Additional Notes: Appt made for today with Dr Donnette Gal.

## 2023-08-26 NOTE — Assessment & Plan Note (Signed)
Acute Urine dip consistent with UTI Will send urine for culture Take the antibiotic as prescribed.  Keflex 500 mg twice daily x 7 days Take tylenol if needed.   Increase your water intake.  Call if no improvement

## 2023-08-26 NOTE — Addendum Note (Signed)
 Addended by: Katherene Pals on: 08/26/2023 04:56 PM   Modules accepted: Orders

## 2023-08-26 NOTE — Assessment & Plan Note (Signed)
 Has had 2 urinary tract infections in the last year for no apparent reason Discussed this is likely related to vaginal atrophy Will start Estrace vaginal cream daily for 1 week and then twice a week for vaginal atrophy and prevention of UTIs

## 2023-08-26 NOTE — Patient Instructions (Addendum)
     Medications changes include :   keflex  twice daily x 7 week, estrogen       Return if symptoms worsen or fail to improve.

## 2023-08-27 DIAGNOSIS — R300 Dysuria: Secondary | ICD-10-CM | POA: Diagnosis not present

## 2023-08-28 ENCOUNTER — Encounter: Payer: Self-pay | Admitting: Internal Medicine

## 2023-08-28 LAB — URINE CULTURE

## 2023-08-29 ENCOUNTER — Other Ambulatory Visit: Payer: Self-pay

## 2023-08-29 MED ORDER — ESTRADIOL 0.1 MG/GM VA CREA: 1.0000 | TOPICAL_CREAM | VAGINAL | 12 refills | Status: DC

## 2023-08-29 NOTE — Telephone Encounter (Signed)
 Copied from CRM [id number]. Topic: Clinical - Lab/Test Results >> Aug 29, 2023  1:07 PM Marlan Silva wrote: Reason for CRM: Patient called in wanting to know why her urine test results resulted as "un definitive". Patient states she was prescribe medication, and it is working but she wants to know what her test results mean and why was she prescribed medication. Patient is also requesting that her estrogen cream to be mailed due to the pricing being very high at the pharmacy. She said she did not pick up the medication because Dr. Donnette Gal CMA told her, if the medication was too expensive she could mail it.Patient can be reached at the call back number [phone].

## 2023-09-08 DIAGNOSIS — M545 Low back pain, unspecified: Secondary | ICD-10-CM | POA: Diagnosis not present

## 2023-09-17 ENCOUNTER — Ambulatory Visit

## 2023-09-17 VITALS — Ht 63.0 in | Wt 111.0 lb

## 2023-09-17 DIAGNOSIS — Z0000 Encounter for general adult medical examination without abnormal findings: Secondary | ICD-10-CM

## 2023-09-17 NOTE — Progress Notes (Signed)
 Subjective:   Margaret Craig is a 78 y.o. who presents for a Medicare Wellness preventive visit.  As a reminder, Annual Wellness Visits don't include a physical exam, and some assessments may be limited, especially if this visit is performed virtually. We may recommend an in-person follow-up visit with your provider if needed.  Visit Complete: Virtual I connected with  Adelfa Lozito Ellenbecker on 09/17/23 by a audio enabled telemedicine application and verified that I am speaking with the correct person using two identifiers.  Patient Location: Home  Provider Location: Home Office  I discussed the limitations of evaluation and management by telemedicine. The patient expressed understanding and agreed to proceed.  Vital Signs: Because this visit was a virtual/telehealth visit, some criteria may be missing or patient reported. Any vitals not documented were not able to be obtained and vitals that have been documented are patient reported.  VideoDeclined- This patient declined Librarian, academic. Therefore the visit was completed with audio only.  Persons Participating in Visit: Patient.  AWV Questionnaire: No: Patient Medicare AWV questionnaire was not completed prior to this visit.        Objective:     Today's Vitals   09/17/23 0813  Weight: 111 lb (50.3 kg)  Height: 5\' 3"  (1.6 m)   Body mass index is 19.66 kg/m.     09/17/2023    8:16 AM 05/29/2023    1:08 PM 09/17/2022    8:50 AM 11/13/2020   12:20 PM  Advanced Directives  Does Patient Have a Medical Advance Directive? Yes Yes Yes Yes  Type of Estate agent of [place];Living will Healthcare Power of [employer] of [place];Living will Living will;Healthcare Power of Attorney  Does patient want to make changes to medical advance directive?  No - Patient declined  No - Patient declined  Copy of Healthcare Power of Attorney in Chart? No - copy  requested Yes - validated most recent copy scanned in chart (See row information) No - copy requested No - copy requested    Current Medications (verified) Outpatient Encounter Medications as of 09/17/2023  Medication Sig   ascorbic acid (VITAMIN C ) 500 MG tablet Take 500 mg by mouth daily.   Calcium  Citrate-Vitamin D  (CALCIUM  + D PO) Take 1 tablet by mouth daily.   EPINEPHrine  0.3 mg/0.3 mL IJ SOAJ injection INJECT INTO THE MIDDLE OF THE OUTER THIGH AND HOLD FOR 10 SECONDS AS NEEDED FOR SEVERE ALLERGIC REACTION THEN CALL 911 IF USED   estradiol  (ESTRACE  VAGINAL) 0.1 MG/GM vaginal cream Place 1 Applicatorful vaginally 2 (two) times a week.   lisinopril  (ZESTRIL ) 5 MG tablet TAKE 1/2 TABLET BY MOUTH DAILY   meloxicam  (MOBIC ) 15 MG tablet Take 15 mg by mouth daily.   Multiple Vitamins-Minerals (MULTIVITAMIN WITH MINERALS) tablet Take 1 tablet by mouth daily.   oxyCODONE -acetaminophen  (PERCOCET/ROXICET) 5-325 MG tablet Take 1-2 tablets by mouth every 4 (four) hours as needed for severe pain (pain score 7-10).   tiZANidine  (ZANAFLEX ) 2 MG tablet Take 1 tablet (2 mg total) by mouth every 6 (six) hours as needed for muscle spasms.   Zinc  30 MG CAPS Take 30 mg by mouth daily.   cephALEXin  (KEFLEX ) 500 MG capsule Take 1 capsule (500 mg total) by mouth 2 (two) times daily. (Patient not taking: Reported on 09/17/2023)   No facility-administered encounter medications on file as of 09/17/2023.    Allergies (verified) Wasp venom and Codeine   History: Past Medical History:  Diagnosis  Date   Allergy    Arthritis    Colon polyps    Fainting spell    Hypertension    PONV (postoperative nausea and vomiting)    Thyroid  disease    UTI (urinary tract infection)    Past Surgical History:  Procedure Laterality Date   ANTERIOR LAT LUMBAR FUSION Left 06/04/2023   Procedure: LEFT-SIDED LATERAL INTERBODY FUSION LUMBAR FOUR - LUMBAR FIVE WITH INSTRUMENTATION AND ALLOGRAFT;  Surgeon: Virl Grimes, MD;   Location: [facility] OR;  Service: Orthopedics;  Laterality: Left;   COLONOSCOPY     POSTERIOR FUSION PEDICLE SCREW PLACEMENT N/A 06/04/2023   Procedure: POSTERIOR SPINAL FUSION LUMBAR FOUR - LUMBAR FIVE WITH INSTRUMENTATION AND ALLOGRAFT;  Surgeon: Virl Grimes, MD;  Location: [facility] OR;  Service: Orthopedics;  Laterality: N/A;   SINUS IRRIGATION     TONSILLECTOMY     Family History  Problem Relation Age of Onset   Mental illness Mother    Colon cancer Mother 66   Cancer Maternal Grandmother        breast   Heart disease Maternal Grandfather    Alcohol abuse Son    Mental illness Son    Heart disease Maternal Aunt    Colon cancer Maternal Aunt        diagnosed in her 19s   Breast cancer Maternal Aunt    Heart disease Paternal Aunt    Alcohol abuse Paternal Uncle    Esophageal cancer Neg Hx    Stomach cancer Neg Hx    Rectal cancer Neg Hx    Social History   Socioeconomic History   Marital status: Widowed    Spouse name: Not on file   Number of children: 2   Years of education: Not on file   Highest education level: Not on file  Occupational History   Occupation: RETIRED  Tobacco Use   Smoking status: Never   Smokeless tobacco: Never  Vaping Use   Vaping status: Never Used  Substance and Sexual Activity   Alcohol use: Yes    Alcohol/week: 2.0 standard drinks of alcohol    Types: 2 Glasses of wine per week    Comment: 2-3 glasses per week   Drug use: Never   Sexual activity: Not on file  Other Topics Concern   Not on file  Social History Narrative   1 child deceased. 2 living   Lives alone/2025   Social Drivers of Health   Financial Resource Strain: Low Risk  (09/17/2023)   Overall Financial Resource Strain (CARDIA)    Difficulty of Paying Living Expenses: Not hard at all  Food Insecurity: No Food Insecurity (09/17/2023)   Hunger Vital Sign    Worried About Running Out of Food in the Last Year: Never true    Ran Out of Food in the Last Year: Never true  Transportation  Needs: No Transportation Needs (09/17/2023)   PRAPARE - Administrator, Civil Service (Medical): No    Lack of Transportation (Non-Medical): No  Physical Activity: Sufficiently Active (09/17/2023)   Exercise Vital Sign    Days of Exercise per Week: 6 days    Minutes of Exercise per Session: 60 min  Stress: No Stress Concern Present (09/17/2023)   [employer] of Occupational Health - Occupational Stress Questionnaire    Feeling of Stress : Not at all  Social Connections: Moderately Integrated (09/17/2023)   Social Connection and Isolation Panel [NHANES]    Frequency of Communication with Friends and Family: More than  three times a week    Frequency of Social Gatherings with Friends and Family: Three times a week    Attends Religious Services: More than 4 times per year    Active Member of Clubs or Organizations: Yes    Attends Banker Meetings: Never    Marital Status: Widowed    Tobacco Counseling Counseling given: Not Answered    Clinical Intake:  Pre-visit preparation completed: Yes  Pain : No/denies pain     BMI - recorded: 19.66 Nutritional Status: BMI of 19-24  Normal Nutritional Risks: None Diabetes: No  No results found for: "HGBA1C"   How often do you need to have someone help you when you read instructions, pamphlets, or other written materials from your doctor or pharmacy?: 1 - Never  Interpreter Needed?: No  Information entered by :: Chaunice Obie, RMA   Activities of Daily Living     05/29/2023    1:12 PM 05/29/2023    1:08 PM  In your present state of health, do you have any difficulty performing the following activities:  Hearing?  0  Vision?  0  Difficulty concentrating or making decisions?  0  Doing errands, shopping? 0     Patient Care Team: Colene Dauphin, MD as PCP - General (Internal Medicine)  Indicate any recent Medical Services you may have received from other than [facility] providers in the past year (date may be  approximate).     Assessment:    This is a routine wellness examination for Amantha.  Hearing/Vision screen Hearing Screening - Comments:: Denies hearing difficulties   Vision Screening - Comments:: Denies vision issues./ Dr. Bernetta Brilliant    Goals Addressed               This Visit's Progress     Patient Stated (pt-stated)   On track     Client understands the importance of follow-up with providers by attending scheduled visits and discussed goals to eat healthier, increase physical activity, exercise the brain, socialize more,  get enough rest and make time for laughter.       Depression Screen     09/17/2023    8:19 AM 08/26/2023    3:16 PM 12/03/2022    1:48 PM 11/11/2022    9:10 AM 09/17/2022    8:48 AM 10/17/2021   10:06 AM 11/13/2020   12:22 PM  PHQ 2/9 Scores  PHQ - 2 Score 0 0 0 0 0 0 0  PHQ- 9 Score 0  1   1     Fall Risk     09/17/2023    8:17 AM 08/26/2023    3:15 PM 12/03/2022    1:48 PM 11/11/2022    9:10 AM 09/17/2022    8:51 AM  Fall Risk   Falls in the past year? 0 0 0 0 0  Number falls in past yr: 0 0 0 0 0  Injury with Fall? 0 0 0 0 0  Risk for fall due to :  No Fall Risks No Fall Risks No Fall Risks No Fall Risks  Follow up Falls evaluation completed;Falls prevention discussed Falls evaluation completed  Falls evaluation completed Falls prevention discussed;Falls evaluation completed    MEDICARE RISK AT HOME:  Medicare Risk at Home Any stairs in or around the home?: Yes (2 story home) If so, are there any without handrails?: Yes Home free of loose throw rugs in walkways, pet beds, electrical cords, etc?: Yes Adequate lighting in your home to  reduce risk of falls?: Yes Life alert?: No Use of a cane, walker or w/c?: No Grab bars in the bathroom?: Yes Shower chair or bench in shower?: Yes Elevated toilet seat or a handicapped toilet?: Yes  TIMED UP AND GO:  Was the test performed?  No  Cognitive Function: Declined/Normal: No cognitive concerns noted  by patient or family. Patient alert, oriented, able to answer questions appropriately and recall recent events. No signs of memory loss or confusion.        09/17/2022    8:48 AM  6CIT Screen  What Year? 0 points  What month? 0 points  What time? 0 points  Count back from 20 0 points  Months in reverse 0 points  Repeat phrase 0 points  Total Score 0 points    Immunizations Immunization History  Administered Date(s) Administered   Fluad Quad(high Dose 65+) 12/31/2018, 01/07/2022   Influenza, High Dose Seasonal PF 02/21/2016, 01/13/2018   Influenza,inj,Quad PF,6+ Mos 02/03/2014   Influenza-Unspecified 02/04/2015, 01/20/2017   PFIZER(Purple Top)SARS-COV-2 Vaccination 05/16/2019, 06/07/2019, 09/03/2020   Pfizer Covid-19 Vaccine Bivalent Booster 68yrs & up 01/30/2022   Pneumococcal Conjugate-13 06/06/2014   Pneumococcal Polysaccharide-23 04/22/2013   Zoster Recombinant(Shingrix) 09/19/2016    Screening Tests Health Maintenance  Topic Date Due   DTaP/Tdap/Td (1 - Tdap) Never done   COVID-19 Vaccine (5 - 2024-25 season) 12/22/2022   DEXA SCAN  03/31/2023   INFLUENZA VACCINE  11/21/2023   Colonoscopy  02/02/2024   Medicare Annual Wellness (AWV)  09/16/2024   Pneumonia Vaccine 86+ Years old  Completed   Hepatitis C Screening  Completed   HPV VACCINES  Aged Out   Meningococcal B Vaccine  Aged Out   Zoster Vaccines- Shingrix  Discontinued    Health Maintenance  Health Maintenance Due  Topic Date Due   DTaP/Tdap/Td (1 - Tdap) Never done   COVID-19 Vaccine (5 - 2024-25 season) 12/22/2022   DEXA SCAN  03/31/2023   Health Maintenance Items Addressed: See Nurse Notes  Additional Screening:  Vision Screening: Recommended annual ophthalmology exams for early detection of glaucoma and other disorders of the eye.  Dental Screening: Recommended annual dental exams for proper oral hygiene  Community Resource Referral / Chronic Care Management: CRR required this visit?  No    CCM required this visit?  No   Plan:    I have personally reviewed and noted the following in the patient's chart:   Medical and social history Use of alcohol, tobacco or illicit drugs  Current medications and supplements including opioid prescriptions. Patient is not currently taking opioid prescriptions. Functional ability and status Nutritional status Physical activity Advanced directives List of other physicians Hospitalizations, surgeries, and ER visits in previous 12 months Vitals Screenings to include cognitive, depression, and falls Referrals and appointments  In addition, I have reviewed and discussed with patient certain preventive protocols, quality metrics, and best practice recommendations. A written personalized care plan for preventive services as well as general preventive health recommendations were provided to patient.   Dicy Smigel L Jmya Uliano, CMA   09/17/2023   After Visit Summary: (MyChart) Due to this being a telephonic visit, the after visit summary with patients personalized plan was offered to patient via MyChart   Notes: Please refer to Routing Comments.

## 2023-09-17 NOTE — Patient Instructions (Signed)
 Margaret Craig , Thank you for taking time out of your busy schedule to complete your Annual Wellness Visit with me. I enjoyed our conversation and look forward to speaking with you again next year. I, as well as your care team,  appreciate your ongoing commitment to your health goals. Please review the following plan we discussed and let me know if I can assist you in the future. Your Game plan/ To Do List   Follow up Visits: Next Medicare AWV with our clinical staff: 09/17/2024.   Have you seen your provider in the last 6 months (3 months if uncontrolled diabetes)? Yes Next Office Visit with your provider: 12/05/2023.  Clinician Recommendations:  Aim for 30 minutes of exercise or brisk walking, 6-8 glasses of water, and 5 servings of fruits and vegetables each day. You are due for a tetanus vaccine and can get that done at your local pharmacy.  Please remember to discuss a bone density screening as it is time for you to get that done this year.        This is a list of the screening recommended for you and due dates:  Health Maintenance  Topic Date Due   DTaP/Tdap/Td vaccine (1 - Tdap) Never done   COVID-19 Vaccine (5 - 2024-25 season) 12/22/2022   DEXA scan (bone density measurement)  03/31/2023   Flu Shot  11/21/2023   Colon Cancer Screening  02/02/2024   Medicare Annual Wellness Visit  09/16/2024   Pneumonia Vaccine  Completed   Hepatitis C Screening  Completed   HPV Vaccine  Aged Out   Meningitis B Vaccine  Aged Out   Zoster (Shingles) Vaccine  Discontinued    Advanced directives: (Copy Requested) Please bring a copy of your health care power of attorney and living will to the office to be added to your chart at your convenience. You can mail to [facility] [address] [place], [place] [address] or email to [email or web address] Advance Care Planning is important because it:  [x]  Makes sure you receive the medical care that is consistent with your values, goals,  and preferences  [x]  It provides guidance to your family and loved ones and reduces their decisional burden about whether or not they are making the right decisions based on your wishes.  Follow the link provided in your after visit summary or read over the paperwork we have mailed to you to help you started getting your Advance Directives in place. If you need assistance in completing these, please reach out to us  so that we can help you!  See attachments for Preventive Care and Fall Prevention Tips.

## 2023-09-24 DIAGNOSIS — M545 Low back pain, unspecified: Secondary | ICD-10-CM | POA: Diagnosis not present

## 2023-09-24 DIAGNOSIS — R531 Weakness: Secondary | ICD-10-CM | POA: Diagnosis not present

## 2023-10-08 DIAGNOSIS — L738 Other specified follicular disorders: Secondary | ICD-10-CM | POA: Diagnosis not present

## 2023-10-08 DIAGNOSIS — L821 Other seborrheic keratosis: Secondary | ICD-10-CM | POA: Diagnosis not present

## 2023-10-08 DIAGNOSIS — D2261 Melanocytic nevi of right upper limb, including shoulder: Secondary | ICD-10-CM | POA: Diagnosis not present

## 2023-10-08 DIAGNOSIS — L570 Actinic keratosis: Secondary | ICD-10-CM | POA: Diagnosis not present

## 2023-10-08 DIAGNOSIS — R531 Weakness: Secondary | ICD-10-CM | POA: Diagnosis not present

## 2023-10-08 DIAGNOSIS — M545 Low back pain, unspecified: Secondary | ICD-10-CM | POA: Diagnosis not present

## 2023-10-08 DIAGNOSIS — D225 Melanocytic nevi of trunk: Secondary | ICD-10-CM | POA: Diagnosis not present

## 2023-10-08 DIAGNOSIS — L720 Epidermal cyst: Secondary | ICD-10-CM | POA: Diagnosis not present

## 2023-10-08 DIAGNOSIS — D2262 Melanocytic nevi of left upper limb, including shoulder: Secondary | ICD-10-CM | POA: Diagnosis not present

## 2023-10-08 DIAGNOSIS — D485 Neoplasm of uncertain behavior of skin: Secondary | ICD-10-CM | POA: Diagnosis not present

## 2023-10-08 DIAGNOSIS — L814 Other melanin hyperpigmentation: Secondary | ICD-10-CM | POA: Diagnosis not present

## 2023-10-08 DIAGNOSIS — D1801 Hemangioma of skin and subcutaneous tissue: Secondary | ICD-10-CM | POA: Diagnosis not present

## 2023-10-08 DIAGNOSIS — Z85828 Personal history of other malignant neoplasm of skin: Secondary | ICD-10-CM | POA: Diagnosis not present

## 2023-10-08 DIAGNOSIS — D2271 Melanocytic nevi of right lower limb, including hip: Secondary | ICD-10-CM | POA: Diagnosis not present

## 2023-10-10 DIAGNOSIS — M545 Low back pain, unspecified: Secondary | ICD-10-CM | POA: Diagnosis not present

## 2023-10-10 DIAGNOSIS — R531 Weakness: Secondary | ICD-10-CM | POA: Diagnosis not present

## 2023-10-13 DIAGNOSIS — M545 Low back pain, unspecified: Secondary | ICD-10-CM | POA: Diagnosis not present

## 2023-10-13 DIAGNOSIS — R531 Weakness: Secondary | ICD-10-CM | POA: Diagnosis not present

## 2023-10-15 DIAGNOSIS — R531 Weakness: Secondary | ICD-10-CM | POA: Diagnosis not present

## 2023-10-15 DIAGNOSIS — M545 Low back pain, unspecified: Secondary | ICD-10-CM | POA: Diagnosis not present

## 2023-10-23 DIAGNOSIS — M545 Low back pain, unspecified: Secondary | ICD-10-CM | POA: Diagnosis not present

## 2023-11-18 ENCOUNTER — Other Ambulatory Visit: Payer: Self-pay | Admitting: Internal Medicine

## 2023-11-18 DIAGNOSIS — Z1231 Encounter for screening mammogram for malignant neoplasm of breast: Secondary | ICD-10-CM

## 2023-12-02 ENCOUNTER — Other Ambulatory Visit (INDEPENDENT_AMBULATORY_CARE_PROVIDER_SITE_OTHER)

## 2023-12-02 DIAGNOSIS — I10 Essential (primary) hypertension: Secondary | ICD-10-CM

## 2023-12-02 DIAGNOSIS — E041 Nontoxic single thyroid nodule: Secondary | ICD-10-CM

## 2023-12-02 DIAGNOSIS — M85852 Other specified disorders of bone density and structure, left thigh: Secondary | ICD-10-CM | POA: Diagnosis not present

## 2023-12-02 LAB — LIPID PANEL
Cholesterol: 225 mg/dL — ABNORMAL HIGH (ref 0–200)
HDL: 88.4 mg/dL (ref >39.00)
LDL Cholesterol: 119 mg/dL — ABNORMAL HIGH (ref 0–99)
NonHDL: 136.14
Total CHOL/HDL Ratio: 3
Triglycerides: 86 mg/dL (ref 0.0–149.0)
VLDL: 17.2 mg/dL (ref 0.0–40.0)

## 2023-12-02 LAB — COMPREHENSIVE METABOLIC PANEL WITH GFR
ALT: 16 U/L (ref 0–35)
AST: 18 U/L (ref 0–37)
Albumin: 4.3 g/dL (ref 3.5–5.2)
Alkaline Phosphatase: 51 U/L (ref 39–117)
BUN: 17 mg/dL (ref 6–23)
CO2: 28 meq/L (ref 19–32)
Calcium: 9.2 mg/dL (ref 8.4–10.5)
Chloride: 101 meq/L (ref 96–112)
Creatinine, Ser: 0.74 mg/dL (ref 0.40–1.20)
GFR: 77.23 mL/min (ref >60.00)
Glucose, Bld: 90 mg/dL (ref 70–99)
Potassium: 4.5 meq/L (ref 3.5–5.1)
Sodium: 138 meq/L (ref 135–145)
Total Bilirubin: 0.4 mg/dL (ref 0.2–1.2)
Total Protein: 7 g/dL (ref 6.0–8.3)

## 2023-12-02 LAB — CBC WITH DIFFERENTIAL/PLATELET
Basophils Absolute: 0 K/uL (ref 0.0–0.1)
Basophils Relative: 0.9 % (ref 0.0–3.0)
Eosinophils Absolute: 0.1 K/uL (ref 0.0–0.7)
Eosinophils Relative: 2 % (ref 0.0–5.0)
HCT: 40.4 % (ref 36.0–46.0)
Hemoglobin: 13.6 g/dL (ref 12.0–15.0)
Lymphocytes Relative: 28.7 % (ref 12.0–46.0)
Lymphs Abs: 1.3 K/uL (ref 0.7–4.0)
MCHC: 33.7 g/dL (ref 30.0–36.0)
MCV: 97.4 fl (ref 78.0–100.0)
Monocytes Absolute: 0.3 K/uL (ref 0.1–1.0)
Monocytes Relative: 7.3 % (ref 3.0–12.0)
Neutro Abs: 2.9 K/uL (ref 1.4–7.7)
Neutrophils Relative %: 61.1 % (ref 43.0–77.0)
Platelets: 257 K/uL (ref 150.0–400.0)
RBC: 4.14 Mil/uL (ref 3.87–5.11)
RDW: 12.6 % (ref 11.5–15.5)
WBC: 4.7 K/uL (ref 4.0–10.5)

## 2023-12-02 LAB — VITAMIN D 25 HYDROXY (VIT D DEFICIENCY, FRACTURES): VITD: 44.51 ng/mL (ref 30.00–100.00)

## 2023-12-02 LAB — TSH: TSH: 0.17 u[IU]/mL — ABNORMAL LOW (ref 0.35–5.50)

## 2023-12-04 ENCOUNTER — Encounter: Payer: Self-pay | Admitting: Internal Medicine

## 2023-12-04 NOTE — Patient Instructions (Addendum)
 Medications changes include :   None    A referral was ordered Endocrine and someone will call you to schedule an appointment.     Return in about 1 year (around 12/04/2024) for Physical Exam, Schedule DEXA-[place].    Health Maintenance, Female Adopting a healthy lifestyle and getting preventive care are important in promoting health and wellness. Ask your health care provider about: The right schedule for you to have regular tests and exams. Things you can do on your own to prevent diseases and keep yourself healthy. What should I know about diet, weight, and exercise? Eat a healthy diet  Eat a diet that includes plenty of vegetables, fruits, low-fat dairy products, and lean protein. Do not eat a lot of foods that are high in solid fats, added sugars, or sodium. Maintain a healthy weight Body mass index (BMI) is used to identify weight problems. It estimates body fat based on height and weight. Your health care provider can help determine your BMI and help you achieve or maintain a healthy weight. Get regular exercise Get regular exercise. This is one of the most important things you can do for your health. Most adults should: Exercise for at least 150 minutes each week. The exercise should increase your heart rate and make you sweat (moderate-intensity exercise). Do strengthening exercises at least twice a week. This is in addition to the moderate-intensity exercise. Spend less time sitting. Even light physical activity can be beneficial. Watch cholesterol and blood lipids Have your blood tested for lipids and cholesterol at 79 years of age, then have this test every 5 years. Have your cholesterol levels checked more often if: Your lipid or cholesterol levels are high. You are older than 79 years of age. You are at high risk for heart disease. What should I know about cancer screening? Depending on your health history and family history, you may need to have cancer screening  at various ages. This may include screening for: Breast cancer. Cervical cancer. Colorectal cancer. Skin cancer. Lung cancer. What should I know about heart disease, diabetes, and high blood pressure? Blood pressure and heart disease High blood pressure causes heart disease and increases the risk of stroke. This is more likely to develop in people who have high blood pressure readings or are overweight. Have your blood pressure checked: Every 3-5 years if you are 17-44 years of age. Every year if you are 33 years old or older. Diabetes Have regular diabetes screenings. This checks your fasting blood sugar level. Have the screening done: Once every three years after age 77 if you are at a normal weight and have a low risk for diabetes. More often and at a younger age if you are overweight or have a high risk for diabetes. What should I know about preventing infection? Hepatitis B If you have a higher risk for hepatitis B, you should be screened for this virus. Talk with your health care provider to find out if you are at risk for hepatitis B infection. Hepatitis C Testing is recommended for: Everyone born from [birth year] through 1966. Anyone with known risk factors for hepatitis C. Sexually transmitted infections (STIs) Get screened for STIs, including gonorrhea and chlamydia, if: You are sexually active and are younger than 79 years of age. You are older than 79 years of age and your health care provider tells you that you are at risk for this type of infection. Your sexual activity has changed since you were last screened, and  you are at increased risk for chlamydia or gonorrhea. Ask your health care provider if you are at risk. Ask your health care provider about whether you are at high risk for HIV. Your health care provider may recommend a prescription medicine to help prevent HIV infection. If you choose to take medicine to prevent HIV, you should first get tested for HIV. You should then  be tested every 3 months for as long as you are taking the medicine. Pregnancy If you are about to stop having your period (premenopausal) and you may become pregnant, seek counseling before you get pregnant. Take 400 to 800 micrograms (mcg) of folic acid every day if you become pregnant. Ask for birth control (contraception) if you want to prevent pregnancy. Osteoporosis and menopause Osteoporosis is a disease in which the bones lose minerals and strength with aging. This can result in bone fractures. If you are 55 years old or older, or if you are at risk for osteoporosis and fractures, ask your health care provider if you should: Be screened for bone loss. Take a calcium  or vitamin D  supplement to lower your risk of fractures. Be given hormone replacement therapy (HRT) to treat symptoms of menopause. Follow these instructions at home: Alcohol use Do not drink alcohol if: Your health care provider tells you not to drink. You are pregnant, may be pregnant, or are planning to become pregnant. If you drink alcohol: Limit how much you have to: 0-1 drink a day. Know how much alcohol is in your drink. In the [place] one drink equals one 12 oz bottle of beer (355 mL), one 5 oz glass of wine (148 mL), or one 1 oz glass of hard liquor (44 mL). Lifestyle Do not use any products that contain nicotine or tobacco. These products include cigarettes, chewing tobacco, and vaping devices, such as e-cigarettes. If you need help quitting, ask your health care provider. Do not use street drugs. Do not share needles. Ask your health care provider for help if you need support or information about quitting drugs. General instructions Schedule regular health, dental, and eye exams. Stay current with your vaccines. Tell your health care provider if: You often feel depressed. You have ever been abused or do not feel safe at home. Summary Adopting a healthy lifestyle and getting preventive care are important in  promoting health and wellness. Follow your health care provider's instructions about healthy diet, exercising, and getting tested or screened for diseases. Follow your health care provider's instructions on monitoring your cholesterol and blood pressure. This information is not intended to replace advice given to you by your health care provider. Make sure you discuss any questions you have with your health care provider. Document Revised: 08/28/2020 Document Reviewed: 08/28/2020 Elsevier Patient Education  2024 [employer].

## 2023-12-04 NOTE — Progress Notes (Signed)
 Subjective:    Patient ID: Margaret Craig, female    DOB: 05/10/1945, 78 y.o.   MRN: [id number]      HPI Margaret Craig is here for a Physical exam and her chronic medical problems.   Had blood work done.  No concerns   Medications and allergies reviewed with patient and updated if appropriate.  Current Outpatient Medications on File Prior to Visit  Medication Sig Dispense Refill   ascorbic acid (VITAMIN C ) 500 MG tablet Take 500 mg by mouth daily.     Calcium  Citrate-Vitamin D  (CALCIUM  + D PO) Take 1 tablet by mouth daily.     EPINEPHrine  0.3 mg/0.3 mL IJ SOAJ injection INJECT INTO THE MIDDLE OF THE OUTER THIGH AND HOLD FOR 10 SECONDS AS NEEDED FOR SEVERE ALLERGIC REACTION THEN CALL 911 IF USED 2 each 2   lisinopril  (ZESTRIL ) 5 MG tablet TAKE 1/2 TABLET BY MOUTH DAILY 45 tablet 2   meloxicam  (MOBIC ) 15 MG tablet Take 15 mg by mouth daily.     Multiple Vitamins-Minerals (MULTIVITAMIN WITH MINERALS) tablet Take 1 tablet by mouth daily.     oxyCODONE -acetaminophen  (PERCOCET/ROXICET) 5-325 MG tablet Take 1-2 tablets by mouth every 4 (four) hours as needed for severe pain (pain score 7-10). 30 tablet 0   tiZANidine  (ZANAFLEX ) 2 MG tablet Take 1 tablet (2 mg total) by mouth every 6 (six) hours as needed for muscle spasms. 24 tablet 2   Zinc  30 MG CAPS Take 30 mg by mouth daily.     No current facility-administered medications on file prior to visit.    Review of Systems  Constitutional:  Negative for fever.  Eyes:  Negative for visual disturbance.  Respiratory:  Negative for cough, shortness of breath and wheezing.   Cardiovascular:  Negative for chest pain, palpitations and leg swelling.  Gastrointestinal:  Negative for abdominal pain, blood in stool, constipation and diarrhea.       No gerd  Genitourinary:  Negative for dysuria.  Musculoskeletal:  Positive for back pain. Negative for arthralgias.  Skin:  Negative for rash.  Neurological:  Negative for dizziness,  light-headedness, numbness and headaches.  Psychiatric/Behavioral:  Negative for dysphoric mood. The patient is not nervous/anxious.        Objective:   Vitals:   12/05/23 1302  BP: 112/70  Pulse: 60  Temp: 98 F (36.7 C)  SpO2: 96%   Filed Weights   12/05/23 1302  Weight: 110 lb (49.9 kg)   Body mass index is 19.49 kg/m.  BP Readings from Last 3 Encounters:  12/05/23 112/70  08/26/23 122/78  06/05/23 118/85    Wt Readings from Last 3 Encounters:  12/05/23 110 lb (49.9 kg)  09/17/23 111 lb (50.3 kg)  08/26/23 111 lb (50.3 kg)       Physical Exam Constitutional: She appears well-developed and well-nourished. No distress.  HENT:  Head: Normocephalic and atraumatic.  Right Ear: External ear normal. Normal ear canal and TM Left Ear: External ear normal.  Normal ear canal and TM Mouth/Throat: Oropharynx is clear and moist.  Eyes: Conjunctivae normal.  Neck: Neck supple. No tracheal deviation present. No thyromegaly present.  No carotid bruit  Cardiovascular: Normal rate, regular rhythm and normal heart sounds.   No murmur heard.  No edema. Pulmonary/Chest: Effort normal and breath sounds normal. No respiratory distress. She has no wheezes. She has no rales.  Breast: deferred   Abdominal: Soft. She exhibits no distension. There is no tenderness.  Lymphadenopathy: She has  no cervical adenopathy.  Skin: Skin is warm and dry. She is not diaphoretic.  Psychiatric: She has a normal mood and affect. Her behavior is normal.     Lab Results  Component Value Date   WBC 4.7 12/02/2023   HGB 13.6 12/02/2023   HCT 40.4 12/02/2023   PLT 257.0 12/02/2023   GLUCOSE 90 12/02/2023   CHOL 225 (H) 12/02/2023   TRIG 86.0 12/02/2023   HDL 88.40 12/02/2023   LDLCALC 119 (H) 12/02/2023   ALT 16 12/02/2023   AST 18 12/02/2023   NA 138 12/02/2023   K 4.5 12/02/2023   CL 101 12/02/2023   CREATININE 0.74 12/02/2023   BUN 17 12/02/2023   CO2 28 12/02/2023   TSH 0.17 (L)  12/02/2023         Assessment & Plan:   Physical exam: Screening blood work  ordered-reviewed Exercise  regular Weight normal Substance abuse  none   Reviewed recommended immunizations.   Health Maintenance  Topic Date Due   DTaP/Tdap/Td (1 - Tdap) Never done   COVID-19 Vaccine (5 - 2024-25 season) 12/22/2022   DEXA SCAN  03/31/2023   INFLUENZA VACCINE  11/21/2023   Colonoscopy  02/02/2024   Medicare Annual Wellness (AWV)  09/16/2024   Pneumococcal Vaccine: 50+ Years  Completed   Hepatitis C Screening  Completed   HPV VACCINES  Aged Out   Meningococcal B Vaccine  Aged Out   Pneumococcal Vaccine  Discontinued   Zoster Vaccines- Shingrix  Discontinued          See Problem List for Assessment and Plan of chronic medical problems.

## 2023-12-05 ENCOUNTER — Ambulatory Visit (INDEPENDENT_AMBULATORY_CARE_PROVIDER_SITE_OTHER): Admitting: Internal Medicine

## 2023-12-05 VITALS — BP 112/70 | HR 60 | Temp 98.0°F | Ht 63.0 in | Wt 110.0 lb

## 2023-12-05 DIAGNOSIS — N952 Postmenopausal atrophic vaginitis: Secondary | ICD-10-CM

## 2023-12-05 DIAGNOSIS — E0590 Thyrotoxicosis, unspecified without thyrotoxic crisis or storm: Secondary | ICD-10-CM | POA: Diagnosis not present

## 2023-12-05 DIAGNOSIS — I10 Essential (primary) hypertension: Secondary | ICD-10-CM | POA: Diagnosis not present

## 2023-12-05 DIAGNOSIS — E2839 Other primary ovarian failure: Secondary | ICD-10-CM | POA: Diagnosis not present

## 2023-12-05 DIAGNOSIS — M85852 Other specified disorders of bone density and structure, left thigh: Secondary | ICD-10-CM

## 2023-12-05 DIAGNOSIS — Z0000 Encounter for general adult medical examination without abnormal findings: Secondary | ICD-10-CM | POA: Diagnosis not present

## 2023-12-05 NOTE — Assessment & Plan Note (Signed)
 Chronic Prefers not to take any medication DEXA due - ordered Continue regular exercise Continue calcium  and vitamin D  vitamin D  level is good

## 2023-12-05 NOTE — Assessment & Plan Note (Signed)
Chronic Blood pressure well controlled Recent CMP reviewed-kidney function and potassium normal Continue lisinopril 2.5 mg daily

## 2023-12-05 NOTE — Assessment & Plan Note (Signed)
 Chronic Has low TSH Antibodies negative Asymptomatic Has osteopenia with high frax Will refer to endocrine for their input given severe osteopenia

## 2023-12-05 NOTE — Assessment & Plan Note (Signed)
 Did not end up using estrace  Monitor for UTI

## 2023-12-07 DIAGNOSIS — N390 Urinary tract infection, site not specified: Secondary | ICD-10-CM | POA: Diagnosis not present

## 2023-12-07 DIAGNOSIS — R300 Dysuria: Secondary | ICD-10-CM | POA: Diagnosis not present

## 2023-12-07 DIAGNOSIS — R350 Frequency of micturition: Secondary | ICD-10-CM | POA: Diagnosis not present

## 2023-12-23 ENCOUNTER — Ambulatory Visit (INDEPENDENT_AMBULATORY_CARE_PROVIDER_SITE_OTHER)
Admission: RE | Admit: 2023-12-23 | Discharge: 2023-12-23 | Disposition: A | Discharge status: Home / Self Care | Source: Ambulatory Visit | Attending: Internal Medicine | Admitting: Internal Medicine

## 2023-12-23 DIAGNOSIS — E2839 Other primary ovarian failure: Secondary | ICD-10-CM | POA: Diagnosis not present

## 2023-12-23 DIAGNOSIS — M85852 Other specified disorders of bone density and structure, left thigh: Secondary | ICD-10-CM

## 2023-12-24 ENCOUNTER — Ambulatory Visit
Admission: RE | Admit: 2023-12-24 | Discharge: 2023-12-24 | Disposition: A | Discharge status: Home / Self Care | Source: Ambulatory Visit | Attending: Internal Medicine

## 2023-12-24 DIAGNOSIS — Z1231 Encounter for screening mammogram for malignant neoplasm of breast: Secondary | ICD-10-CM

## 2023-12-25 ENCOUNTER — Ambulatory Visit: Payer: Self-pay | Admitting: Internal Medicine

## 2023-12-25 DIAGNOSIS — M85852 Other specified disorders of bone density and structure, left thigh: Secondary | ICD-10-CM

## 2023-12-29 DIAGNOSIS — H26493 Other secondary cataract, bilateral: Secondary | ICD-10-CM | POA: Diagnosis not present

## 2023-12-29 DIAGNOSIS — H04123 Dry eye syndrome of bilateral lacrimal glands: Secondary | ICD-10-CM | POA: Diagnosis not present

## 2024-01-29 ENCOUNTER — Encounter: Payer: Self-pay | Admitting: Internal Medicine

## 2024-01-29 ENCOUNTER — Encounter (HOSPITAL_COMMUNITY): Payer: Self-pay

## 2024-01-29 ENCOUNTER — Observation Stay (HOSPITAL_COMMUNITY)
Admission: EM | Admit: 2024-01-29 | Discharge: 2024-01-30 | Disposition: A | Discharge status: Home / Self Care | Source: Ambulatory Visit | Attending: Family Medicine | Admitting: Family Medicine

## 2024-01-29 ENCOUNTER — Other Ambulatory Visit: Payer: Self-pay | Admitting: Internal Medicine

## 2024-01-29 ENCOUNTER — Other Ambulatory Visit: Payer: Self-pay

## 2024-01-29 ENCOUNTER — Emergency Department (HOSPITAL_COMMUNITY)

## 2024-01-29 ENCOUNTER — Ambulatory Visit (INDEPENDENT_AMBULATORY_CARE_PROVIDER_SITE_OTHER): Admitting: Internal Medicine

## 2024-01-29 ENCOUNTER — Ambulatory Visit: Payer: Self-pay

## 2024-01-29 VITALS — BP 164/106 | HR 73 | Temp 97.5°F | Resp 16 | Ht 63.0 in | Wt 111.0 lb

## 2024-01-29 DIAGNOSIS — R4781 Slurred speech: Secondary | ICD-10-CM | POA: Diagnosis not present

## 2024-01-29 DIAGNOSIS — I119 Hypertensive heart disease without heart failure: Secondary | ICD-10-CM | POA: Insufficient documentation

## 2024-01-29 DIAGNOSIS — J341 Cyst and mucocele of nose and nasal sinus: Secondary | ICD-10-CM | POA: Diagnosis not present

## 2024-01-29 DIAGNOSIS — Z79899 Other long term (current) drug therapy: Secondary | ICD-10-CM | POA: Diagnosis not present

## 2024-01-29 DIAGNOSIS — R531 Weakness: Secondary | ICD-10-CM | POA: Diagnosis not present

## 2024-01-29 DIAGNOSIS — I161 Hypertensive emergency: Secondary | ICD-10-CM

## 2024-01-29 DIAGNOSIS — I10 Essential (primary) hypertension: Secondary | ICD-10-CM | POA: Diagnosis not present

## 2024-01-29 DIAGNOSIS — R4701 Aphasia: Secondary | ICD-10-CM | POA: Diagnosis present

## 2024-01-29 DIAGNOSIS — Z743 Need for continuous supervision: Secondary | ICD-10-CM | POA: Diagnosis not present

## 2024-01-29 DIAGNOSIS — I6622 Occlusion and stenosis of left posterior cerebral artery: Secondary | ICD-10-CM | POA: Diagnosis not present

## 2024-01-29 DIAGNOSIS — R262 Difficulty in walking, not elsewhere classified: Secondary | ICD-10-CM | POA: Insufficient documentation

## 2024-01-29 DIAGNOSIS — R2990 Unspecified symptoms and signs involving the nervous system: Secondary | ICD-10-CM | POA: Diagnosis not present

## 2024-01-29 DIAGNOSIS — F10920 Alcohol use, unspecified with intoxication, uncomplicated: Secondary | ICD-10-CM | POA: Insufficient documentation

## 2024-01-29 DIAGNOSIS — I700 Atherosclerosis of aorta: Secondary | ICD-10-CM | POA: Insufficient documentation

## 2024-01-29 DIAGNOSIS — G459 Transient cerebral ischemic attack, unspecified: Principal | ICD-10-CM | POA: Diagnosis present

## 2024-01-29 DIAGNOSIS — R29898 Other symptoms and signs involving the musculoskeletal system: Secondary | ICD-10-CM | POA: Diagnosis not present

## 2024-01-29 DIAGNOSIS — I6782 Cerebral ischemia: Secondary | ICD-10-CM | POA: Diagnosis not present

## 2024-01-29 DIAGNOSIS — I6523 Occlusion and stenosis of bilateral carotid arteries: Secondary | ICD-10-CM | POA: Diagnosis not present

## 2024-01-29 DIAGNOSIS — I672 Cerebral atherosclerosis: Secondary | ICD-10-CM | POA: Diagnosis not present

## 2024-01-29 DIAGNOSIS — R29810 Facial weakness: Secondary | ICD-10-CM | POA: Diagnosis present

## 2024-01-29 DIAGNOSIS — J90 Pleural effusion, not elsewhere classified: Secondary | ICD-10-CM | POA: Insufficient documentation

## 2024-01-29 LAB — COMPREHENSIVE METABOLIC PANEL WITH GFR
ALT: 20 U/L (ref 0–44)
AST: 25 U/L (ref 15–41)
Albumin: 4.6 g/dL (ref 3.5–5.0)
Alkaline Phosphatase: 61 U/L (ref 38–126)
Anion gap: 12 (ref 5–15)
BUN: 17 mg/dL (ref 8–23)
CO2: 24 mmol/L (ref 22–32)
Calcium: 9.4 mg/dL (ref 8.9–10.3)
Chloride: 102 mmol/L (ref 98–111)
Creatinine, Ser: 0.74 mg/dL (ref 0.44–1.00)
GFR, Estimated: >60 mL/min (ref >60)
Glucose, Bld: 88 mg/dL (ref 70–99)
Potassium: 4.3 mmol/L (ref 3.5–5.1)
Sodium: 138 mmol/L (ref 135–145)
Total Bilirubin: 0.5 mg/dL (ref 0.0–1.2)
Total Protein: 7.3 g/dL (ref 6.5–8.1)

## 2024-01-29 LAB — CBC WITH DIFFERENTIAL/PLATELET
Abs Immature Granulocytes: 0.01 K/uL (ref 0.00–0.07)
Basophils Absolute: 0 K/uL (ref 0.0–0.1)
Basophils Relative: 0 %
Eosinophils Absolute: 0 K/uL (ref 0.0–0.5)
Eosinophils Relative: 0 %
HCT: 42 % (ref 36.0–46.0)
Hemoglobin: 13.6 g/dL (ref 12.0–15.0)
Immature Granulocytes: 0 %
Lymphocytes Relative: 17 %
Lymphs Abs: 0.8 K/uL (ref 0.7–4.0)
MCH: 32.5 pg (ref 26.0–34.0)
MCHC: 32.4 g/dL (ref 30.0–36.0)
MCV: 100.5 fL — ABNORMAL HIGH (ref 80.0–100.0)
Monocytes Absolute: 0.3 K/uL (ref 0.1–1.0)
Monocytes Relative: 5 %
Neutro Abs: 3.7 K/uL (ref 1.7–7.7)
Neutrophils Relative %: 78 %
Platelets: 237 K/uL (ref 150–400)
RBC: 4.18 MIL/uL (ref 3.87–5.11)
RDW: 12.7 % (ref 11.5–15.5)
WBC: 4.9 K/uL (ref 4.0–10.5)
nRBC: 0 % (ref 0.0–0.2)

## 2024-01-29 LAB — URINALYSIS, ROUTINE W REFLEX MICROSCOPIC
Bilirubin Urine: NEGATIVE
Glucose, UA: NEGATIVE mg/dL
Hgb urine dipstick: NEGATIVE
Ketones, ur: 20 mg/dL — AB
Leukocytes,Ua: NEGATIVE
Nitrite: NEGATIVE
Protein, ur: NEGATIVE mg/dL
Specific Gravity, Urine: 1.015 (ref 1.005–1.030)
pH: 5 (ref 5.0–8.0)

## 2024-01-29 LAB — HEMOGLOBIN A1C
Hgb A1c MFr Bld: 5 % (ref 4.8–5.6)
Mean Plasma Glucose: 96.8 mg/dL

## 2024-01-29 LAB — T4, FREE: Free T4: 0.79 ng/dL (ref 0.61–1.12)

## 2024-01-29 LAB — TSH: TSH: 0.224 u[IU]/mL — ABNORMAL LOW (ref 0.350–4.500)

## 2024-01-29 LAB — CBG MONITORING, ED: Glucose-Capillary: 79 mg/dL (ref 70–99)

## 2024-01-29 MED ORDER — ACETAMINOPHEN 325 MG PO TABS: 650.0000 mg | ORAL_TABLET | Freq: Four times a day (QID) | ORAL | Status: DC | PRN

## 2024-01-29 MED ORDER — TRAZODONE HCL 50 MG PO TABS
25.0000 mg | ORAL_TABLET | Freq: Every evening | ORAL | Status: DC | PRN
  Administered 2024-01-29: 25 mg via ORAL
  Filled 2024-01-29: qty 1

## 2024-01-29 MED ORDER — ALBUTEROL SULFATE (2.5 MG/3ML) 0.083% IN NEBU: 2.5000 mg | INHALATION_SOLUTION | RESPIRATORY_TRACT | Status: DC | PRN

## 2024-01-29 MED ORDER — STROKE: EARLY STAGES OF RECOVERY BOOK
Freq: Once | Status: AC
  Filled 2024-01-29 (×2): qty 1

## 2024-01-29 MED ORDER — ONDANSETRON HCL 4 MG PO TABS: 4.0000 mg | ORAL_TABLET | Freq: Four times a day (QID) | ORAL | Status: DC | PRN

## 2024-01-29 MED ORDER — ONDANSETRON HCL 4 MG/2ML IJ SOLN: 4.0000 mg | Freq: Four times a day (QID) | INTRAMUSCULAR | Status: DC | PRN

## 2024-01-29 MED ORDER — ENOXAPARIN SODIUM 40 MG/0.4ML IJ SOSY
40.0000 mg | PREFILLED_SYRINGE | INTRAMUSCULAR | Status: DC
  Filled 2024-01-29: qty 0.4

## 2024-01-29 MED ORDER — ACETAMINOPHEN 650 MG RE SUPP: 650.0000 mg | Freq: Four times a day (QID) | RECTAL | Status: DC | PRN

## 2024-01-29 MED ORDER — IOHEXOL 350 MG/ML SOLN
75.0000 mL | Freq: Once | INTRAVENOUS | Status: AC | PRN
Start: 2024-01-29 — End: 2024-01-29
  Administered 2024-01-29: 75 mL via INTRAVENOUS

## 2024-01-29 NOTE — ED Notes (Addendum)
 Swallow screen conducted. Pt passed.   Writer gave pt ice water and a [place] sandwich. Writer got approval from hospitalist first.

## 2024-01-29 NOTE — Progress Notes (Signed)
 PT Note  Patient Details Name: Margaret Craig MRN: [id number] DOB: [birth year]     PT order, see pt being admitted possible to [facility] for TIA work up. If patient is still here at [facility] tomorrow , will assess for PT in the morning. Thank you    MILLICENT SALES 01/29/2024, 5:17 PM SALES, PT, MPT Acute Rehabilitation Services Office: [phone] If a weekend: secure chat groups: [facility] PT, [facility] OT, [facility] SLP 01/29/2024

## 2024-01-29 NOTE — Telephone Encounter (Signed)
 FYI Only or Action Required?: FYI only for provider.  Patient was last seen in primary care on 12/05/2023 by Geofm Glade PARAS, MD.  Called Nurse Triage reporting Fatigue.  Symptoms began yesterday.  Interventions attempted: Rest, hydration, or home remedies.  Symptoms are: stable.  Triage Disposition: See HCP Within 4 Hours (Or PCP Triage)  Patient/caregiver understands and will follow disposition?: Yes          Copied from CRM #[id number]. Topic: Clinical - Red Word Triage >> Jan 29, 2024  8:36 AM Viola F wrote: Red Word that prompted transfer to Nurse Triage: Patient needed help getting to car lastnight, her blood pressure 130/85 this morning and she has no appetite. Requested appt for today Reason for Disposition  [1] MODERATE weakness (e.g., interferes with work, school, normal activities) AND [2] cause unknown  (Exceptions: Weakness from acute minor illness or poor fluid intake; weakness is chronic and not worse.)    Pt reports generalized weakness where she required assistance walking last night after dinner. Pt woke up this AM at baseline. Denies any other sx aside from poor appetite. Scheduled with alternate provider.  Answer Assessment - Initial Assessment Questions 1. DESCRIPTION: Describe how you are feeling.     Out of kilter - endorsed needing assistance to the car.  I feel really off 2. SEVERITY: How bad is it?  Can you stand and walk?     It felt like my legs didn't want to hold me up. Today BP 130/85. This AM is back to baseline, able to walk. 3. ONSET: When did these symptoms begin? (e.g., hours, days, weeks, months)     Last night 4. CAUSE: What do you think is causing the weakness or fatigue? (e.g., not drinking enough fluids, medical problem, trouble sleeping)     Unknown. 5. NEW MEDICINES:  Have you started on any new medicines recently? (e.g., opioid pain medicines, benzodiazepines, muscle relaxants, antidepressants, antihistamines,  neuroleptics, beta blockers)     N/a 6. OTHER SYMPTOMS: Do you have any other symptoms? (e.g., chest pain, fever, cough, SOB, vomiting, diarrhea, bleeding, other areas of pain)     Poor appetite. 7. PREGNANCY: Is there any chance you are pregnant? When was your last menstrual period?     N/a  Answer Assessment - Initial Assessment Questions 1. SYMPTOM: What is the main symptom you are concerned about? (e.g., weakness, numbness)     weakness 2. ONSET: When did this start? (e.g., minutes, hours, days; while sleeping)     Last night after dinner 3. LAST NORMAL: When was the last time you (the patient) were normal (no symptoms)?     This AM 4. PATTERN Does this come and go, or has it been constant since it started?  Is it present now?     X 1 event after dinner. This AM woke up at baseline 5. CARDIAC SYMPTOMS: Have you had any of the following symptoms: chest pain, difficulty breathing, palpitations?     denies 6. NEUROLOGIC SYMPTOMS: Have you had any of the following symptoms: headache, dizziness, vision loss, double vision, changes in speech, unsteady on your feet?     Unsteady on feet last night. 7. OTHER SYMPTOMS: Do you have any other symptoms?     Poor appetite 8. PREGNANCY: Is there any chance you are pregnant? When was your last menstrual period?     N/a  Protocols used: Weakness (Generalized) and Fatigue-A-AH, Neurologic Deficit-A-AH

## 2024-01-29 NOTE — Patient Instructions (Signed)
 Transient Ischemic Attack A transient ischemic attack (TIA) happens when blood supply to the brain is blocked temporarily. A TIA causes stroke-like symptoms that go away quickly without causing any permanent damage. Having a TIA can be considered a warning sign for a stroke and should not be ignored. A person who has a TIA is at higher risk for a stroke. What are the causes? This condition is caused by a temporary blockage in an artery in the head or neck. This means the brain does not get the blood supply it needs. A blockage can be caused by: Fatty buildup in an artery in the head or neck (atherosclerosis). A blood clot traveling from the heart. An artery tear (dissection). Inflammation of an artery (vasculitis). Sometimes the cause is not known. What increases the risk? Certain factors make you more likely to develop this condition. Some of these are things you can change, including: Using products that contain nicotine or tobacco. Being inactive. Heavy alcohol use. Drug use, especially cocaine and methamphetamine. Medical conditions that may increase your risk include: High blood pressure (hypertension). High cholesterol. Diabetes. Heart disease (coronary artery disease). An irregular heartbeat, also called atrial fibrillation (AFib). Sickle cell disease. Blood clotting disorders (hypercoagulable state). Other risk factors include: Being over the age of 45. Being female. Obesity. Sleep problems such as sleep apnea. Family history of stroke. Previous history of blood clots, stroke, TIA, or heart attack. What are the signs or symptoms? Symptoms of a TIA are the same as those of a stroke. The symptoms develop suddenly, and then go away quickly. They may include: Dizziness, loss of balance and coordination, or trouble walking. Vision changes, such as double vision, blurred vision, or loss of vision. Weakness or numbness in your face, arm, or leg, especially on one side of your  body. Trouble speaking, understanding speech, or both (aphasia). Nausea and vomiting. Severe headache. Confusion. If possible, note what time your symptoms started. Tell your health care provider. How is this diagnosed? This condition may be diagnosed based on: Your symptoms and medical history. A physical exam. Imaging tests, usually a CT scan or MRI of the brain. Blood tests. You may also have other tests, including: Electrocardiogram (ECG). Echocardiogram. Continuous heart monitoring. Carotid ultrasound. A scan of blood circulation in the brain (CT angiogram or MR angiogram). How is this treated? The goal of treatment is to reduce the risk for a stroke. Stroke prevention therapies may include: Changes to diet and lifestyle, such as being physically active and stopping smoking. Treating other health conditions, such as diabetes or AFib. Medicines to thin the blood (antiplatelets or anticoagulants). Blood pressure medicines. Medicines to reduce cholesterol. If testing shows a narrowing in the arteries to your brain, your health care provider may recommend a procedure, such as: Carotid endarterectomy. This is done to remove the blockage from your artery. Carotid angioplasty and stenting. This uses a small mesh tube (stent) to open or widen an artery in the neck. The stent helps keep the artery open by supporting the artery walls. Follow these instructions at home: Medicines Take over-the-counter and prescription medicines only as told by your health care provider. If you were told to take a medicine to thin your blood, such as aspirin or an anticoagulant, use it exactly as told by your health care provider. Taking too much blood-thinning medicine can cause bleeding. Taking too little will not protect you against a stroke and other problems. Eating and drinking Eat 5 or more servings of fruits and vegetables  each day. Follow guidelines from your health care provider about your diet.  You may need to follow a certain diet to help manage risk factors for stroke. This may include: Eating a low-fat, low-salt diet. Choosing high-fiber foods. Limiting carbohydrates and sugar. If you drink alcohol: Limit how much you have to: 0-1 drink a day for women who are not pregnant. 0-2 drinks a day for men. Know how much alcohol is in your drink. In the [place] one drink equals one 12 oz bottle of beer (355 mL), one 5 oz glass of wine (148 mL), or one 1 oz glass of hard liquor (44 mL). General instructions Maintain a healthy weight. Try to get at least 30 minutes of exercise on most days. Get treatment if you have sleep apnea. Do not use any products that contain nicotine or tobacco. These products include cigarettes, chewing tobacco, and vaping devices, such as e-cigarettes. If you need help quitting, ask your health care provider. Do not use illegal drugs. Keep all follow-up visits. Your health care provider will want to know if you have any more symptoms and to check blood labs if any medicines were prescribed. Where to find more information American Stroke Association: stroke.org Get help right away if: You have chest pain. You have fast or irregular heartbeats (palpitations). You have any symptoms of a stroke. "BE FAST" is an easy way to remember the main warning signs of a stroke. B - Balance. Signs are dizziness, sudden trouble walking, or loss of balance. E - Eyes. Signs are trouble seeing or a sudden change in vision. F - Face. Signs are sudden weakness or numbness of the face, or the face or eyelid drooping on one side. A - Arms. Signs are weakness or numbness in an arm. This happens suddenly and usually on one side of the body. S - Speech.Signs are sudden trouble speaking, slurred speech, or trouble understanding what people say. T - Time. Time to call emergency services. Write down what time symptoms started. You have other signs of a stroke, such as: A sudden, severe  headache with no known cause. Nausea or vomiting. Seizure. These symptoms may be an emergency. Get help right away. Call 911. Do not wait to see if the symptoms will go away. Do not drive yourself to the hospital. This information is not intended to replace advice given to you by your health care provider. Make sure you discuss any questions you have with your health care provider. Document Revised: 09/21/2021 Document Reviewed: 09/21/2021 Elsevier Patient Education  2024 [employer].

## 2024-01-29 NOTE — H&P (Signed)
 History and Physical  Margaret Craig FMW:[id number] DOB: 1945-08-30 DOA: 01/29/2024  PCP: Geofm Glade PARAS, MD   Chief Complaint: Weakness, dysarthria  HPI: Margaret Craig is a 78 y.o. female with medical history significant for hypertension being admitted to the hospital for TIA workup.  History is provided by the patient, as well as my discussion with the ER provider.  Patient states that she was out to dinner with a couple of friends when she had relatively sudden onset of generalized weakness, particularly in her lower extremities.  She had taken her shoes off during the meal, and when it was time to leave, she had a loss of coordination and weakness, making it difficult for her to put her shoes on.  Her friend had to practically hold her up to leave the restaurant.  Patient states she was not confused, but she was bewildered about her symptoms.  She denies any choking or coughing, any slurred speech.  However her friend who was present, and spoke to the ER doctor, related that she may have had some facial droop and word finding difficulty as well as dysarthria.  Patient went home and went to sleep, by the time she woke up this morning her symptoms were completely resolved.  Now she is completely asymptomatic.  She came to the ER today for evaluation, where workup as detailed below was completely benign.  ER provider discussed with Dr. Matthews who recommends admission to [facility] to complete her TIA workup.  Patient denies any recent illness, fevers, chills, her only scheduled prescription medication is lisinopril  which she has been taking for many years without any significant interruption.  Review of Systems: Please see HPI for pertinent positives and negatives. A complete 10 system review of systems are otherwise negative.  Past Medical History:  Diagnosis Date   Allergy    Arthritis    Colon polyps    Fainting spell    Hypertension    PONV (postoperative nausea and  vomiting)    Thyroid  disease    UTI (urinary tract infection)    Past Surgical History:  Procedure Laterality Date   ANTERIOR LAT LUMBAR FUSION Left 06/04/2023   Procedure: LEFT-SIDED LATERAL INTERBODY FUSION LUMBAR FOUR - LUMBAR FIVE WITH INSTRUMENTATION AND ALLOGRAFT;  Surgeon: Beuford Anes, MD;  Location: [facility] OR;  Service: Orthopedics;  Laterality: Left;   COLONOSCOPY     POSTERIOR FUSION PEDICLE SCREW PLACEMENT N/A 06/04/2023   Procedure: POSTERIOR SPINAL FUSION LUMBAR FOUR - LUMBAR FIVE WITH INSTRUMENTATION AND ALLOGRAFT;  Surgeon: Beuford Anes, MD;  Location: [facility] OR;  Service: Orthopedics;  Laterality: N/A;   SINUS IRRIGATION     TONSILLECTOMY     Social History:  reports that she has never smoked. She has never used smokeless tobacco. She reports current alcohol use of about 2.0 standard drinks of alcohol per week. She reports that she does not use drugs.  Allergies  Allergen Reactions   Wasp Venom Anaphylaxis   Codeine Other (See Comments)    Can't think straight and insomnia     Family History  Problem Relation Age of Onset   Mental illness Mother    Colon cancer Mother 69   Cancer Maternal Grandmother        breast   Heart disease Maternal Grandfather    Alcohol abuse Son    Mental illness Son    Heart disease Maternal Aunt    Colon cancer Maternal Aunt        diagnosed in her  70s   Breast cancer Maternal Aunt    Heart disease Paternal Aunt    Alcohol abuse Paternal Uncle    Esophageal cancer Neg Hx    Stomach cancer Neg Hx    Rectal cancer Neg Hx      Prior to Admission medications   Medication Sig Start Date End Date Taking? Authorizing Provider  ascorbic acid (VITAMIN C ) 500 MG tablet Take 500 mg by mouth daily.   Yes [provider]  BIOTIN PO Take 1 capsule by mouth daily.   Yes [provider]  Calcium -Magnesium-Vitamin D  (CITRACAL CALCIUM  +D3 PO) Take 1 tablet by mouth in the morning and at bedtime.   Yes [provider]   EPINEPHrine  0.3 mg/0.3 mL IJ SOAJ injection INJECT INTO THE MIDDLE OF THE OUTER THIGH AND HOLD FOR 10 SECONDS AS NEEDED FOR SEVERE ALLERGIC REACTION THEN CALL 911 IF USED Patient taking differently: Inject 0.3 mg into the muscle See admin instructions. INJECT 0.3 MG INTO THE MIDDLE OF THE OUTER THIGH AND HOLD FOR 10 SECONDS AS NEEDED FOR A SEVERE ALLERGIC REACTION. THEN, CALL 911 IF USED. 08/14/23  Yes Burns, Glade PARAS, MD  lisinopril  (ZESTRIL ) 5 MG tablet TAKE A HALF TABLET BY MOUTH DAILY Patient taking differently: Take 2.5 mg by mouth in the morning. 01/29/24  Yes Burns, Glade PARAS, MD  Multiple Vitamins-Minerals (MULTIVITAMIN WITH MINERALS) tablet Take 1 tablet by mouth daily with breakfast.   Yes [provider]  TYLENOL  500 MG tablet Take 500-1,000 mg by mouth every 8 (eight) hours as needed for mild pain (pain score 1-3) (or headaches).   Yes [provider]  Zinc  30 MG CAPS Take 30 mg by mouth daily.   Yes [provider]    Physical Exam: BP (!) 173/99 (BP Location: Left Arm)   Pulse 75   Temp 97.6 F (36.4 C) (Oral)   Resp 18   Ht 5' 4 (1.626 m)   Wt 49.9 kg   SpO2 99%   BMI 18.88 kg/m  General:  Alert, oriented, calm, in no acute distress  Eyes: EOMI, clear conjuctivae, Meester sclerea Neck: supple, no masses, trachea mildline  Cardiovascular: RRR, no murmurs or rubs, no peripheral edema  Respiratory: clear to auscultation bilaterally, no wheezes, no crackles  Abdomen: soft, nontender, nondistended, normal bowel tones heard  Skin: dry, no rashes  Musculoskeletal: no joint effusions, normal range of motion  Psychiatric: appropriate affect, normal speech  Neurologic: extraocular muscles intact, clear speech, moving all extremities with intact sensorium         Labs on Admission:  Basic Metabolic Panel: Recent Labs  Lab 01/29/24 1105  NA 138  K 4.3  CL 102  CO2 24  GLUCOSE 88  BUN 17  CREATININE 0.74  CALCIUM  9.4   Liver Function  Tests: Recent Labs  Lab 01/29/24 1105  AST 25  ALT 20  ALKPHOS 61  BILITOT 0.5  PROT 7.3  ALBUMIN 4.6   No results for input(s): LIPASE, AMYLASE in the last 168 hours. No results for input(s): AMMONIA in the last 168 hours. CBC: Recent Labs  Lab 01/29/24 1105  WBC 4.9  NEUTROABS 3.7  HGB 13.6  HCT 42.0  MCV 100.5*  PLT 237   Cardiac Enzymes: No results for input(s): CKTOTAL, CKMB, CKMBINDEX, TROPONINI in the last 168 hours. BNP (last 3 results) No results for input(s): BNP in the last 8760 hours.  ProBNP (last 3 results) No results for input(s): PROBNP in the last  8760 hours.  CBG: Recent Labs  Lab 01/29/24 1100  GLUCAP 79    Radiological Exams on Admission: MR BRAIN WO CONTRAST Result Date: 01/29/2024 CLINICAL DATA:  Transient ischemic attack (TIA). Episode of slurred speech and leg weakness yesterday. EXAM: MRI HEAD WITHOUT CONTRAST TECHNIQUE: Multiplanar, multiecho pulse sequences of the brain and surrounding structures were obtained without intravenous contrast. COMPARISON:  CTA head and neck 01/29/2024 FINDINGS: Brain: There is no evidence of an acute infarct, intracranial hemorrhage, mass, midline shift, or extra-axial fluid collection. Cerebral Lenderman matter T2 hyperintensities are nonspecific but compatible with mild chronic small vessel ischemic disease. Cerebral volume is within normal limits for age. The ventricles are normal in size. Vascular: Major intracranial vascular flow voids are preserved. Skull and upper cervical spine: Unremarkable bone marrow signal. Sinuses/Orbits: Postsurgical changes involving the maxillary sinuses. Right sphenoid sinus mucous retention cyst. Moderate mucosal thickening in the left sphenoid sinus. Small left mastoid effusion. Bilateral cataract extraction. Other: None. IMPRESSION: 1. No acute intracranial abnormality. 2. Mild chronic small vessel ischemic disease. Electronically Signed   By: Dasie Hamburg M.D.   On:  01/29/2024 14:45   CT ANGIO HEAD NECK W WO CM Result Date: 01/29/2024 CLINICAL DATA:  Transient ischemic attack (TIA). Episode of slurred speech and leg weakness yesterday. EXAM: CT ANGIOGRAPHY HEAD AND NECK WITH AND WITHOUT CONTRAST TECHNIQUE: Multidetector CT imaging of the head and neck was performed using the standard protocol during bolus administration of intravenous contrast. Multiplanar CT image reconstructions and MIPs were obtained to evaluate the vascular anatomy. Carotid stenosis measurements (when applicable) are obtained utilizing NASCET criteria, using the distal internal carotid diameter as the denominator. RADIATION DOSE REDUCTION: This exam was performed according to the departmental dose-optimization program which includes automated exposure control, adjustment of the mA and/or kV according to patient size and/or use of iterative reconstruction technique. CONTRAST:  75mL OMNIPAQUE IOHEXOL 350 MG/ML SOLN COMPARISON:  Head CT 04/29/2007 and MRI 01/29/2024 FINDINGS: CT HEAD FINDINGS Brain: There is no evidence of an acute infarct, intracranial hemorrhage, mass, midline shift, or extra-axial fluid collection. Cerebral Stephani matter hypodensities are nonspecific but compatible with mild chronic small vessel ischemic disease. Cerebral volume is within normal limits for age. The ventricles are normal in size. Vascular: No hyperdense vessel. Skull: No fracture or suspicious marrow lesion. Sinuses/Orbits: Postsurgical changes involving the maxillary sinuses. Right sphenoid sinus mucous retention cyst. Moderate mucosal thickening in the left sphenoid sinus. Small left mastoid effusion. Bilateral cataract extraction. Other: None. Review of the MIP images confirms the above findings CTA NECK FINDINGS Aortic arch: Normal variant aortic arch branching pattern with common origin of the brachiocephalic and left common carotid arteries. Widely patent brachiocephalic and subclavian arteries. Right carotid system:  Patent with a small amount of calcified plaque at the carotid bifurcation. No evidence of a significant stenosis or dissection. Left carotid system: Patent with a small amount of calcified plaque at the carotid bifurcation. No evidence of a significant stenosis or dissection. Vertebral arteries: Patent and codominant without evidence of stenosis, dissection, or significant atherosclerosis. Skeleton: Cervical disc degeneration, particularly severe at C5-6 and C6-7. Advanced multilevel cervical facet arthrosis. Other neck: Multiple thyroid  nodules including a 2.9 cm nodule inferiorly on the right, previously evaluated by ultrasound. No evidence of cervical lymphadenopathy. Upper chest: Clear lung apices. Review of the MIP images confirms the above findings CTA HEAD FINDINGS Anterior circulation: The internal carotid arteries are widely patent from skull base to carotid termini. ACAs and MCAs are patent without evidence  of a proximal branch occlusion or significant proximal stenosis. Slightly bulbous appearance of the left A1-A2 junction appears to reflect an infundibulum rather than aneurysm. Posterior circulation: The intracranial vertebral arteries are widely patent to the basilar. Patent PICA, AICA, and SCA origins are visualized bilaterally. 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 There are moderate-sized left and diminutive or absent right posterior communicating arteries. Both PCAs are patent without evidence of a significant proximal stenosis. There is moderate irregular narrowing of distal left PCA branch vessels, left greater than right. No aneurysm is identified. Venous sinuses: As permitted by contrast timing, patent. Anatomic variants: None. Review of the MIP images confirms the above findings IMPRESSION: 1. No evidence of acute intracranial abnormality. 2. Mild chronic small vessel ischemic disease. 3. Mild atherosclerosis in the head and neck  without a large vessel occlusion or significant proximal stenosis. Electronically Signed   By: Dasie Hamburg M.D.   On: 01/29/2024 14:39   Assessment/Plan Margaret Craig is a 78 y.o. female with medical history significant for hypertension being admitted to the hospital for TIA workup.   TIA-suspected due to transient dysarthria, reported facial droop and lower extremity desynchronization/weakness.  Symptoms have completely resolved.  Imaging as above unremarkable. -Observation admission to [facility] -Inpatient neurology consultation -Check fasting lipids and hemoglobin A1c -Check TSH due to history of thyroid  disorder  -2D echo -Monitor on telemetry for any arrhythmia -NIH stroke scale per protocol -PT/OT evaluation, patient passed bedside swallow screen no need for SLP evaluation  Hypertension-Will allow for permissive hypertension for the time being, defer to neurology regarding resumption of her lisinopril   DVT prophylaxis: Lovenox     Code Status: Full Code  Consults called: Neurology Dr. Matthews  Admission status: Observation  Time spent: 56 minutes  Robyne Matar CHRISTELLA Gail MD [facility] Pager [phone]  If 7PM-7AM, please contact night-coverage [email or web address] Password [email or web address]  01/29/2024, 4:29 PM

## 2024-01-29 NOTE — ED Triage Notes (Signed)
 Pt reports with an episode of slurred speech and leg weakness last night at dinner. Pt denies any current symptoms.

## 2024-01-29 NOTE — ED Notes (Signed)
 [facility] called.

## 2024-01-29 NOTE — ED Provider Notes (Signed)
 [place] [facility]MENT AT [facility] Provider Note   CSN: [id number] Arrival date & time: 01/29/24  1054     Patient presents with: Aphasia   Margaret Craig is a 78 y.o. female.   This is a 78 year old female presenting emergency department for evaluation of possible TIA.  She went to dinner last night around 530 sometime around 6-6 15 she began having acute confusion, had facial droop, dysarthria and weakness in her bilateral lower extremities.  No vision loss, she denies aphasia, but significant other notes that she did have seemingly hard time getting words out.  No numbness or tingling.  She required significant assistance to ambulate from the restaurant back to the car and into bed.  She woke up this morning and significant other notes that her mouth continued to not look normal, but after she got up and got coffee that normalized.  Patient states she is he is currently back to her baseline, and not having any weakness or symptoms.  No headache.  No recent infections.  No chest pain or shortness of breath.        Prior to Admission medications   Medication Sig Start Date End Date Taking? Authorizing Provider  ascorbic acid (VITAMIN C ) 500 MG tablet Take 500 mg by mouth daily.   Yes [provider]  Calcium  Citrate-Vitamin D  (CALCIUM  + D PO) Take 1 tablet by mouth 2 (two) times daily.   Yes [provider]  EPINEPHrine  0.3 mg/0.3 mL IJ SOAJ injection INJECT INTO THE MIDDLE OF THE OUTER THIGH AND HOLD FOR 10 SECONDS AS NEEDED FOR SEVERE ALLERGIC REACTION THEN CALL 911 IF USED Patient taking differently: Inject 0.3 mg into the muscle See admin instructions.   INJECT 0.3 MG INTO THE MIDDLE OF THE OUTER THIGH AND HOLD FOR 10 SECONDS AS NEEDED FOR SEVERE ALLERGIC REACTION. THEN, CALL 911 IF USED. 08/14/23  Yes Burns, Glade PARAS, MD  lisinopril  (ZESTRIL ) 5 MG tablet TAKE A HALF TABLET BY MOUTH DAILY 01/29/24  Yes Burns, Glade PARAS, MD  Multiple  Vitamins-Minerals (MULTIVITAMIN WITH MINERALS) tablet Take 1 tablet by mouth daily.   Yes [provider]  Zinc  30 MG CAPS Take 30 mg by mouth daily.    [provider]    Allergies: Wasp venom and Codeine    Review of Systems  Updated Vital Signs BP (!) 173/99 (BP Location: Left Arm)   Pulse 75   Temp 97.6 F (36.4 C) (Oral)   Resp 18   Ht 5' 4 (1.626 m)   Wt 49.9 kg   SpO2 99%   BMI 18.88 kg/m   Physical Exam Vitals and nursing note reviewed.  Constitutional:      General: She is not in acute distress.    Appearance: She is not toxic-appearing.  HENT:     Head: Normocephalic.     Nose: Nose normal.     Mouth/Throat:     Mouth: Mucous membranes are moist.  Eyes:     Conjunctiva/sclera: Conjunctivae normal.  Cardiovascular:     Rate and Rhythm: Normal rate and regular rhythm.  Pulmonary:     Effort: Pulmonary effort is normal.     Breath sounds: Normal breath sounds.  Abdominal:     General: Abdomen is flat. Bowel sounds are normal. There is no distension.     Tenderness: There is no abdominal tenderness. There is no guarding or rebound.  Musculoskeletal:        General: Normal range  of motion.  Skin:    General: Skin is warm and dry.     Capillary Refill: Capillary refill takes less than 2 seconds.  Neurological:     General: No focal deficit present.     Mental Status: She is alert and oriented to person, place, and time.     Cranial Nerves: No cranial nerve deficit.     Sensory: No sensory deficit.     Motor: No weakness.     Coordination: Coordination normal.  Psychiatric:        Mood and Affect: Mood normal.        Behavior: Behavior normal.     (all labs ordered are listed, but only abnormal results are displayed) Labs Reviewed  CBC WITH DIFFERENTIAL/PLATELET - Abnormal; Notable for the following components:      Result Value   MCV 100.5 (*)    All other components within normal limits  URINALYSIS, ROUTINE W REFLEX MICROSCOPIC -  Abnormal; Notable for the following components:   Ketones, ur 20 (*)    All other components within normal limits  COMPREHENSIVE METABOLIC PANEL WITH GFR  CBG MONITORING, ED    EKG: EKG Interpretation Date/Time:  Thursday January 29 2024 11:09:01 EDT Ventricular Rate:  69 PR Interval:  128 QRS Duration:  93 QT Interval:  427 QTC Calculation: 458 R Axis:   59  Text Interpretation: Sinus arrhythmia Ventricular premature complex Left atrial enlargement Left ventricular hypertrophy Confirmed by Neysa Clap [id number]) on 01/29/2024 11:28:41 AM  Radiology: MR BRAIN WO CONTRAST Result Date: 01/29/2024 CLINICAL DATA:  Transient ischemic attack (TIA). Episode of slurred speech and leg weakness yesterday. EXAM: MRI HEAD WITHOUT CONTRAST TECHNIQUE: Multiplanar, multiecho pulse sequences of the brain and surrounding structures were obtained without intravenous contrast. COMPARISON:  CTA head and neck 01/29/2024 FINDINGS: Brain: There is no evidence of an acute infarct, intracranial hemorrhage, mass, midline shift, or extra-axial fluid collection. Cerebral [place] matter T2 hyperintensities are nonspecific but compatible with mild chronic small vessel ischemic disease. Cerebral volume is within normal limits for age. The ventricles are normal in size. Vascular: Major intracranial vascular flow voids are preserved. Skull and upper cervical spine: Unremarkable bone marrow signal. Sinuses/Orbits: Postsurgical changes involving the maxillary sinuses. Right sphenoid sinus mucous retention cyst. Moderate mucosal thickening in the left sphenoid sinus. Small left mastoid effusion. Bilateral cataract extraction. Other: None. IMPRESSION: 1. No acute intracranial abnormality. 2. Mild chronic small vessel ischemic disease. Electronically Signed   By: Dasie Hamburg M.D.   On: 01/29/2024 14:45   CT ANGIO HEAD NECK W WO CM Result Date: 01/29/2024 CLINICAL DATA:  Transient ischemic attack (TIA). Episode of slurred speech and  leg weakness yesterday. EXAM: CT ANGIOGRAPHY HEAD AND NECK WITH AND WITHOUT CONTRAST TECHNIQUE: Multidetector CT imaging of the head and neck was performed using the standard protocol during bolus administration of intravenous contrast. Multiplanar CT image reconstructions and MIPs were obtained to evaluate the vascular anatomy. Carotid stenosis measurements (when applicable) are obtained utilizing NASCET criteria, using the distal internal carotid diameter as the denominator. RADIATION DOSE REDUCTION: This exam was performed according to the departmental dose-optimization program which includes automated exposure control, adjustment of the mA and/or kV according to patient size and/or use of iterative reconstruction technique. CONTRAST:  75mL OMNIPAQUE IOHEXOL 350 MG/ML SOLN COMPARISON:  Head CT 04/29/2007 and MRI 01/29/2024 FINDINGS: CT HEAD FINDINGS Brain: There is no evidence of an acute infarct, intracranial hemorrhage, mass, midline shift, or extra-axial fluid collection. Cerebral [place] matter hypodensities  are nonspecific but compatible with mild chronic small vessel ischemic disease. Cerebral volume is within normal limits for age. The ventricles are normal in size. Vascular: No hyperdense vessel. Skull: No fracture or suspicious marrow lesion. Sinuses/Orbits: Postsurgical changes involving the maxillary sinuses. Right sphenoid sinus mucous retention cyst. Moderate mucosal thickening in the left sphenoid sinus. Small left mastoid effusion. Bilateral cataract extraction. Other: None. Review of the MIP images confirms the above findings CTA NECK FINDINGS Aortic arch: Normal variant aortic arch branching pattern with common origin of the brachiocephalic and left common carotid arteries. Widely patent brachiocephalic and subclavian arteries. Right carotid system: Patent with a small amount of calcified plaque at the carotid bifurcation. No evidence of a significant stenosis or dissection. Left carotid system:  Patent with a small amount of calcified plaque at the carotid bifurcation. No evidence of a significant stenosis or dissection. Vertebral arteries: Patent and codominant without evidence of stenosis, dissection, or significant atherosclerosis. Skeleton: Cervical disc degeneration, particularly severe at C5-6 and C6-7. Advanced multilevel cervical facet arthrosis. Other neck: Multiple thyroid  nodules including a 2.9 cm nodule inferiorly on the right, previously evaluated by ultrasound. No evidence of cervical lymphadenopathy. Upper chest: Clear lung apices. Review of the MIP images confirms the above findings CTA HEAD FINDINGS Anterior circulation: The internal carotid arteries are widely patent from skull base to carotid termini. ACAs and MCAs are patent without evidence of a proximal branch occlusion or significant proximal stenosis. Slightly bulbous appearance of the left A1-A2 junction appears to reflect an infundibulum rather than aneurysm. Posterior circulation: The intracranial vertebral arteries are widely patent to the basilar. Patent PICA, AICA, and SCA origins are visualized bilaterally. 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 There are moderate-sized left and diminutive or absent right posterior communicating arteries. Both PCAs are patent without evidence of a significant proximal stenosis. There is moderate irregular narrowing of distal left PCA branch vessels, left greater than right. No aneurysm is identified. Venous sinuses: As permitted by contrast timing, patent. Anatomic variants: None. Review of the MIP images confirms the above findings IMPRESSION: 1. No evidence of acute intracranial abnormality. 2. Mild chronic small vessel ischemic disease. 3. Mild atherosclerosis in the head and neck without a large vessel occlusion or significant proximal stenosis. Electronically Signed   By: Dasie Hamburg M.D.   On: 01/29/2024 14:39      Procedures   Medications Ordered in the ED  iohexol (OMNIPAQUE) 350 MG/ML injection 75 mL (75 mLs Intravenous Contrast Given 01/29/24 1300)    Clinical Course as of 01/29/24 1526  Thu Jan 29, 2024  1502 [facility]mit to [facility] for [facility]IA workup per Dr. Matthews [TY]    Clinical Course User Index [TY] Neysa Caron PARAS, DO                                 Medical Decision Making This is a 78 year old female with past medical history to include thyroid  disease, hypertension presenting the emergency department for possible TIA.  She is hypertensive here 172/92, afebrile nontachycardic maintaining oxygen saturation on room air.  She reports that she is asymptomatic currently and without complaint.  Physical exam without focal neurologic deficit.  History concerning for possible TIA versus near syncope versus alcohol  Amount and/or Complexity of Data Reviewed Independent Historian:     Details: Significant other noted significant facial droop, but cannot recall which side, dysarthria, generalized  weakness.  Also notes that she had markedly elevated blood pressure last night and 180 systolic.  Also notes that she had 2 glasses of wine last night, but that she frequently had 2 glasses of wine and never had the symptoms before. External Data Reviewed:     Details: Not on a blood thinner Labs: ordered. Decision-making details documented in ED Course.    Details: Screening labs ordered no leukocytosis to suggest infectious process.  Glucose normal.  Comprehensive panel without metabolic derangements.  No transaminitis to suggest P[place] biliary disease.  UA negative. Radiology: ordered and independent interpretation performed.    Details: Do not appreciate obvious intracranial hemorrhage on CTA ECG/medicine tests:     Details: To be sinus rhythm Discussion of management or test interpretation with external provider(s): Neurology, recommending admission to [facility] for TIA workup.  Risk Prescription drug  management. Decision regarding hospitalization. Diagnosis or treatment significantly limited by social determinants of health.      Final diagnoses:  None    ED Discharge Orders     None          Neysa Caron PARAS, DO 01/29/24 1526

## 2024-01-29 NOTE — Progress Notes (Unsigned)
 Subjective:  Patient ID: Margaret Craig, female    DOB: 03/09/1946  Age: 78 y.o. MRN: [id number]  CC: Weakness (Patient states that last night when she was leaving dinner she was having trouble walking and felt out of sorts. Also she was having a difficult time putting her shoes on while at dinner and was having some trouble walking. Her husband had to carry her upstairs and he could tell that she was disoriented. She complained of having anxiety. This morning she was still having trouble with her legs. )   HPI Margaret Craig presents for f/up ---    Discussed the use of AI scribe software for clinical note transcription with the patient, who gave verbal consent to proceed.  History of Present Illness Margaret Craig is a 78 year old female with hypertension who presents with stroke-like symptoms. She is accompanied by her husband who witnessed the events.  She experienced sudden weakness and difficulty standing after dinner last night, requiring assistance to walk and stand. Her husband observed unusual facial expressions and slurred speech. She felt weak all over and had a 'sinking spell' upon returning home.  During the episode, her blood pressure was measured at 180/140. She felt anxious and was given an Ativan to help her sleep, after which she slept through the night without issues. This morning, her husband noted that her speech was slurred and her facial expressions were not normal for about 15-20 minutes after getting out of bed, although she did not notice these changes herself.  No headache, vision problems, or difficulty swallowing. There was no vomiting, though she felt slightly nauseous this morning and had no appetite, stating that the thought of eating made her cry. No cough, fever, chills, or sore throat. She took a long walk the day before the incident without any issues.  Her blood pressure was measured again this morning at 130/80, but she  noted it appeared to have increased again. She has a history of high blood pressure and takes lisinopril , although the dosage was not specified. No swelling in her legs or feet and no trouble sleeping last night.     Outpatient Medications Prior to Visit  Medication Sig Dispense Refill   ascorbic acid (VITAMIN C ) 500 MG tablet Take 500 mg by mouth daily.     Calcium  Citrate-Vitamin D  (CALCIUM  + D PO) Take 1 tablet by mouth daily.     EPINEPHrine  0.3 mg/0.3 mL IJ SOAJ injection INJECT INTO THE MIDDLE OF THE OUTER THIGH AND HOLD FOR 10 SECONDS AS NEEDED FOR SEVERE ALLERGIC REACTION THEN CALL 911 IF USED 2 each 2   Multiple Vitamins-Minerals (MULTIVITAMIN WITH MINERALS) tablet Take 1 tablet by mouth daily.     Zinc  30 MG CAPS Take 30 mg by mouth daily.     lisinopril  (ZESTRIL ) 5 MG tablet TAKE 1/2 TABLET BY MOUTH DAILY 45 tablet 2   No facility-administered medications prior to visit.    ROS Review of Systems  Constitutional:  Positive for appetite change. Negative for chills, diaphoresis, fatigue and fever.  HENT:  Negative for trouble swallowing.   Eyes:  Negative for visual disturbance.  Respiratory:  Negative for cough, chest tightness, shortness of breath and wheezing.   Cardiovascular:  Negative for chest pain, palpitations and leg swelling.  Gastrointestinal:  Positive for nausea. Negative for abdominal pain, constipation, diarrhea and vomiting.  Genitourinary:  Negative for difficulty urinating, dysuria and hematuria.  Musculoskeletal: Negative.  Negative for back pain and neck  pain.  Skin: Negative.   Neurological:  Positive for speech difficulty and weakness. Negative for dizziness, tremors, syncope, facial asymmetry, light-headedness, numbness and headaches.  Hematological:  Negative for adenopathy. Does not bruise/bleed easily.  Psychiatric/Behavioral:  Negative for behavioral problems, confusion, decreased concentration, sleep disturbance and suicidal ideas. The patient is  nervous/anxious.     Objective:  BP (!) 164/106 (BP Location: Left Arm, Patient Position: Sitting, Cuff Size: Normal) Comment: BP (R) 166/102  Pulse 73   Temp (!) 97.5 F (36.4 C) (Oral)   Resp 16   Ht 5' 3 (1.6 m)   Wt 111 lb (50.3 kg)   SpO2 99%   BMI 19.66 kg/m   BP Readings from Last 3 Encounters:  01/29/24 (!) 172/92  01/29/24 (!) 164/106  12/05/23 112/70    Wt Readings from Last 3 Encounters:  01/29/24 110 lb (49.9 kg)  01/29/24 111 lb (50.3 kg)  12/05/23 110 lb (49.9 kg)    Physical Exam Vitals reviewed.  Constitutional:      Appearance: Normal appearance.  HENT:     Nose: Nose normal.     Mouth/Throat:     Mouth: Mucous membranes are moist.  Eyes:     General: No scleral icterus.    Pupils: Pupils are equal, round, and reactive to light.  Cardiovascular:     Rate and Rhythm: Normal rate and regular rhythm.     Heart sounds: No murmur heard.    No gallop.  Pulmonary:     Effort: Pulmonary effort is normal.     Breath sounds: No stridor. No wheezing, rhonchi or rales.  Abdominal:     General: Abdomen is flat.     Palpations: There is no mass.     Tenderness: There is no abdominal tenderness. There is no guarding.     Hernia: No hernia is present.  Musculoskeletal:        General: Normal range of motion.     Cervical back: Neck supple.     Right lower leg: No edema.     Left lower leg: No edema.  Lymphadenopathy:     Cervical: No cervical adenopathy.  Skin:    General: Skin is warm and dry.  Neurological:     Mental Status: She is alert and oriented to person, place, and time.     Cranial Nerves: Cranial nerves 2-12 are intact. No cranial nerve deficit.     Sensory: Sensation is intact.     Motor: Motor function is intact. No weakness.     Coordination: Coordination is intact. Coordination normal.     Gait: Gait is intact. Gait normal.     Deep Tendon Reflexes: Reflexes normal. Babinski sign absent on the right side. Babinski sign present on the  left side.     Reflex Scores:      Tricep reflexes are 1+ on the right side.      Bicep reflexes are 1+ on the right side.      Brachioradialis reflexes are 1+ on the right side.      Patellar reflexes are 2+ on the right side and 2+ on the left side.      Achilles reflexes are 1+ on the right side.    Lab Results  Component Value Date   WBC 4.9 01/29/2024   HGB 13.6 01/29/2024   HCT 42.0 01/29/2024   PLT 237 01/29/2024   GLUCOSE 88 01/29/2024   CHOL 225 (H) 12/02/2023   TRIG 86.0 12/02/2023  HDL 88.40 12/02/2023   LDLCALC 119 (H) 12/02/2023   ALT 20 01/29/2024   AST 25 01/29/2024   NA 138 01/29/2024   K 4.3 01/29/2024   CL 102 01/29/2024   CREATININE 0.74 01/29/2024   BUN 17 01/29/2024   CO2 24 01/29/2024   TSH 0.17 (L) 12/02/2023    MM 3D SCREENING MAMMOGRAM BILATERAL BREAST Result Date: 12/29/2023 CLINICAL DATA:  Screening. EXAM: DIGITAL SCREENING BILATERAL MAMMOGRAM WITH TOMOSYNTHESIS AND CAD TECHNIQUE: Bilateral screening digital craniocaudal and mediolateral oblique mammograms were obtained. Bilateral screening digital breast tomosynthesis was performed. The images were evaluated with computer-aided detection. COMPARISON:  Previous exam(s). ACR Breast Density Category b: There are scattered areas of fibroglandular density. FINDINGS: There are no findings suspicious for malignancy. IMPRESSION: No mammographic evidence of malignancy. A result letter of this screening mammogram will be mailed directly to the patient. RECOMMENDATION: Screening mammogram in one year. (Code:SM-B-01Y) BI-RADS CATEGORY  1: Negative. Electronically Signed   By: Alm Parkins M.D.   On: 12/29/2023 11:44    MR BRAIN WO CONTRAST Result Date: 01/29/2024 CLINICAL DATA:  Transient ischemic attack (TIA). Episode of slurred speech and leg weakness yesterday. EXAM: MRI HEAD WITHOUT CONTRAST TECHNIQUE: Multiplanar, multiecho pulse sequences of the brain and surrounding structures were obtained without  intravenous contrast. COMPARISON:  CTA head and neck 01/29/2024 FINDINGS: Brain: There is no evidence of an acute infarct, intracranial hemorrhage, mass, midline shift, or extra-axial fluid collection. Cerebral Delillo matter T2 hyperintensities are nonspecific but compatible with mild chronic small vessel ischemic disease. Cerebral volume is within normal limits for age. The ventricles are normal in size. Vascular: Major intracranial vascular flow voids are preserved. Skull and upper cervical spine: Unremarkable bone marrow signal. Sinuses/Orbits: Postsurgical changes involving the maxillary sinuses. Right sphenoid sinus mucous retention cyst. Moderate mucosal thickening in the left sphenoid sinus. Small left mastoid effusion. Bilateral cataract extraction. Other: None. IMPRESSION: 1. No acute intracranial abnormality. 2. Mild chronic small vessel ischemic disease. Electronically Signed   By: Dasie Hamburg M.D.   On: 01/29/2024 14:45   CT ANGIO HEAD NECK W WO CM Result Date: 01/29/2024 CLINICAL DATA:  Transient ischemic attack (TIA). Episode of slurred speech and leg weakness yesterday. EXAM: CT ANGIOGRAPHY HEAD AND NECK WITH AND WITHOUT CONTRAST TECHNIQUE: Multidetector CT imaging of the head and neck was performed using the standard protocol during bolus administration of intravenous contrast. Multiplanar CT image reconstructions and MIPs were obtained to evaluate the vascular anatomy. Carotid stenosis measurements (when applicable) are obtained utilizing NASCET criteria, using the distal internal carotid diameter as the denominator. RADIATION DOSE REDUCTION: This exam was performed according to the departmental dose-optimization program which includes automated exposure control, adjustment of the mA and/or kV according to patient size and/or use of iterative reconstruction technique. CONTRAST:  75mL OMNIPAQUE IOHEXOL 350 MG/ML SOLN COMPARISON:  Head CT 04/29/2007 and MRI 01/29/2024 FINDINGS: CT HEAD FINDINGS  Brain: There is no evidence of an acute infarct, intracranial hemorrhage, mass, midline shift, or extra-axial fluid collection. Cerebral Wessman matter hypodensities are nonspecific but compatible with mild chronic small vessel ischemic disease. Cerebral volume is within normal limits for age. The ventricles are normal in size. Vascular: No hyperdense vessel. Skull: No fracture or suspicious marrow lesion. Sinuses/Orbits: Postsurgical changes involving the maxillary sinuses. Right sphenoid sinus mucous retention cyst. Moderate mucosal thickening in the left sphenoid sinus. Small left mastoid effusion. Bilateral cataract extraction. Other: None. Review of the MIP images confirms the above findings CTA NECK FINDINGS Aortic arch: Normal  variant aortic arch branching pattern with common origin of the brachiocephalic and left common carotid arteries. Widely patent brachiocephalic and subclavian arteries. Right carotid system: Patent with a small amount of calcified plaque at the carotid bifurcation. No evidence of a significant stenosis or dissection. Left carotid system: Patent with a small amount of calcified plaque at the carotid bifurcation. No evidence of a significant stenosis or dissection. Vertebral arteries: Patent and codominant without evidence of stenosis, dissection, or significant atherosclerosis. Skeleton: Cervical disc degeneration, particularly severe at C5-6 and C6-7. Advanced multilevel cervical facet arthrosis. Other neck: Multiple thyroid  nodules including a 2.9 cm nodule inferiorly on the right, previously evaluated by ultrasound. No evidence of cervical lymphadenopathy. Upper chest: Clear lung apices. Review of the MIP images confirms the above findings CTA HEAD FINDINGS Anterior circulation: The internal carotid arteries are widely patent from skull base to carotid termini. ACAs and MCAs are patent without evidence of a proximal branch occlusion or significant proximal stenosis. Slightly bulbous  appearance of the left A1-A2 junction appears to reflect an infundibulum rather than aneurysm. Posterior circulation: The intracranial vertebral arteries are widely patent to the basilar. Patent PICA, AICA, and SCA origins are visualized bilaterally. 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 There are moderate-sized left and diminutive or absent right posterior communicating arteries. Both PCAs are patent without evidence of a significant proximal stenosis. There is moderate irregular narrowing of distal left PCA branch vessels, left greater than right. No aneurysm is identified. Venous sinuses: As permitted by contrast timing, patent. Anatomic variants: None. Review of the MIP images confirms the above findings IMPRESSION: 1. No evidence of acute intracranial abnormality. 2. Mild chronic small vessel ischemic disease. 3. Mild atherosclerosis in the head and neck without a large vessel occlusion or significant proximal stenosis. Electronically Signed   By: Dasie Hamburg M.D.   On: 01/29/2024 14:39     Assessment & Plan:   Stroke-like symptoms- She was sent to the ED.  Hypertensive emergency- She is symptomatic.  Primary hypertension- Her BP is not at goal.     Follow-up: Return if symptoms worsen or fail to improve.  Debby Molt, MD

## 2024-01-29 NOTE — ED Notes (Signed)
 Paramedic notified of pt's BP readings.

## 2024-01-30 ENCOUNTER — Other Ambulatory Visit (HOSPITAL_COMMUNITY)

## 2024-01-30 DIAGNOSIS — R531 Weakness: Secondary | ICD-10-CM | POA: Diagnosis not present

## 2024-01-30 LAB — BASIC METABOLIC PANEL WITH GFR
Anion gap: 12 (ref 5–15)
BUN: 15 mg/dL (ref 8–23)
CO2: 24 mmol/L (ref 22–32)
Calcium: 8.8 mg/dL — ABNORMAL LOW (ref 8.9–10.3)
Chloride: 103 mmol/L (ref 98–111)
Creatinine, Ser: 0.79 mg/dL (ref 0.44–1.00)
GFR, Estimated: >60 mL/min (ref >60)
Glucose, Bld: 88 mg/dL (ref 70–99)
Potassium: 3.9 mmol/L (ref 3.5–5.1)
Sodium: 139 mmol/L (ref 135–145)

## 2024-01-30 LAB — CBC
HCT: 38.2 % (ref 36.0–46.0)
Hemoglobin: 13.1 g/dL (ref 12.0–15.0)
MCH: 33.1 pg (ref 26.0–34.0)
MCHC: 34.3 g/dL (ref 30.0–36.0)
MCV: 96.5 fL (ref 80.0–100.0)
Platelets: 245 K/uL (ref 150–400)
RBC: 3.96 MIL/uL (ref 3.87–5.11)
RDW: 12.7 % (ref 11.5–15.5)
WBC: 4.5 K/uL (ref 4.0–10.5)
nRBC: 0 % (ref 0.0–0.2)

## 2024-01-30 LAB — LIPID PANEL
Cholesterol: 215 mg/dL — ABNORMAL HIGH (ref 0–200)
HDL: 91 mg/dL (ref >40)
LDL Cholesterol: 107 mg/dL — ABNORMAL HIGH (ref 0–99)
Total CHOL/HDL Ratio: 2.4 ratio
Triglycerides: 85 mg/dL (ref <150)
VLDL: 17 mg/dL (ref 0–40)

## 2024-01-30 MED ORDER — LISINOPRIL 2.5 MG PO TABS
2.5000 mg | ORAL_TABLET | Freq: Every morning | ORAL | Status: DC
  Filled 2024-01-30: qty 1

## 2024-01-30 NOTE — Progress Notes (Signed)
 OT Cancellation Note  Patient Details Name: Margaret Craig MRN: [id number] DOB: 26-May-1945   Cancelled Treatment:    Reason Eval/Treat Not Completed: (P) OT screened, no needs identified, will sign off, per PT Pt is independent, no acute OT needs, signing off.  [name] 01/30/2024, 11:25 AM

## 2024-01-30 NOTE — Discharge Instructions (Signed)
 Margaret Craig,  You were in the hospital with an initial concern for a transient ischemic attack (TIA). Your brian imaging did not show any acute abnormalities. The neurologist does not think this was a stroke or TIA and thinks you are okay to go home. Please follow-up with your primary care physician.

## 2024-01-30 NOTE — Discharge Summary (Signed)
 Physician Discharge Summary   Patient: Margaret Craig MRN: [id number] DOB: [birth year]  Admit date:     01/29/2024  Discharge date: 01/31/24  Discharge Physician: Elgin Lam, MD   PCP: Geofm Glade PARAS, MD   Recommendations at discharge:  PCP visit for hospital follow-up  Discharge Diagnoses: Principal Problem:   Weakness Active Problems:   Hypertension  Resolved Problems:   * No resolved hospital problems. *  Hospital Course: Margaret Craig is a 78 y.o. female with a history of hypertension.  Patient presented secondary to transient weakness with initial concern for possible TIA. Workup was negative. Neurology evaluated and per neurology, unlikely TIA. No recommendations for follow-up. No changes to medications.  Assessment and Plan:  Weakness Unclear etiology. Initial concern for TIA. CT head without acute abnormality. MRI brain without acute intracranial abnormality. Neurology evaluated and per Neurology, unlikely TIA. Patient seen by PT with recommendation for no PT follow-up.  Primary hypertension Continue home lisinopril .  Consultants: Neurology Procedures performed: None  Disposition: Home Diet recommendation: Heart healthy   DISCHARGE MEDICATION: Allergies as of 01/30/2024       Reactions   Wasp Venom Anaphylaxis   Codeine Other (See Comments)   Can't think straight and insomnia         Medication List     TAKE these medications    ascorbic acid 500 MG tablet Commonly known as: VITAMIN C  Take 500 mg by mouth daily.   BIOTIN PO Take 1 capsule by mouth daily.   CITRACAL CALCIUM  +D3 PO Take 1 tablet by mouth in the morning and at bedtime.   EPINEPHrine  0.3 mg/0.3 mL Soaj injection Commonly known as: EPI-PEN INJECT INTO THE MIDDLE OF THE OUTER THIGH AND HOLD FOR 10 SECONDS AS NEEDED FOR SEVERE ALLERGIC REACTION THEN CALL 911 IF USED What changed: See the new instructions.   lisinopril  5 MG tablet Commonly known as:  ZESTRIL  TAKE A HALF TABLET BY MOUTH DAILY What changed: See the new instructions.   multivitamin with minerals tablet Take 1 tablet by mouth daily with breakfast.   TYLENOL  500 MG tablet Generic drug: acetaminophen  Take 500-1,000 mg by mouth every 8 (eight) hours as needed for mild pain (pain score 1-3) (or headaches).   Zinc  30 MG Caps Take 30 mg by mouth daily.        Follow-up Information     Burns, Glade PARAS, MD. Schedule an appointment as soon as possible for a visit.   Specialty: Internal Medicine Why: For hospital follow-up Contact information: [address] [place] [address] [phone]                Discharge Exam: BP (!) 142/92 (BP Location: Left Arm)   Pulse 73   Temp 98.3 F (36.8 C) (Oral)   Resp 16   Ht 5' 4 (1.626 m)   Wt 49.9 kg   SpO2 98%   BMI 18.88 kg/m   General exam: Appears calm and comfortable. Respiratory system: Clear to auscultation. Respiratory effort normal. Cardiovascular system: S1 & S2 heard, RRR. No murmur. Gastrointestinal system: Abdomen is nondistended, soft and nontender. Normal bowel sounds heard. Central nervous system: Alert and oriented. No focal neurological deficits. Musculoskeletal: No edema. No calf tenderness Skin: No cyanosis. No rashes Psychiatry: Judgement and insight appear normal. Mood & affect appropriate.   Condition at discharge: Stable  The results of significant diagnostics from this hospitalization (including imaging, microbiology, ancillary and laboratory) are listed below for reference.   Imaging Studies:  MR BRAIN WO CONTRAST Result Date: 01/29/2024 CLINICAL DATA:  Transient ischemic attack (TIA). Episode of slurred speech and leg weakness yesterday. EXAM: MRI HEAD WITHOUT CONTRAST TECHNIQUE: Multiplanar, multiecho pulse sequences of the brain and surrounding structures were obtained without intravenous contrast. COMPARISON:  CTA head and neck 01/29/2024 FINDINGS: Brain: There is no  evidence of an acute infarct, intracranial hemorrhage, mass, midline shift, or extra-axial fluid collection. Cerebral Yannuzzi matter T2 hyperintensities are nonspecific but compatible with mild chronic small vessel ischemic disease. Cerebral volume is within normal limits for age. The ventricles are normal in size. Vascular: Major intracranial vascular flow voids are preserved. Skull and upper cervical spine: Unremarkable bone marrow signal. Sinuses/Orbits: Postsurgical changes involving the maxillary sinuses. Right sphenoid sinus mucous retention cyst. Moderate mucosal thickening in the left sphenoid sinus. Small left mastoid effusion. Bilateral cataract extraction. Other: None. IMPRESSION: 1. No acute intracranial abnormality. 2. Mild chronic small vessel ischemic disease. Electronically Signed   By: Dasie Hamburg M.D.   On: 01/29/2024 14:45   CT ANGIO HEAD NECK W WO CM Result Date: 01/29/2024 CLINICAL DATA:  Transient ischemic attack (TIA). Episode of slurred speech and leg weakness yesterday. EXAM: CT ANGIOGRAPHY HEAD AND NECK WITH AND WITHOUT CONTRAST TECHNIQUE: Multidetector CT imaging of the head and neck was performed using the standard protocol during bolus administration of intravenous contrast. Multiplanar CT image reconstructions and MIPs were obtained to evaluate the vascular anatomy. Carotid stenosis measurements (when applicable) are obtained utilizing NASCET criteria, using the distal internal carotid diameter as the denominator. RADIATION DOSE REDUCTION: This exam was performed according to the departmental dose-optimization program which includes automated exposure control, adjustment of the mA and/or kV according to patient size and/or use of iterative reconstruction technique. CONTRAST:  75mL OMNIPAQUE IOHEXOL 350 MG/ML SOLN COMPARISON:  Head CT 04/29/2007 and MRI 01/29/2024 FINDINGS: CT HEAD FINDINGS Brain: There is no evidence of an acute infarct, intracranial hemorrhage, mass, midline shift,  or extra-axial fluid collection. Cerebral Villacis matter hypodensities are nonspecific but compatible with mild chronic small vessel ischemic disease. Cerebral volume is within normal limits for age. The ventricles are normal in size. Vascular: No hyperdense vessel. Skull: No fracture or suspicious marrow lesion. Sinuses/Orbits: Postsurgical changes involving the maxillary sinuses. Right sphenoid sinus mucous retention cyst. Moderate mucosal thickening in the left sphenoid sinus. Small left mastoid effusion. Bilateral cataract extraction. Other: None. Review of the MIP images confirms the above findings CTA NECK FINDINGS Aortic arch: Normal variant aortic arch branching pattern with common origin of the brachiocephalic and left common carotid arteries. Widely patent brachiocephalic and subclavian arteries. Right carotid system: Patent with a small amount of calcified plaque at the carotid bifurcation. No evidence of a significant stenosis or dissection. Left carotid system: Patent with a small amount of calcified plaque at the carotid bifurcation. No evidence of a significant stenosis or dissection. Vertebral arteries: Patent and codominant without evidence of stenosis, dissection, or significant atherosclerosis. Skeleton: Cervical disc degeneration, particularly severe at C5-6 and C6-7. Advanced multilevel cervical facet arthrosis. Other neck: Multiple thyroid  nodules including a 2.9 cm nodule inferiorly on the right, previously evaluated by ultrasound. No evidence of cervical lymphadenopathy. Upper chest: Clear lung apices. Review of the MIP images confirms the above findings CTA HEAD FINDINGS Anterior circulation: The internal carotid arteries are widely patent from skull base to carotid termini. ACAs and MCAs are patent without evidence of a proximal branch occlusion or significant proximal stenosis. Slightly bulbous appearance of the left A1-A2 junction appears to reflect  an infundibulum rather than aneurysm.  Posterior circulation: The intracranial vertebral arteries are widely patent to the basilar. Patent PICA, AICA, and SCA origins are visualized bilaterally. 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 There are moderate-sized left and diminutive or absent right posterior communicating arteries. Both PCAs are patent without evidence of a significant proximal stenosis. There is moderate irregular narrowing of distal left PCA branch vessels, left greater than right. No aneurysm is identified. Venous sinuses: As permitted by contrast timing, patent. Anatomic variants: None. Review of the MIP images confirms the above findings IMPRESSION: 1. No evidence of acute intracranial abnormality. 2. Mild chronic small vessel ischemic disease. 3. Mild atherosclerosis in the head and neck without a large vessel occlusion or significant proximal stenosis. Electronically Signed   By: Dasie Hamburg M.D.   On: 01/29/2024 14:39    Microbiology: Results for orders placed or performed in visit on 08/26/23  Urine Culture     Status: None   Collection Time: 08/27/23  8:30 AM   Specimen: Urine  Result Value Ref Range Status   Source: URINE  Final   Status: FINAL  Final   Result:   Final    Less than 10,000 CFU/mL of single Gram negative organism isolated. No further testing will be performed. If clinically indicated, recollection using a method to minimize contamination, with prompt transfer to Urine Culture Transport Tube, is recommended.    Labs: CBC: Recent Labs  Lab 01/29/24 1105 01/30/24 0457  WBC 4.9 4.5  NEUTROABS 3.7  --   HGB 13.6 13.1  HCT 42.0 38.2  MCV 100.5* 96.5  PLT 237 245   Basic Metabolic Panel: Recent Labs  Lab 01/29/24 1105 01/30/24 0457  NA 138 139  K 4.3 3.9  CL 102 103  CO2 24 24  GLUCOSE 88 88  BUN 17 15  CREATININE 0.74 0.79  CALCIUM  9.4 8.8*   Liver Function Tests: Recent Labs  Lab 01/29/24 1105  AST 25  ALT 20   ALKPHOS 61  BILITOT 0.5  PROT 7.3  ALBUMIN 4.6   CBG: Recent Labs  Lab 01/29/24 1100  GLUCAP 79    Discharge time spent: 35 minutes.  Signed: Elgin Lam, MD [facility] 01/30/2024

## 2024-01-30 NOTE — Consult Note (Signed)
 NEUROLOGY CONSULT NOTE   Date of service: January 30, 2024 Patient Name: Margaret Craig MRN:  [id number] DOB:  1945-09-28 Chief Complaint: focal neuro episode concerning for TIA  Requesting Provider: Zella Katha HERO, MD  History of Present Illness  [name] is a 78 y.o. female with hx of arthritis, hypertension, hypothyroidism who presents with an episode with difficulty walking, possibly slurred speech the next day.  Patient reports that on 01/28/2024, in the evening she was having dinner with her friends and had 2 glasses of wine.  She spilled half a glass of wine and so took her shoes off as she did not want her new shoes to get dirty.  She is unable to describe but tells me that she had difficulty putting her shoes on and her friends had to help her to the car, as she felt weak all over and felt both her legs felt weak.  She went to bed at the friend's house and got up early in the morning and friend made her some coffee.  The friend felt that patient's speech was slurred.  However, patient did not feel that her speech sounded any different to her.  At the insistence of friend, she called her PCP office and was seen the same day.  There is mention of changes in her facial expression in her PCPs note.  However, patient denies this when I asked her.  She did not notice any facial droop.  She denies any prior history of strokes, no family history of strokes.  She has a history of hypertension but denies any history of diabetes or hyperlipidemia.  She reports being very active and walks a lot.  LKW: 1800 on 01/28/2024 Modified rankin score: 0-Completely asymptomatic and back to baseline post- stroke IV Thrombolysis: Not offered, low suspicion for stroke and no symptoms.   EVT: Not offered, low suspicion for stroke and no symptoms.   NIHSS components Score: Comment  1a Level of Conscious 0[]  1[]  2[]  3[]      1b LOC Questions 0[]  1[]  2[]       1c LOC Commands 0[]   1[]  2[]       2 Best Gaze 0[]  1[]  2[]       3 Visual 0[]  1[]  2[]  3[]      4 Facial Palsy 0[]  1[]  2[]  3[]      5a Motor Arm - left 0[]  1[]  2[]  3[]  4[]  UN[]    5b Motor Arm - Right 0[]  1[]  2[]  3[]  4[]  UN[]    6a Motor Leg - Left 0[]  1[]  2[]  3[]  4[]  UN[]    6b Motor Leg - Right 0[]  1[]  2[]  3[]  4[]  UN[]    7 Limb Ataxia 0[]  1[]  2[]  UN[]      8 Sensory 0[]  1[]  2[]  UN[]      9 Best Language 0[]  1[]  2[]  3[]      10 Dysarthria 0[]  1[]  2[]  UN[]      11 Extinct. and Inattention 0[]  1[]  2[]       TOTAL: 0      ROS  Comprehensive ROS performed and pertinent positives documented in HPI   Past History   Past Medical History:  Diagnosis Date   Allergy    Arthritis    Colon polyps    Fainting spell    Hypertension    PONV (postoperative nausea and vomiting)    Thyroid  disease    UTI (urinary tract infection)     Past Surgical History:  Procedure Laterality Date   ANTERIOR LAT LUMBAR FUSION Left 06/04/2023  Procedure: LEFT-SIDED LATERAL INTERBODY FUSION LUMBAR FOUR - LUMBAR FIVE WITH INSTRUMENTATION AND ALLOGRAFT;  Surgeon: Beuford Anes, MD;  Location: [facility] OR;  Service: Orthopedics;  Laterality: Left;   COLONOSCOPY     POSTERIOR FUSION PEDICLE SCREW PLACEMENT N/A 06/04/2023   Procedure: POSTERIOR SPINAL FUSION LUMBAR FOUR - LUMBAR FIVE WITH INSTRUMENTATION AND ALLOGRAFT;  Surgeon: Beuford Anes, MD;  Location: [facility] OR;  Service: Orthopedics;  Laterality: N/A;   SINUS IRRIGATION     TONSILLECTOMY      Family History: Family History  Problem Relation Age of Onset   Mental illness Mother    Colon cancer Mother 60   Cancer Maternal Grandmother        breast   Heart disease Maternal Grandfather    Alcohol abuse Son    Mental illness Son    Heart disease Maternal Aunt    Colon cancer Maternal Aunt        diagnosed in her 9s   Breast cancer Maternal Aunt    Heart disease Paternal Aunt    Alcohol abuse Paternal Uncle    Esophageal cancer Neg Hx    Stomach cancer Neg Hx    Rectal cancer Neg Hx      Social History  reports that she has never smoked. She has never used smokeless tobacco. She reports current alcohol use of about 2.0 standard drinks of alcohol per week. She reports that she does not use drugs.  Allergies  Allergen Reactions   Wasp Venom Anaphylaxis   Codeine Other (See Comments)    Can't think straight and insomnia     Medications   Current Facility-Administered Medications:     stroke: early stages of recovery book, , Does not apply, Once, Zella, Mir M, MD   acetaminophen  (TYLENOL ) tablet 650 mg, 650 mg, Oral, Q6H PRN **OR** acetaminophen  (TYLENOL ) suppository 650 mg, 650 mg, Rectal, Q6H PRN, Zella, Mir M, MD   albuterol (PROVENTIL) (2.5 MG/3ML) 0.083% nebulizer solution 2.5 mg, 2.5 mg, Nebulization, Q2H PRN, Zella, Mir M, MD   enoxaparin (LOVENOX) injection 40 mg, 40 mg, Subcutaneous, Q24H, Zella, Mir M, MD   ondansetron  (ZOFRAN ) tablet 4 mg, 4 mg, Oral, Q6H PRN **OR** ondansetron  (ZOFRAN ) injection 4 mg, 4 mg, Intravenous, Q6H PRN, Zella, Mir M, MD   traZODone (DESYREL) tablet 25 mg, 25 mg, Oral, QHS PRN, Zella, Mir M, MD, 25 mg at 01/29/24 2050  Vitals   Vitals:   01/29/24 1631 01/29/24 1816 01/29/24 1911 01/30/24 0021  BP:  (!) 154/88 (!) 158/85 131/82  Pulse:  72 73 62  Resp:  18 18 20   Temp:  97.9 F (36.6 C) 98.1 F (36.7 C) 99.2 F (37.3 C)  TempSrc:  Oral  Oral  SpO2: 99% 98% 100% 100%  Weight:      Height:        Body mass index is 18.88 kg/m.   Physical Exam   General: Laying comfortably in bed; in no acute distress.  HENT: Normal oropharynx and mucosa. Normal external appearance of ears and nose.  Neck: Supple, no pain or tenderness  CV: No JVD. No peripheral edema.  Pulmonary: Symmetric Chest rise. Normal respiratory effort.  Abdomen: Soft to touch, non-tender.  Ext: No cyanosis, edema, or deformity  Skin: No rash. Normal palpation of skin.   Musculoskeletal: Normal digits and nails by  inspection. No clubbing.   Neurologic Examination  Mental status/Cognition: Alert, oriented to self, place, month and year, good attention.  Speech/language: Fluent, comprehension intact, object  naming intact, repetition intact.  Cranial nerves:   CN II Pupils equal and reactive to light, no VF deficits    CN III,IV,VI EOM intact, no gaze preference or deviation, no nystagmus    CN V normal sensation in V1, V2, and V3 segments bilaterally    CN VII no asymmetry, no nasolabial fold flattening    CN VIII normal hearing to speech    CN IX & X normal palatal elevation, no uvular deviation    CN XI 5/5 head turn and 5/5 shoulder shrug bilaterally    CN XII midline tongue protrusion    Motor:  Muscle bulk: Normal, tone normal, pronator drift none tremor none Mvmt Root Nerve  Muscle Right Left Comments  SA C5/6 Ax Deltoid 5 5   EF C5/6 Mc Biceps 5 5   EE C6/7/8 Rad Triceps 5 5   WF C6/7 Med FCR     WE C7/8 PIN ECU     F Ab C8/T1 U ADM/FDI 5 5   HF L1/2/3 Fem Illopsoas 5 5   KE L2/3/4 Fem Quad 5 5   DF L4/5 D Peron Tib Ant 5 5   PF S1/2 Tibial Grc/Sol 5 5    Sensation:  Light touch Intact throughout   Pin prick    Temperature    Vibration   Proprioception    Coordination/Complex Motor:  - Finger to Nose intact bilaterally - Heel to shin intact bilaterally - Rapid alternating movement are normal deferred - Gait: Deferred gait.  Labs/Imaging/Neurodiagnostic studies   CBC:  Recent Labs  Lab 2024/02/11 1105  WBC 4.9  NEUTROABS 3.7  HGB 13.6  HCT 42.0  MCV 100.5*  PLT 237   Basic Metabolic Panel:  Lab Results  Component Value Date   NA 138 11-Feb-2024   K 4.3 02/11/2024   CO2 24 02/11/24   GLUCOSE 88 February 11, 2024   BUN 17 02-11-2024   CREATININE 0.74 02-11-2024   CALCIUM  9.4 02-11-24   GFRNONAA >60 11-Feb-2024   GFRAA  04/29/2007    >60        The eGFR has been calculated using the MDRD equation. This calculation has not been validated in all  clinical situations. eGFR's persistently <60 mL/min signify possible Chronic Kidney Disease.   Lipid Panel:  Lab Results  Component Value Date   LDLCALC 119 (H) 12/02/2023   HgbA1c:  Lab Results  Component Value Date   HGBA1C 5.0 2024-02-11   Urine Drug Screen: No results found for: LABOPIA, COCAINSCRNUR, LABBENZ, AMPHETMU, THCU, LABBARB  Alcohol Level No results found for: ETH INR No results found for: INR APTT No results found for: APTT AED levels: No results found for: PHENYTOIN, ZONISAMIDE, LAMOTRIGINE, LEVETIRACETA  CT Head without contrast(Personally reviewed): CTH was negative for a large hypodensity concerning for a large territory infarct or hyperdensity concerning for an ICH  CT angio Head and Neck with contrast(Personally reviewed): No LVO.  MRI Brain(Personally reviewed): No acute abnormalities.  ASSESSMENT   Sheccid Lahmann is a 78 y.o. female with hx of arthritis, hypertension, hypothyroidism who presents with an episode with difficulty walking, possibly slurred speech the next day.  The description of the event provided by the patient does not seem to be concerning for a TIA.  It would be very unusual for TIA to cause bilateral lower extremity when symptoms.  TIA usually causes unilateral symptoms.  Also the slurred speech was noted the morning after her last several hour after she experienced transient bilateral lower  extremity weakness.  Upon discussion with her, it seems that the weakness was more generalized rather than just limited to bilateral lower extremities.  Workup so far with CT head with no acute abnormalities.  CTA of the head and neck with no significant stenosis or LVO.  MRI of the brain is also negative for any acute infarct or any prior remote infarcts.  Patient remembers the entire event and was suspicion for this being a seizure is low. It would also be thought to have a first-time seizure at her age in the  absence of clear risk factors and a normal MRI.  RECOMMENDATIONS  - no further inpatient neurologic workup. Low suspicion that this was a TIA. Okay to discharge from a neuro standpoint. ______________________________________________________________________    Signed, Ellouise Mari, MD [facility]

## 2024-01-30 NOTE — Plan of Care (Signed)

## 2024-01-30 NOTE — Evaluation (Signed)
 Physical Therapy Evaluation & Discharge Patient Details Name: Margaret Craig MRN: [id number] DOB: August 13, 1945 Today's Date: 01/30/2024  History of Present Illness  Pt is a 78 y.o. female who presented 01/29/24 with an episode of difficulty walking and slurred speech. MRI brain without acute intracranial abnormality. PMHx includes arthritis, HTN, PONV, thyroid  disease, fainting spell, and colon polyps.   Clinical Impression  Pt presents with condition above. Pt appears to be and reports to be back to her baseline, displaying symmetrical, intact, and WFL bil upper and lower extremity strength, sensation, and coordination. She is able to perform all functional mobility safely and independently without DME. All education has been completed and questions answered. PT will sign off. Thank you for this referral.        If plan is discharge home, recommend the following:  (N/A)   Can travel by private vehicle        Equipment Recommendations None recommended by PT  Recommendations for Other Services       Functional Status Assessment Patient has not had a recent decline in their functional status     Precautions / Restrictions Precautions Precautions: None Restrictions Weight Bearing Restrictions Per Provider Order: No      Mobility  Bed Mobility Overal bed mobility: Independent             General bed mobility comments: No assistance needed to exit R EOB    Transfers Overall transfer level: Independent Equipment used: None               General transfer comment: No LOB standing from EOB 2x    Ambulation/Gait Ambulation/Gait assistance: Independent Gait Distance (Feet): 170 Feet Assistive device: None Gait Pattern/deviations: WFL(Within Functional Limits) Gait velocity: WFL     General Gait Details: No drastic gait deviations noted and no LOB, even with sudden changes in speed, directions, and head positions. Pt able to weave in and out of obstacles  and step over obstacles without LOB  Stairs            Wheelchair Mobility     Tilt Bed    Modified Rankin (Stroke Patients Only) Modified Rankin (Stroke Patients Only) Pre-Morbid Rankin Score: No symptoms Modified Rankin: No symptoms     Balance Overall balance assessment: No apparent balance deficits (not formally assessed)                                           Pertinent Vitals/Pain Pain Assessment Pain Assessment: Faces Faces Pain Scale: No hurt Pain Intervention(s): Monitored during session    Home Living Family/patient expects to be discharged to:: Private residence Living Arrangements: Alone Available Help at Discharge: Family;Available 24 hours/day Type of Home: House Home Access: Stairs to enter Entrance Stairs-Rails: None Entrance Stairs-Number of Steps: 2 Alternate Level Stairs-Number of Steps: 15 Home Layout: Two level;Bed/bath upstairs Home Equipment: Agricultural consultant (2 wheels)      Prior Function Prior Level of Function : Independent/Modified Independent;Driving             Mobility Comments: No AD       Extremity/Trunk Assessment   Upper Extremity Assessment Upper Extremity Assessment: Overall WFL for tasks assessed;Right hand dominant (MMT scores symmetrically grossly 4+ to 5 bil; sensation and coordination intact bil)    Lower Extremity Assessment Lower Extremity Assessment: Overall WFL for tasks assessed (MMT scores symmetrically grossly 4+ to  5 bil; sensation and coordination intact bil)    Cervical / Trunk Assessment Cervical / Trunk Assessment: Normal  Communication   Communication Communication: No apparent difficulties    Cognition Arousal: Alert Behavior During Therapy: WFL for tasks assessed/performed   PT - Cognitive impairments: No apparent impairments                         Following commands: Intact       Cueing Cueing Techniques: Verbal cues     General Comments General  comments (skin integrity, edema, etc.): Educated pt and daughter on BE FAST    Exercises     Assessment/Plan    PT Assessment Patient does not need any further PT services  PT Problem List         PT Treatment Interventions      PT Goals (Current goals can be found in the Care Plan section)  Acute Rehab PT Goals Patient Stated Goal: to go home PT Goal Formulation: All assessment and education complete, DC therapy Time For Goal Achievement: 01/31/24 Potential to Achieve Goals: Good    Frequency       Co-evaluation               AM-PAC PT 6 Clicks Mobility  Outcome Measure Help needed turning from your back to your side while in a flat bed without using bedrails?: None Help needed moving from lying on your back to sitting on the side of a flat bed without using bedrails?: None Help needed moving to and from a bed to a chair (including a wheelchair)?: None Help needed standing up from a chair using your arms (e.g., wheelchair or bedside chair)?: None Help needed to walk in hospital room?: None Help needed climbing 3-5 steps with a railing? : None 6 Click Score: 24    End of Session   Activity Tolerance: Patient tolerated treatment well Patient left: in bed;with call bell/phone within reach;with family/visitor present   PT Visit Diagnosis: Difficulty in walking, not elsewhere classified (R26.2)    Time: 8958-8946 PT Time Calculation (min) (ACUTE ONLY): 12 min   Charges:   PT Evaluation $PT Eval Low Complexity: 1 Low   PT General Charges $$ ACUTE PT VISIT: 1 Visit         Theo Ferretti, PT, DPT Acute Rehabilitation Services  Office: [phone]   Theo CHRISTELLA Ferretti 01/30/2024, 11:01 AM

## 2024-01-30 NOTE — Progress Notes (Signed)
 Transition of Care [facility]) - Inpatient Brief Assessment   Patient Details  Name: Margaret Craig MRN: [id number] Date of Birth: 03-22-1946  [facility] CM/SW Contact:    Rosaline JONELLE Joe, RN Phone Number: 01/30/2024, 10:35 AM   Clinical Narrative: Patient admitted from home with TIA.  No IP Care management needs at this time.  Patient is scheduled to discharge to home today.   Transition of Care Asessment: Insurance and Status: (P) Insurance coverage has been reviewed Patient has primary care physician: (P) Yes Home environment has been reviewed: (P) from home Prior level of function:: (P) self Prior/Current Home Services: (P) No current home services Social Drivers of Health Review: (P) SDOH reviewed no interventions necessary Readmission risk has been reviewed: (P) Yes Transition of care needs: (P) no transition of care needs at this time

## 2024-01-31 NOTE — Hospital Course (Signed)
 Margaret Craig is a 78 y.o. female with a history of hypertension.  Patient presented secondary to transient weakness with initial concern for possible TIA. Workup was negative. Neurology evaluated and per neurology, unlikely TIA. No recommendations for follow-up. No changes to medications.

## 2024-02-01 LAB — T3, FREE: T3, Free: 2.6 pg/mL (ref 2.0–4.4)

## 2024-02-02 ENCOUNTER — Ambulatory Visit: Payer: Self-pay

## 2024-02-02 ENCOUNTER — Telehealth: Payer: Self-pay

## 2024-02-02 ENCOUNTER — Encounter: Payer: Self-pay | Admitting: Internal Medicine

## 2024-02-02 NOTE — Transitions of Care (Post Inpatient/ED Visit) (Signed)
 02/02/2024  Name: Margaret Craig MRN: [id number] DOB: [birth year]  Today's TOC FU Call Status: Today's TOC FU Call Status:: Successful TOC FU Call Completed TOC FU Call Complete Date: 02/02/24 Patient's Name and Date of Birth confirmed.  Transition Care Management Follow-up Telephone Call Date of Discharge: 01/30/24 Discharge Facility: [facility]) Type of Discharge: Inpatient Admission Primary Inpatient Discharge Diagnosis:: TIA How have you been since you were released from the hospital?: Better Any questions or concerns?: No  Items Reviewed: Did you receive and understand the discharge instructions provided?: Yes Medications obtained,verified, and reconciled?: Yes (Medications Reviewed) Any new allergies since your discharge?: No Dietary orders reviewed?: Yes Do you have support at home?: Yes People in Home [RPT]: friend(s)  Medications Reviewed Today: Medications Reviewed Today     Reviewed by Emmitt Pan, LPN (Licensed Practical Nurse) on 02/02/24 at 1441  Med List Status: <None>   Medication Order Taking? Sig Documenting Provider Last Dose Status Informant  ascorbic acid (VITAMIN C ) 500 MG tablet [id number] Yes Take 500 mg by mouth daily. [provider]  Active Self  BIOTIN PO [id number] Yes Take 1 capsule by mouth daily. [provider]  Active Self           Med Note MARISA, NATHANEL LOISE Schaumann Jan 29, 2024  3:26 PM) Strength was not recalled by the patient  Calcium -Magnesium-Vitamin D  (CITRACAL CALCIUM  +D3 PO) [id number] Yes Take 1 tablet by mouth in the morning and at bedtime. [provider]  Active Self  EPINEPHrine  0.3 mg/0.3 mL IJ SOAJ injection [id number] Yes INJECT INTO THE MIDDLE OF THE OUTER THIGH AND HOLD FOR 10 SECONDS AS NEEDED FOR SEVERE ALLERGIC REACTION THEN CALL 911 IF USED  Patient taking differently: Inject 0.3 mg into the muscle See admin instructions. INJECT 0.3 MG INTO THE MIDDLE OF THE OUTER THIGH AND HOLD FOR 10  SECONDS AS NEEDED FOR A SEVERE ALLERGIC REACTION. THEN, CALL 911 IF USED.   Geofm Glade PARAS, MD  Active Self  lisinopril  (ZESTRIL ) 5 MG tablet [id number] Yes TAKE A HALF TABLET BY MOUTH DAILY  Patient taking differently: Take 2.5 mg by mouth in the morning.   Geofm Glade PARAS, MD  Active Self  Multiple Vitamins-Minerals (MULTIVITAMIN WITH MINERALS) tablet [id number] Yes Take 1 tablet by mouth daily with breakfast. [provider]  Active Self  TYLENOL  500 MG tablet [id number] Yes Take 500-1,000 mg by mouth every 8 (eight) hours as needed for mild pain (pain score 1-3) (or headaches). [provider]  Active Self  Zinc  30 MG CAPS [id number] Yes Take 30 mg by mouth daily. [provider]  Active Self            Home Care and Equipment/Supplies: Were Home Health Services Ordered?: NA Any new equipment or medical supplies ordered?: NA  Functional Questionnaire: Do you need assistance with bathing/showering or dressing?: No Do you need assistance with meal preparation?: No Do you need assistance with eating?: No Do you have difficulty maintaining continence: No Do you need assistance with getting out of bed/getting out of a chair/moving?: No Do you have difficulty managing or taking your medications?: No  Follow up appointments reviewed: PCP Follow-up appointment confirmed?: Yes Date of PCP follow-up appointment?: 02/03/24 Follow-up Provider: [facility] Follow-up appointment confirmed?: NA Do you need transportation to your follow-up appointment?: No Do you understand care options if your condition(s) worsen?: Yes-patient verbalized understanding   Pan Emmitt, LPN [facility] Nurse Health Advisor Direct Dial  [phone]  SIGNATURE Julian Lemmings, LPN [facility] Nurse Health Advisor Direct Dial [phone]

## 2024-02-02 NOTE — Telephone Encounter (Signed)
 Pt already has appt tomorrow but would like to know if she can take an additional 1/2 tab of lisinoptil if BP is elevated in the afternoon. Patient denies higher acuity questions. ED precautions reviewed, pt verbalized understanding.  *Requests response via phone (not MyChart)   FYI Only or Action Required?: Action required by provider: clinical question for provider.  Patient was last seen in primary care on 01/29/2024 by Joshua Debby CROME, MD.  Called Nurse Triage reporting Hypertension.  Symptoms began several days ago.  Interventions attempted: Nothing.  Symptoms are: gradually worsening.  Triage Disposition: Discuss With PCP and Callback by Nurse Today (overriding See PCP Within 2 Weeks)  Patient/caregiver understands and will follow disposition?: Yes Reason for Disposition  [1] Systolic BP >= 130 OR Diastolic >= 80 AND [2] taking BP medications  Answer Assessment - Initial Assessment Questions Reports BP raises from her normal around 111/80 to 150s systolic, fluctuating since ED visit on 01/28/24. Pt reports that she historically has taken an additional 1/2 tab lisinopril  in the afternoon and it resolves. Denies neurological symptoms.   ED precautions reviewed, pt verbalized understanding.   1. BLOOD PRESSURE: What is your blood pressure? Did you take at least two measurements 5 minutes apart?     Fluctuating  2. ONSET: When did you take your blood pressure?     01/28/24 since being in the ED  3. HOW: How did you take your blood pressure? (e.g., automatic home BP monitor, visiting nurse)     Monitor  4. HISTORY: Do you have a history of high blood pressure?     Yes  5. MEDICINES: Are you taking any medicines for blood pressure? Have you missed any doses recently?     Lisinopril   Protocols used: Blood Pressure - High-A-AH Copied from CRM [id number]. Topic: Clinical - Medication Question >> Feb 02, 2024  8:04 AM Suzen RAMAN wrote: Reason for CRM:  Patient  would like to know if its okay to take 1/2 a lisinopril  when her BP becomes elevated.   RA#[phone]

## 2024-02-02 NOTE — Progress Notes (Unsigned)
 Subjective:    Patient ID: Margaret Craig, female    DOB: 08-03-1945, 78 y.o.   MRN: [id number]     HPI Margaret Craig is here for follow up from the hospital.  Admitted for observation in Ed 10/9 - 10/10  Presented with weakness.  Per ED records she was out to dinner with a couple of friends when she had sudden onset of generalized weakness particularly in bilateral lower extremities.  When she went to leave the restaurant she had difficulty with coordination and weakness and her friend had to help her out of the restaurant.  She slept at one of her friends house and next morning a friend thought she may have had some facial droop, word finding difficulty and dysarthria.  She did not noticed that.   Initial concern for TIA.  Workup was negative.  CT angio head and neck without evidence of acute intracranial abnormality.  Small chronic vessel ischemic disease.  Mild atherosclerosis in head and neck without large vessel occlusion or significant proximal stenosis.  MRI head without acute intercranial abnormality.  Mild chronic small vessel ischemic disease.  Neurology consulted-thought it was unlikely TIA since it affected both legs.  No changes in medication were made.  She states she had sudden weakness and she was not able to coordinate putting her shoes on.?  Some confusion-she was not quite sure what was going we did not seem to remember all of the details of that night.  She typically has not been checking her blood pressure regularly but since this occurred she has been it has been elevated on several occasions.  She is taking her medication as prescribed.    BP 159/56 66, 111/87, 159/92 103, 133/85   71, 147/93 this morning    Medications and allergies reviewed with patient and updated if appropriate.  Current Outpatient Medications on File Prior to Visit  Medication Sig Dispense Refill   ascorbic acid (VITAMIN C ) 500 MG tablet Take 500 mg by mouth daily.     BIOTIN  PO Take 1 capsule by mouth daily.     Calcium -Magnesium-Vitamin D  (CITRACAL CALCIUM  +D3 PO) Take 1 tablet by mouth in the morning and at bedtime.     EPINEPHrine  0.3 mg/0.3 mL IJ SOAJ injection INJECT INTO THE MIDDLE OF THE OUTER THIGH AND HOLD FOR 10 SECONDS AS NEEDED FOR SEVERE ALLERGIC REACTION THEN CALL 911 IF USED (Patient taking differently: Inject 0.3 mg into the muscle See admin instructions. INJECT 0.3 MG INTO THE MIDDLE OF THE OUTER THIGH AND HOLD FOR 10 SECONDS AS NEEDED FOR A SEVERE ALLERGIC REACTION. THEN, CALL 911 IF USED.) 2 each 2   lisinopril  (ZESTRIL ) 5 MG tablet TAKE A HALF TABLET BY MOUTH DAILY (Patient taking differently: Take 2.5 mg by mouth in the morning.) 45 tablet 2   Multiple Vitamins-Minerals (MULTIVITAMIN WITH MINERALS) tablet Take 1 tablet by mouth daily with breakfast.     TYLENOL  500 MG tablet Take 500-1,000 mg by mouth every 8 (eight) hours as needed for mild pain (pain score 1-3) (or headaches).     Zinc  30 MG CAPS Take 30 mg by mouth daily.     No current facility-administered medications on file prior to visit.     Review of Systems  Constitutional:  Negative for fatigue.  Respiratory:  Negative for shortness of breath.   Cardiovascular:  Positive for palpitations (x last several day). Negative for chest pain and leg swelling.  Neurological:  Negative for dizziness,  light-headedness and headaches.  Psychiatric/Behavioral:  Positive for sleep disturbance.        Objective:   Vitals:   02/03/24 1133  BP: 116/78  Pulse: 69  Temp: 98 F (36.7 C)  SpO2: 97%   BP Readings from Last 3 Encounters:  02/03/24 116/78  01/30/24 (!) 142/92  01/29/24 (!) 164/106   Wt Readings from Last 3 Encounters:  02/03/24 108 lb (49 kg)  01/29/24 110 lb (49.9 kg)  01/29/24 111 lb (50.3 kg)   Body mass index is 18.54 kg/m.    Physical Exam Constitutional:      General: She is not in acute distress.    Appearance: Normal appearance. She is not ill-appearing.   HENT:     Head: Normocephalic and atraumatic.  Neck:     Vascular: No carotid bruit.  Cardiovascular:     Rate and Rhythm: Normal rate and regular rhythm.     Heart sounds: No murmur heard. Pulmonary:     Effort: Pulmonary effort is normal.     Breath sounds: Normal breath sounds.  Musculoskeletal:     Cervical back: Neck supple. No tenderness.     Right lower leg: No edema.     Left lower leg: No edema.  Lymphadenopathy:     Cervical: No cervical adenopathy.  Skin:    General: Skin is warm and dry.  Neurological:     General: No focal deficit present.     Mental Status: She is alert. Mental status is at baseline.     Cranial Nerves: No cranial nerve deficit.     Gait: Gait normal.  Psychiatric:        Mood and Affect: Mood normal.        Lab Results  Component Value Date   WBC 4.5 01/30/2024   HGB 13.1 01/30/2024   HCT 38.2 01/30/2024   PLT 245 01/30/2024   GLUCOSE 88 01/30/2024   CHOL 215 (H) 01/30/2024   TRIG 85 01/30/2024   HDL 91 01/30/2024   LDLCALC 107 (H) 01/30/2024   ALT 20 01/29/2024   AST 25 01/29/2024   NA 139 01/30/2024   K 3.9 01/30/2024   CL 103 01/30/2024   CREATININE 0.79 01/30/2024   BUN 15 01/30/2024   CO2 24 01/30/2024   TSH 0.224 (L) 01/29/2024   HGBA1C 5.0 01/29/2024   MR BRAIN WO CONTRAST CLINICAL DATA:  Transient ischemic attack (TIA). Episode of slurred speech and leg weakness yesterday.  EXAM: MRI HEAD WITHOUT CONTRAST  TECHNIQUE: Multiplanar, multiecho pulse sequences of the brain and surrounding structures were obtained without intravenous contrast.  COMPARISON:  CTA head and neck 01/29/2024  FINDINGS: Brain: There is no evidence of an acute infarct, intracranial hemorrhage, mass, midline shift, or extra-axial fluid collection. Cerebral Loveall matter T2 hyperintensities are nonspecific but compatible with mild chronic small vessel ischemic disease. Cerebral volume is within normal limits for age. The ventricles are  normal in size.  Vascular: Major intracranial vascular flow voids are preserved.  Skull and upper cervical spine: Unremarkable bone marrow signal.  Sinuses/Orbits: Postsurgical changes involving the maxillary sinuses. Right sphenoid sinus mucous retention cyst. Moderate mucosal thickening in the left sphenoid sinus. Small left mastoid effusion. Bilateral cataract extraction.  Other: None.  IMPRESSION: 1. No acute intracranial abnormality. 2. Mild chronic small vessel ischemic disease.  Electronically Signed   By: Dasie Hamburg M.D.   On: 01/29/2024 14:45 CT ANGIO HEAD NECK W WO CM CLINICAL DATA:  Transient ischemic attack (TIA). Episode  of slurred speech and leg weakness yesterday.  EXAM: CT ANGIOGRAPHY HEAD AND NECK WITH AND WITHOUT CONTRAST  TECHNIQUE: Multidetector CT imaging of the head and neck was performed using the standard protocol during bolus administration of intravenous contrast. Multiplanar CT image reconstructions and MIPs were obtained to evaluate the vascular anatomy. Carotid stenosis measurements (when applicable) are obtained utilizing NASCET criteria, using the distal internal carotid diameter as the denominator.  RADIATION DOSE REDUCTION: This exam was performed according to the departmental dose-optimization program which includes automated exposure control, adjustment of the mA and/or kV according to patient size and/or use of iterative reconstruction technique.  CONTRAST:  75mL OMNIPAQUE IOHEXOL 350 MG/ML SOLN  COMPARISON:  Head CT 04/29/2007 and MRI 01/29/2024  FINDINGS: CT HEAD FINDINGS  Brain: There is no evidence of an acute infarct, intracranial hemorrhage, mass, midline shift, or extra-axial fluid collection. Cerebral Faye matter hypodensities are nonspecific but compatible with mild chronic small vessel ischemic disease. Cerebral volume is within normal limits for age. The ventricles are normal in size.  Vascular: No hyperdense  vessel.  Skull: No fracture or suspicious marrow lesion.  Sinuses/Orbits: Postsurgical changes involving the maxillary sinuses. Right sphenoid sinus mucous retention cyst. Moderate mucosal thickening in the left sphenoid sinus. Small left mastoid effusion. Bilateral cataract extraction.  Other: None.  Review of the MIP images confirms the above findings  CTA NECK FINDINGS  Aortic arch: Normal variant aortic arch branching pattern with common origin of the brachiocephalic and left common carotid arteries. Widely patent brachiocephalic and subclavian arteries.  Right carotid system: Patent with a small amount of calcified plaque at the carotid bifurcation. No evidence of a significant stenosis or dissection.  Left carotid system: Patent with a small amount of calcified plaque at the carotid bifurcation. No evidence of a significant stenosis or dissection.  Vertebral arteries: Patent and codominant without evidence of stenosis, dissection, or significant atherosclerosis.  Skeleton: Cervical disc degeneration, particularly severe at C5-6 and C6-7. Advanced multilevel cervical facet arthrosis.  Other neck: Multiple thyroid  nodules including a 2.9 cm nodule inferiorly on the right, previously evaluated by ultrasound. No evidence of cervical lymphadenopathy.  Upper chest: Clear lung apices.  Review of the MIP images confirms the above findings  CTA HEAD FINDINGS  Anterior circulation: The internal carotid arteries are widely patent from skull base to carotid termini. ACAs and MCAs are patent without evidence of a proximal branch occlusion or significant proximal stenosis. Slightly bulbous appearance of the left A1-A2 junction appears to reflect an infundibulum rather than aneurysm.  Posterior circulation: The intracranial vertebral arteries are widely patent to the basilar. Patent PICA, AICA, and SCA origins are visualized bilaterally. The basilar artery is widely patent  with an incidental fenestration near the vertebrobasilar junction. There are moderate-sized left and diminutive or absent right posterior communicating arteries. Both PCAs are patent without evidence of a significant proximal stenosis. There is moderate irregular narrowing of distal left PCA branch vessels, left greater than right. No aneurysm is identified.  Venous sinuses: As permitted by contrast timing, patent.  Anatomic variants: None.  Review of the MIP images confirms the above findings  IMPRESSION: 1. No evidence of acute intracranial abnormality. 2. Mild chronic small vessel ischemic disease. 3. Mild atherosclerosis in the head and neck without a large vessel occlusion or significant proximal stenosis.  Electronically Signed   By: Dasie Hamburg M.D.   On: 01/29/2024 14:39    Assessment & Plan:    See Problem List for Assessment and Plan  of chronic medical problems.

## 2024-02-03 ENCOUNTER — Ambulatory Visit: Admitting: Internal Medicine

## 2024-02-03 ENCOUNTER — Encounter: Payer: Self-pay | Admitting: Neurology

## 2024-02-03 ENCOUNTER — Ambulatory Visit: Attending: Internal Medicine

## 2024-02-03 VITALS — BP 116/78 | HR 69 | Temp 98.0°F | Ht 64.0 in | Wt 108.0 lb

## 2024-02-03 DIAGNOSIS — M810 Age-related osteoporosis without current pathological fracture: Secondary | ICD-10-CM

## 2024-02-03 DIAGNOSIS — R002 Palpitations: Secondary | ICD-10-CM

## 2024-02-03 DIAGNOSIS — I10 Essential (primary) hypertension: Secondary | ICD-10-CM

## 2024-02-03 DIAGNOSIS — R531 Weakness: Secondary | ICD-10-CM

## 2024-02-03 MED ORDER — LISINOPRIL 5 MG PO TABS: 2.5000 mg | ORAL_TABLET | Freq: Two times a day (BID) | ORAL | Status: DC

## 2024-02-03 NOTE — Assessment & Plan Note (Signed)
 Chronic Not ideally controlled recently-has been variable and often elevated Increase lisinopril  to 2.5 mg twice daily-hold if BP is low Would like BP to be daily consistently less than 140/90 and ideally 110-130/60/80 Monitor BP at home

## 2024-02-03 NOTE — Assessment & Plan Note (Signed)
 Acute episode of generalized weakness-particularly both legs and decreased coordination, possible confusion  Next morning her friend did feel like she was slurring her words a little bit and had a little facial droop, but she did not see that Went to the emergency room workup negative for stroke, neurology did not feel that a TIA was likely given involvement in both legs ?  Cause-Will refer to neurology for their opinion

## 2024-02-03 NOTE — Progress Notes (Unsigned)
 EP to read.

## 2024-02-03 NOTE — Assessment & Plan Note (Signed)
 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

## 2024-02-03 NOTE — Patient Instructions (Addendum)
   Ideal BP -   110-130/ 60-80    want BP < 140/90     Medications changes include :   increase lisinopril  to 2.5 mg twice daily  We will look into prolia for your bones   A referral was ordered neurology and someone will call you to schedule an appointment.   A Holter monitor was ordered

## 2024-02-03 NOTE — Assessment & Plan Note (Signed)
 New Since the hospital she has been experiencing palpitations in addition to elevated blood pressure readings Will get a Holter monitor to rule out atrial fibrillation EKG from hospital showed sinus arrhythmia, LAE, LVH Continue lisinopril -Will adjust dose based on blood pressure

## 2024-02-04 ENCOUNTER — Ambulatory Visit: Payer: Self-pay

## 2024-02-04 ENCOUNTER — Other Ambulatory Visit: Payer: Self-pay

## 2024-02-04 DIAGNOSIS — M810 Age-related osteoporosis without current pathological fracture: Secondary | ICD-10-CM

## 2024-02-04 MED ORDER — DENOSUMAB 60 MG/ML ~~LOC~~ SOSY
60.0000 mg | PREFILLED_SYRINGE | Freq: Once | SUBCUTANEOUS | Status: AC
  Administered 2024-02-18: 60 mg via SUBCUTANEOUS

## 2024-02-04 NOTE — Telephone Encounter (Signed)
 FYI Only or Action Required?: FYI only for provider.  Patient was last seen in primary care on 02/03/2024 by Geofm Glade PARAS, MD.  Called Nurse Triage reporting Hypertension.  Symptoms began a week ago.  Interventions attempted: Prescription medications: lisinopril .  Symptoms are: unchanged.  Triage Disposition: See PCP When Office is Open (Within 3 Days)  Patient/caregiver understands and will follow disposition?: Yes  Copied from CRM #[id number]. Topic: Clinical - Medical Advice >> Feb 04, 2024  9:59 AM Mesmerise C wrote: Reason for CRM: Patient's BP earlier was 163/103 and 163/82 she's been taking a half of a lisinopril  for 7 years but her BP has been getting high she's inquiring if she should take the other half to make it go down Reason for Disposition  Systolic BP >= 160 OR Diastolic >= 100  Answer Assessment - Initial Assessment Questions Pt states for the last two week she's been having issues. Had TIA r/o last week. But states over the last two weeks her blood pressures have been all over the place. Takes lisinopril  half tab 2.5mg  daily. Wondering if she needs bp meds adjusted.   1. BLOOD PRESSURE: What is your blood pressure? Did you take at least two measurements 5 minutes apart?     163/103; 163/82 2. ONSET: When did you take your blood pressure?     10 am 3. HOW: How did you take your blood pressure? (e.g., automatic home BP monitor, visiting nurse)     Bp monitor at home  4. HISTORY: Do you have a history of high blood pressure?     yes 5. MEDICINES: Are you taking any medicines for blood pressure? Have you missed any doses recently?     Lisinopril , no missed doses 6. OTHER SYMPTOMS: Do you have any symptoms? (e.g., blurred vision, chest pain, difficulty breathing, headache, weakness)     denies  Protocols used: Blood Pressure - High-A-AH

## 2024-02-04 NOTE — Telephone Encounter (Signed)
 I want her to take 2.5 mg of the lisinopril  twice daily every day and let us  know how her BP is

## 2024-02-05 ENCOUNTER — Ambulatory Visit (INDEPENDENT_AMBULATORY_CARE_PROVIDER_SITE_OTHER): Admitting: Family Medicine

## 2024-02-05 ENCOUNTER — Encounter: Payer: Self-pay | Admitting: Family Medicine

## 2024-02-05 ENCOUNTER — Other Ambulatory Visit (HOSPITAL_COMMUNITY): Payer: Self-pay

## 2024-02-05 ENCOUNTER — Telehealth: Payer: Self-pay

## 2024-02-05 VITALS — BP 120/82 | HR 54 | Temp 98.0°F | Ht 64.0 in | Wt 108.2 lb

## 2024-02-05 DIAGNOSIS — R946 Abnormal results of thyroid function studies: Secondary | ICD-10-CM | POA: Diagnosis not present

## 2024-02-05 DIAGNOSIS — I10 Essential (primary) hypertension: Secondary | ICD-10-CM

## 2024-02-05 DIAGNOSIS — M810 Age-related osteoporosis without current pathological fracture: Secondary | ICD-10-CM

## 2024-02-05 DIAGNOSIS — R2990 Unspecified symptoms and signs involving the nervous system: Secondary | ICD-10-CM | POA: Diagnosis not present

## 2024-02-05 DIAGNOSIS — G4709 Other insomnia: Secondary | ICD-10-CM | POA: Diagnosis not present

## 2024-02-05 LAB — COMPREHENSIVE METABOLIC PANEL WITH GFR
ALT: 12 U/L (ref 0–35)
AST: 17 U/L (ref 0–37)
Albumin: 4.7 g/dL (ref 3.5–5.2)
Alkaline Phosphatase: 52 U/L (ref 39–117)
BUN: 13 mg/dL (ref 6–23)
CO2: 30 meq/L (ref 19–32)
Calcium: 9.5 mg/dL (ref 8.4–10.5)
Chloride: 99 meq/L (ref 96–112)
Creatinine, Ser: 0.73 mg/dL (ref 0.40–1.20)
GFR: 78.4 mL/min (ref >60.00)
Glucose, Bld: 92 mg/dL (ref 70–99)
Potassium: 4.4 meq/L (ref 3.5–5.1)
Sodium: 136 meq/L (ref 135–145)
Total Bilirubin: 0.6 mg/dL (ref 0.2–1.2)
Total Protein: 7.1 g/dL (ref 6.0–8.3)

## 2024-02-05 LAB — CBC WITH DIFFERENTIAL/PLATELET
Basophils Absolute: 0.1 K/uL (ref 0.0–0.1)
Basophils Relative: 1.1 % (ref 0.0–3.0)
Eosinophils Absolute: 0 K/uL (ref 0.0–0.7)
Eosinophils Relative: 0.5 % (ref 0.0–5.0)
HCT: 40.5 % (ref 36.0–46.0)
Hemoglobin: 13.7 g/dL (ref 12.0–15.0)
Lymphocytes Relative: 21.7 % (ref 12.0–46.0)
Lymphs Abs: 1.1 K/uL (ref 0.7–4.0)
MCHC: 33.7 g/dL (ref 30.0–36.0)
MCV: 97.3 fl (ref 78.0–100.0)
Monocytes Absolute: 0.4 K/uL (ref 0.1–1.0)
Monocytes Relative: 8.8 % (ref 3.0–12.0)
Neutro Abs: 3.3 K/uL (ref 1.4–7.7)
Neutrophils Relative %: 67.9 % (ref 43.0–77.0)
Platelets: 253 K/uL (ref 150.0–400.0)
RBC: 4.16 Mil/uL (ref 3.87–5.11)
RDW: 12.8 % (ref 11.5–15.5)
WBC: 4.9 K/uL (ref 4.0–10.5)

## 2024-02-05 LAB — C-REACTIVE PROTEIN: CRP: <0.5 mg/dL (ref 0.5–20.0)

## 2024-02-05 LAB — SEDIMENTATION RATE: Sed Rate: 6 mm/h (ref 0–30)

## 2024-02-05 LAB — VITAMIN D 25 HYDROXY (VIT D DEFICIENCY, FRACTURES): VITD: 48.29 ng/mL (ref 30.00–100.00)

## 2024-02-05 NOTE — Telephone Encounter (Signed)
 Prolia  VOB initiated via [email or web address]  Next Prolia  inj DUE: NEW START

## 2024-02-05 NOTE — Telephone Encounter (Signed)
 MEDICAL PA SUBMITTED VIA [employer]. Authorization Number : [id number]  PHARMACY COPAY: [id number]

## 2024-02-05 NOTE — Progress Notes (Signed)
 Acute Office Visit  Subjective:     Patient ID: Margaret Craig, female    DOB: [birth year], 78 y.o.   MRN: [id number]  Chief Complaint  Patient presents with   Acute Visit    Pt having issues with BP, readings have been up and down,BP med change yesterday by Dr. Geofm    HPI  Discussed the use of AI scribe software for clinical note transcription with the patient, who gave verbal consent to proceed.  History of Present Illness Margaret Craig is a 78 year old female with hypertension who presents with fluctuating blood pressure readings and recent neurological symptoms.  Blood pressure fluctuations and antihypertensive therapy - Systolic blood pressure readings range from 97 mmHg to 179 mmHg - Lisinopril  dosage recently increased to a full tablet twice daily - Concern for hypotension, particularly after a systolic reading of 97 mmHg at 4:45 AM - Continues to take medication as prescribed - No headaches, swelling, vision changes, or urinary symptoms - Family history of hypertension - Reduced coffee intake  Acute neurological symptoms - Last week experienced confusion and difficulty walking after consuming two glasses of wine - Friend observed slurred speech and facial asymmetry - Presented to hospital for evaluation - Extensive testing, including MRIs and neurological evaluations, showed no evidence of stroke or TIA - Remains concerned about the episode - Abstaining from wine since the incident  Sleep disturbance - Poor sleep, worsened since husband's passing - Tried melatonin without significant improvement  Unintentional weight loss - Recent weight loss of five pounds attributed to stress and changes in routine     ROS Per HPI      Objective:    BP 120/82 (BP Location: Left Arm, Patient Position: Sitting)   Pulse (!) 54   Temp 98 F (36.7 C) (Oral)   Ht 5' 4 (1.626 m)   Wt 108 lb 3.2 oz (49.1 kg)   SpO2 97%   BMI 18.57 kg/m     Physical Exam Vitals and nursing note reviewed.  Constitutional:      General: She is not in acute distress.    Appearance: Normal appearance. She is normal weight.  HENT:     Head: Normocephalic and atraumatic.     Right Ear: External ear normal.     Left Ear: External ear normal.     Nose: Nose normal.     Mouth/Throat:     Mouth: Mucous membranes are moist.     Pharynx: Oropharynx is clear.  Eyes:     Extraocular Movements: Extraocular movements intact.     Pupils: Pupils are equal, round, and reactive to light.  Cardiovascular:     Rate and Rhythm: Normal rate and regular rhythm.     Pulses: Normal pulses.     Heart sounds: Normal heart sounds.  Pulmonary:     Effort: Pulmonary effort is normal. No respiratory distress.     Breath sounds: Normal breath sounds. No wheezing, rhonchi or rales.  Musculoskeletal:        General: Normal range of motion.     Cervical back: Normal range of motion.     Right lower leg: No edema.     Left lower leg: No edema.  Lymphadenopathy:     Cervical: No cervical adenopathy.  Neurological:     General: No focal deficit present.     Mental Status: She is alert and oriented to person, place, and time.  Psychiatric:        Mood  and Affect: Mood normal.        Thought Content: Thought content normal.     Results for orders placed or performed in visit on 02/05/24  CBC with Differential/Platelet  Result Value Ref Range   WBC 4.9 4.0 - 10.5 K/uL   RBC 4.16 3.87 - 5.11 Mil/uL   Hemoglobin 13.7 12.0 - 15.0 g/dL   HCT 59.4 63.9 - 53.9 %   MCV 97.3 78.0 - 100.0 fl   MCHC 33.7 30.0 - 36.0 g/dL   RDW 87.1 88.4 - 84.4 %   Platelets 253.0 150.0 - 400.0 K/uL   Neutrophils Relative % 67.9 43.0 - 77.0 %   Lymphocytes Relative 21.7 12.0 - 46.0 %   Monocytes Relative 8.8 3.0 - 12.0 %   Eosinophils Relative 0.5 0.0 - 5.0 %   Basophils Relative 1.1 0.0 - 3.0 %   Neutro Abs 3.3 1.4 - 7.7 K/uL   Lymphs Abs 1.1 0.7 - 4.0 K/uL   Monocytes  Absolute 0.4 0.1 - 1.0 K/uL   Eosinophils Absolute 0.0 0.0 - 0.7 K/uL   Basophils Absolute 0.1 0.0 - 0.1 K/uL  Comprehensive metabolic panel with GFR  Result Value Ref Range   Sodium 136 135 - 145 mEq/L   Potassium 4.4 3.5 - 5.1 mEq/L   Chloride 99 96 - 112 mEq/L   CO2 30 19 - 32 mEq/L   Glucose, Bld 92 70 - 99 mg/dL   BUN 13 6 - 23 mg/dL   Creatinine, Ser 9.26 0.40 - 1.20 mg/dL   Total Bilirubin 0.6 0.2 - 1.2 mg/dL   Alkaline Phosphatase 52 39 - 117 U/L   AST 17 0 - 37 U/L   ALT 12 0 - 35 U/L   Total Protein 7.1 6.0 - 8.3 g/dL   Albumin 4.7 3.5 - 5.2 g/dL   GFR 21.59 >39.99 mL/min   Calcium  9.5 8.4 - 10.5 mg/dL  Sedimentation rate  Result Value Ref Range   Sed Rate 6 0 - 30 mm/hr  C-reactive protein  Result Value Ref Range   CRP <0.5 0.5 - 20.0 mg/dL  VITAMIN D  25 Hydroxy (Vit-D Deficiency, Fractures)  Result Value Ref Range   VITD 48.29 30.00 - 100.00 ng/mL  PTH, intact and calcium   Result Value Ref Range   PTH 16 16 - 77 pg/mL   Calcium  9.7 8.6 - 10.4 mg/dL        Assessment & Plan:   Assessment and Plan Assessment & Plan Recent episode of transient neurological symptoms, stroke ruled out Confusion, difficulty walking, and slurred speech with stroke and TIA excluded. Possible stress, blood pressure fluctuations, thyroid  or parathyroid issues. - Order inflammatory markers.  Primary hypertension with recent labile blood pressure Blood pressure fluctuates from 97 to 179 systolic. Lisinopril  increased to twice daily. Concerns about hypotension. Stress, caffeine, and sleep may contribute. - Continue lisinopril , one tablet in the morning, half in the evening if BP is 120/80 or lower. - Reduce caffeine to one cup daily. - Consider stress management.  Abnormal thyroid  function tests Slightly low TSH. Previous thyroid  evaluations normal. Possible impact on BP and neurological symptoms. - Order thyroid  function tests. - Hold biotin supplements 3-4 days before  rechecking.  Hypocalcemia Slightly low calcium  despite Citracal and yogurt intake. Possible impact on neurological symptoms. - Order serum calcium  and parathyroid hormone levels. - Check vitamin D  levels.  Insomnia Difficulty sleeping, waking early. Melatonin tried with limited success. Stress and anxiety may contribute. - Avoid Aleve PM  due to Benadryl risks.  Age-related osteoporosis without current pathologic fracture Stable, continue Prolia     Orders Placed This Encounter  Procedures   CBC with Differential/Platelet    Release to patient:   Immediate [1]   Comprehensive metabolic panel with GFR    Release to patient:   Immediate [1]   Sedimentation rate   C-reactive protein   VITAMIN D  25 Hydroxy (Vit-D Deficiency, Fractures)   PTH, intact and calcium    TSH+T3+ThyAbs+TPO Ab+TRAb+T...     No orders of the defined types were placed in this encounter.   Return if symptoms worsen or fail to improve.  Corean LITTIE Ku, FNP

## 2024-02-05 NOTE — Patient Instructions (Addendum)
 One whole tab of lisinopril  in the morning and half tablet in the evening.   We are checking labs today, will be in contact with any results that require further attention.  Follow-up with me for new or worsening symptoms.

## 2024-02-05 NOTE — Telephone Encounter (Signed)
 SABRA

## 2024-02-06 ENCOUNTER — Other Ambulatory Visit (HOSPITAL_COMMUNITY): Payer: Self-pay

## 2024-02-06 ENCOUNTER — Ambulatory Visit: Payer: Self-pay | Admitting: Family Medicine

## 2024-02-06 ENCOUNTER — Telehealth: Payer: Self-pay

## 2024-02-06 NOTE — Telephone Encounter (Signed)
 Pt ready for scheduling for Prolia on or after : 02/06/24  Option# 1: Buy/Bill (Office supplied medication)  Out-of-pocket cost due at time of clinic visit: $357  Number of injection/visits approved:   Primary: [employer] Prolia co-insurance: 20% Admin fee co-insurance: 20%  Secondary: N/A Prolia co-insurance:  Admin fee co-insurance:   Medical Benefit Details: Date Benefits were checked: 02/05/24 Deductible: no/ Coinsurance: 20%/ Admin Fee: 20%  Prior Auth: Approved PA# [id number] Expiration Date: 02/05/24 to 02/04/25  # of doses approved: ----------------------------------------------------------------------- Option# 2- Med Obtained from pharmacy:  Pharmacy benefit: Copay $645.79 (Paid to pharmacy) Admin Fee: 20% (Pay at clinic)  Prior Auth: Approved PA# [id number] Expiration Date: 02/05/24 to 02/04/25  # of doses approved:   If patient wants fill through the pharmacy benefit please send prescription to: [facility], and include estimated need by date in rx notes. Pharmacy will ship medication directly to the office.  Patient not eligible for Prolia Copay Card. Copay Card can make patient's cost as little as $25. Link to apply: [email or web address]://[email or web address]  ** This summary of benefits is an estimation of the patient's out-of-pocket cost. Exact cost may very based on individual plan coverage.

## 2024-02-06 NOTE — Telephone Encounter (Signed)
 Pharmacy Patient Advocate Encounter  Received notification from [facility] that Prior Authorization for Prolia has been APPROVED from 02/05/24 to 02/04/25   PA #/Case ID/Reference #: [id number]    Approval letter indexed to media tab

## 2024-02-06 NOTE — Telephone Encounter (Signed)
 Copied from CRM #[id number]. Topic: Clinical - Medical Advice >> Feb 06, 2024 12:02 PM Turkey A wrote: Reason for CRM: Patient received heart monitor but does not know how long to wear it? Has questions about Bone Density Test and there is a prescription at [facility] that she does not know what it is for. Please contact

## 2024-02-07 LAB — PTH, INTACT AND CALCIUM
Calcium: 9.7 mg/dL (ref 8.6–10.4)
PTH: 16 pg/mL (ref 16–77)

## 2024-02-09 DIAGNOSIS — R946 Abnormal results of thyroid function studies: Secondary | ICD-10-CM | POA: Insufficient documentation

## 2024-02-09 DIAGNOSIS — R2990 Unspecified symptoms and signs involving the nervous system: Secondary | ICD-10-CM | POA: Insufficient documentation

## 2024-02-12 MED ORDER — LISINOPRIL 5 MG PO TABS: ORAL_TABLET | ORAL | Status: DC

## 2024-02-12 NOTE — Telephone Encounter (Signed)
 I want her to take her BP only twice a day - once before breakfast and once before dinner.     Record her HR and BP - f/u with me in 2 weeks   BP readings are not ideal but not concerning.   Continue current dose for now.

## 2024-02-12 NOTE — Telephone Encounter (Signed)
 Spoke with patient today.

## 2024-02-12 NOTE — Addendum Note (Signed)
 Addended by: GEOFM GLADE PARAS on: 02/12/2024 10:18 AM   Modules accepted: Orders

## 2024-02-17 DIAGNOSIS — R002 Palpitations: Secondary | ICD-10-CM | POA: Diagnosis not present

## 2024-02-18 ENCOUNTER — Ambulatory Visit (INDEPENDENT_AMBULATORY_CARE_PROVIDER_SITE_OTHER)

## 2024-02-18 DIAGNOSIS — M810 Age-related osteoporosis without current pathological fracture: Secondary | ICD-10-CM | POA: Diagnosis not present

## 2024-02-18 MED ORDER — DENOSUMAB 60 MG/ML ~~LOC~~ SOSY: 60.0000 mg | PREFILLED_SYRINGE | SUBCUTANEOUS | Status: AC

## 2024-02-18 NOTE — Progress Notes (Signed)
 After obtaining consent, and per orders of Dr. Geofm, injection of Prolia given by Ronnald SHAUNNA Palms. Patient instructed to r to report any adverse reaction to me immediately.

## 2024-02-19 DIAGNOSIS — R002 Palpitations: Secondary | ICD-10-CM | POA: Diagnosis not present

## 2024-02-20 DIAGNOSIS — G8929 Other chronic pain: Secondary | ICD-10-CM | POA: Diagnosis not present

## 2024-02-20 DIAGNOSIS — M545 Low back pain, unspecified: Secondary | ICD-10-CM | POA: Diagnosis not present

## 2024-02-20 LAB — TSH+T3+THYABS+TPO AB+TRAB+T...
Anti-Thyroglobulin Antibodies: <1 [IU]/mL
Anti-Thyroid Peroxidase Ab: <9 [IU]/mL
Free T-3: 3.1 pg/mL
Free T4 by Dialysis: 1.5 ng/dL
Reverse T3,  LCMS Endo Sci: 17.4 ng/dL
TSH Receptor Antibody (TBII): <0.3 U/L
TSH: 0.43 uU/mL
Triiodothyronine (T-3), Serum: 96 ng/dL

## 2024-02-22 ENCOUNTER — Ambulatory Visit: Payer: Self-pay | Admitting: Internal Medicine

## 2024-02-23 NOTE — Progress Notes (Unsigned)
      Subjective:    Patient ID: Margaret Craig, female    DOB: November 21, 1945, 78 y.o.   MRN: [id number]     HPI Margaret Craig is here for follow up of her chronic medical problems.  BP was elevated - medications adjusted. Currently taking  Lisinopril  1 tab in am, 1 tab in pm ( 1/2 if BP is 120/80 or lower)  Medications and allergies reviewed with patient and updated if appropriate.  Current Outpatient Medications on File Prior to Visit  Medication Sig Dispense Refill   ascorbic acid (VITAMIN C ) 500 MG tablet Take 500 mg by mouth daily.     BIOTIN PO Take 1 capsule by mouth daily.     Calcium -Magnesium-Vitamin D  (CITRACAL CALCIUM  +D3 PO) Take 1 tablet by mouth in the morning and at bedtime.     EPINEPHrine  0.3 mg/0.3 mL IJ SOAJ injection INJECT INTO THE MIDDLE OF THE OUTER THIGH AND HOLD FOR 10 SECONDS AS NEEDED FOR SEVERE ALLERGIC REACTION THEN CALL 911 IF USED (Patient taking differently: Inject 0.3 mg into the muscle See admin instructions. INJECT 0.3 MG INTO THE MIDDLE OF THE OUTER THIGH AND HOLD FOR 10 SECONDS AS NEEDED FOR A SEVERE ALLERGIC REACTION. THEN, CALL 911 IF USED.) 2 each 2   lisinopril  (ZESTRIL ) 5 MG tablet Take 5 mg in morning and 2.5 mg in evening     Multiple Vitamins-Minerals (MULTIVITAMIN WITH MINERALS) tablet Take 1 tablet by mouth daily with breakfast.     TYLENOL  500 MG tablet Take 500-1,000 mg by mouth every 8 (eight) hours as needed for mild pain (pain score 1-3) (or headaches).     Zinc  30 MG CAPS Take 30 mg by mouth daily.     Current Facility-Administered Medications on File Prior to Visit  Medication Dose Route Frequency Provider Last Rate Last Admin   [START ON 03/03/2024] denosumab (PROLIA) injection 60 mg  60 mg Subcutaneous Q6 months Bodi Palmeri, Glade PARAS, MD         Review of Systems     Objective:  There were no vitals filed for this visit. BP Readings from Last 3 Encounters:  02/05/24 120/82  02/03/24 116/78  01/30/24 (!) 142/92   Wt  Readings from Last 3 Encounters:  02/05/24 108 lb 3.2 oz (49.1 kg)  02/03/24 108 lb (49 kg)  01/29/24 110 lb (49.9 kg)   There is no height or weight on file to calculate BMI.    Physical Exam     Lab Results  Component Value Date   WBC 4.9 02/05/2024   HGB 13.7 02/05/2024   HCT 40.5 02/05/2024   PLT 253.0 02/05/2024   GLUCOSE 92 02/05/2024   CHOL 215 (H) 01/30/2024   TRIG 85 01/30/2024   HDL 91 01/30/2024   LDLCALC 107 (H) 01/30/2024   ALT 12 02/05/2024   AST 17 02/05/2024   NA 136 02/05/2024   K 4.4 02/05/2024   CL 99 02/05/2024   CREATININE 0.73 02/05/2024   BUN 13 02/05/2024   CO2 30 02/05/2024   TSH 0.224 (L) 01/29/2024   HGBA1C 5.0 01/29/2024     Assessment & Plan:    See Problem List for Assessment and Plan of chronic medical problems.

## 2024-02-24 ENCOUNTER — Ambulatory Visit (INDEPENDENT_AMBULATORY_CARE_PROVIDER_SITE_OTHER): Admitting: Internal Medicine

## 2024-02-24 ENCOUNTER — Encounter: Payer: Self-pay | Admitting: Internal Medicine

## 2024-02-24 VITALS — BP 128/74 | HR 72 | Temp 98.0°F | Ht 64.0 in | Wt 109.0 lb

## 2024-02-24 DIAGNOSIS — I471 Supraventricular tachycardia, unspecified: Secondary | ICD-10-CM | POA: Insufficient documentation

## 2024-02-24 DIAGNOSIS — I10 Essential (primary) hypertension: Secondary | ICD-10-CM

## 2024-02-24 MED ORDER — LISINOPRIL 5 MG PO TABS: ORAL_TABLET | ORAL | 3 refills | Status: AC

## 2024-02-24 NOTE — Assessment & Plan Note (Signed)
 Chronic Intermittent palpitations Recent Holter monitor showed runs of SVT that were short No concerning rhythms These palpitations are not concerning to her so no treatment is necessary

## 2024-02-24 NOTE — Assessment & Plan Note (Signed)
 Chronic BP well-controlled, but likely to controlled Continue lisinopril  5 mg daily morning Discontinue lisinopril  2.5 mg in evening-can take if SBP more than 150 Continue to monitor BP at home

## 2024-02-24 NOTE — Patient Instructions (Addendum)
     Medications changes include :   decrease lisinopril  to one pill in the morning.  Take a 1/2 pill as needed

## 2024-02-25 ENCOUNTER — Encounter: Payer: Self-pay | Admitting: Gastroenterology

## 2024-02-25 ENCOUNTER — Ambulatory Visit: Admitting: Gastroenterology

## 2024-02-25 VITALS — BP 118/72 | HR 66 | Ht 63.0 in | Wt 109.1 lb

## 2024-02-25 DIAGNOSIS — Z800 Family history of malignant neoplasm of digestive organs: Secondary | ICD-10-CM

## 2024-02-25 DIAGNOSIS — Z860100 Personal history of colon polyps, unspecified: Secondary | ICD-10-CM

## 2024-02-25 NOTE — Progress Notes (Signed)
 02/25/2024 Margaret Craig [id number] 11-20-1945   Discussed the use of AI scribe software for clinical note transcription with the patient, who gave verbal consent to proceed.  History of Present Illness Margaret Craig is a 78 year old female who presents for a follow-up colonoscopy.  Previously a patient of Dr. Albin.  She is here to discuss scheduling a follow-up colonoscopy. Her last colonoscopy was in October 2020, during which three polyps were removed. She is not currently on any blood thinners. She wishes to schedule the procedure on a Monday in January or February to accommodate her daughter's schedule.  Approximately one month ago, she experienced an episode where her legs collapsed after standing up from dinner. She was taken to a friend's house and subsequently evaluated for a possible stroke or TIA. She underwent a comprehensive neurological workup, including a brain MRI and CT, and was told by the neurologist that she did not have a stroke or TIA. She spent the night in the hospital and was seen by a neurologist. She reported that her blood pressure had been fluctuating, and her medication dosage was changed at that time.  No chest pain, shortness of breath, blood in stool, or abdominal pain. Her bowel movements are regular.  Her family history is significant for colon cancer, with her aunt dying from it in her early 69 and her mother also succumbing to colon cancer after regular colonoscopies every five years until age 23.   Colonoscopy 01/2019: - Two 7 mm polyps in the sigmoid colon and in the transverse colon, removed with a cold snare. Resected and retrieved. - One 4 mm polyp in the descending colon, removed with a cold biopsy forceps. Resected and retrieved. - Mild diverticulosis in the left colon. - External and internal hemorrhoids. - Otherwise normal appearing.  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 - INFLAMMATORY POLYP   Past Medical History:  Diagnosis Date   Allergy    Arthritis    Colon polyps    Fainting spell    Hypertension    PONV (postoperative nausea and vomiting)    Thyroid  disease    UTI (urinary tract infection)    Past Surgical History:  Procedure Laterality Date   ANTERIOR LAT LUMBAR FUSION Left 06/04/2023   Procedure: LEFT-SIDED LATERAL INTERBODY FUSION LUMBAR FOUR - LUMBAR FIVE WITH INSTRUMENTATION AND ALLOGRAFT;  Surgeon: Beuford Anes, MD;  Location: [facility] OR;  Service: Orthopedics;  Laterality: Left;   BACK SURGERY  2025   COLONOSCOPY     POSTERIOR FUSION PEDICLE SCREW PLACEMENT N/A 06/04/2023   Procedure: POSTERIOR SPINAL FUSION LUMBAR FOUR - LUMBAR FIVE WITH INSTRUMENTATION AND ALLOGRAFT;  Surgeon: Beuford Anes, MD;  Location: [facility] OR;  Service: Orthopedics;  Laterality: N/A;   SINUS IRRIGATION     TONSILLECTOMY      reports that she has never smoked. She has never used smokeless tobacco. She reports current alcohol use of about 2.0 standard drinks of alcohol per week. She reports that she does not use drugs. family history includes Alcohol abuse in her paternal uncle and son; Breast cancer in her maternal aunt; Cancer in her maternal grandmother; Colon cancer in her maternal aunt; Colon cancer (age of onset: 36) in her mother; Heart disease in her maternal aunt, maternal grandfather, and paternal aunt; Mental illness in her mother and son. Allergies  Allergen Reactions   Wasp Venom Anaphylaxis   Codeine Other (See Comments)  Can't think straight and insomnia       Outpatient Encounter Medications as of 02/25/2024  Medication Sig   ascorbic acid (VITAMIN C ) 500 MG tablet Take 500 mg by mouth daily.   BIOTIN PO Take 1 capsule by mouth daily.   Calcium -Magnesium-Vitamin D  (CITRACAL CALCIUM  +D3 PO) Take 1 tablet by mouth in the morning and at bedtime.   EPINEPHrine  0.3 mg/0.3 mL IJ SOAJ injection  INJECT INTO THE MIDDLE OF THE OUTER THIGH AND HOLD FOR 10 SECONDS AS NEEDED FOR SEVERE ALLERGIC REACTION THEN CALL 911 IF USED (Patient taking differently: Inject 0.3 mg into the muscle See admin instructions. INJECT 0.3 MG INTO THE MIDDLE OF THE OUTER THIGH AND HOLD FOR 10 SECONDS AS NEEDED FOR A SEVERE ALLERGIC REACTION. THEN, CALL 911 IF USED.)   lisinopril  (ZESTRIL ) 5 MG tablet Take 5 mg in morning.  Take 2.5 mg in evening prn if SBP > 150   Multiple Vitamins-Minerals (MULTIVITAMIN WITH MINERALS) tablet Take 1 tablet by mouth daily with breakfast.   TYLENOL  500 MG tablet Take 500-1,000 mg by mouth every 8 (eight) hours as needed for mild pain (pain score 1-3) (or headaches).   Zinc  30 MG CAPS Take 30 mg by mouth daily.   Facility-Administered Encounter Medications as of 02/25/2024  Medication   [START ON 03/03/2024] denosumab (PROLIA) injection 60 mg     REVIEW OF SYSTEMS  : All other systems reviewed and negative except where noted in the History of Present Illness.   PHYSICAL EXAM: BP 118/72   Pulse 66   Ht 5' 3 (1.6 m)   Wt 109 lb 1 oz (49.5 kg)   BMI 19.32 kg/m  General: Well developed Mcmaster female in no acute distress Head: Normocephalic and atraumatic Eyes:  Sclerae anicteric, conjunctiva pink. Ears: Normal auditory acuity Lungs: Clear throughout to auscultation; no W/R/R. Heart: Regular rate and rhythm; no M/R/G. Rectal:  Will be done at the time of colonoscopy. Musculoskeletal: Symmetrical with no gross deformities  Skin: No lesions on visible extremities Extremities: No edema  Neurological: Alert oriented x 4, grossly non-focal Psychological:  Alert and cooperative. Normal mood and affect Assessment & Plan Colorectal cancer screening with colonoscopy in patient with personal history of colonic polyps and FH of colon cancer in her mother > age 41 Colorectal cancer screening is indicated due to personal history of colonic polyps. Last colonoscopy in October 2020.  -  Will schedule colonoscopy with Dr. Nandigam.  Patient wants a Monday in January.  The risks, benefits, and alternatives to colonoscopy were discussed with the patient and she consents to proceed.    CC:  Geofm Glade PARAS, MD

## 2024-02-25 NOTE — Patient Instructions (Signed)
 Colonoscopy in January will call back to schedule next week.   _______________________________________________________  If your blood pressure at your visit was 140/90 or greater, please contact your primary care physician to follow up on this.  _______________________________________________________  If you are age 79 or older, your body mass index should be between 23-30. Your Body mass index is 19.32 kg/m. If this is out of the aforementioned range listed, please consider follow up with your Primary Care Provider.  If you are age 67 or younger, your body mass index should be between 19-25. Your Body mass index is 19.32 kg/m. If this is out of the aformentioned range listed, please consider follow up with your Primary Care Provider.   ________________________________________________________  The [facility] providers would like to encourage you to use MYCHART to communicate with providers for non-urgent requests or questions.  Due to long hold times on the telephone, sending your provider a message by [facility] may be a faster and more efficient way to get a response.  Please allow 48 business hours for a response.  Please remember that this is for non-urgent requests.  _______________________________________________________  [facility] is using a team-based approach to care.  Your team is made up of your doctor and two to three APPS. Our APPS (Nurse Practitioners and Physician Assistants) work with your physician to ensure care continuity for you. They are fully qualified to address your health concerns and develop a treatment plan. They communicate directly with your gastroenterologist to care for you. Seeing the Advanced Practice Practitioners on your physician's team can help you by facilitating care more promptly, often allowing for earlier appointments, access to diagnostic testing, procedures, and other specialty referrals.

## 2024-03-03 ENCOUNTER — Telehealth: Payer: Self-pay | Admitting: *Deleted

## 2024-03-03 DIAGNOSIS — Z800 Family history of malignant neoplasm of digestive organs: Secondary | ICD-10-CM

## 2024-03-03 DIAGNOSIS — Z860100 Personal history of colon polyps, unspecified: Secondary | ICD-10-CM

## 2024-03-03 MED ORDER — NA SULFATE-K SULFATE-MG SULF 17.5-3.13-1.6 GM/177ML PO SOLN: 1.0000 | Freq: Once | ORAL | 0 refills | Status: AC

## 2024-03-03 NOTE — Telephone Encounter (Signed)
 Spoke with patient and scheduled 05/04/23 at 8 am.

## 2024-03-03 NOTE — Telephone Encounter (Signed)
-----   Message from [facility] sent at 02/25/2024  9:00 AM EST ----- Regarding: schedule colonoscopy Patient needs colonoscopy Jan 2026, Monday morning

## 2024-04-01 ENCOUNTER — Ambulatory Visit: Admitting: "Endocrinology

## 2024-04-19 ENCOUNTER — Telehealth: Payer: Self-pay | Admitting: Gastroenterology

## 2024-04-19 NOTE — Telephone Encounter (Signed)
 Patient called requesting to know if her prep medication will still be okay for 2/17 colonoscopy. States she picked it up in the early part of November. Please advise, thank you

## 2024-04-20 NOTE — Telephone Encounter (Signed)
 Patient informed there is a expiration date on the Suprep box. She will let us  know if she needs a new one.

## 2024-05-03 ENCOUNTER — Encounter: Admitting: Gastroenterology

## 2024-05-12 ENCOUNTER — Ambulatory Visit: Admitting: Neurology

## 2024-05-19 ENCOUNTER — Encounter: Payer: Self-pay | Admitting: Gastroenterology

## 2024-06-08 ENCOUNTER — Encounter: Admitting: Gastroenterology

## 2024-06-28 ENCOUNTER — Encounter

## 2024-07-12 ENCOUNTER — Encounter: Admitting: Gastroenterology

## 2024-09-17 ENCOUNTER — Ambulatory Visit
